# Patient Record
Sex: Female | Born: 1965 | Race: White | Hispanic: No | Marital: Married | Smoking: Current every day smoker
Health system: Southern US, Community
[De-identification: ages and names within clinical notes are randomized; demographics above are authoritative.]

## PROBLEM LIST (undated history)

## (undated) DIAGNOSIS — M51369 Other intervertebral disc degeneration, lumbar region without mention of lumbar back pain or lower extremity pain: Secondary | ICD-10-CM

## (undated) DIAGNOSIS — E559 Vitamin D deficiency, unspecified: Secondary | ICD-10-CM

## (undated) DIAGNOSIS — F329 Major depressive disorder, single episode, unspecified: Secondary | ICD-10-CM

## (undated) DIAGNOSIS — M1990 Unspecified osteoarthritis, unspecified site: Secondary | ICD-10-CM

## (undated) DIAGNOSIS — G5600 Carpal tunnel syndrome, unspecified upper limb: Secondary | ICD-10-CM

## (undated) DIAGNOSIS — M5136 Other intervertebral disc degeneration, lumbar region: Secondary | ICD-10-CM

## (undated) DIAGNOSIS — E785 Hyperlipidemia, unspecified: Secondary | ICD-10-CM

## (undated) DIAGNOSIS — R232 Flushing: Secondary | ICD-10-CM

## (undated) DIAGNOSIS — M5126 Other intervertebral disc displacement, lumbar region: Secondary | ICD-10-CM

## (undated) HISTORY — PX: ABDOMINAL HYSTERECTOMY: SHX81

## (undated) HISTORY — DX: Unspecified osteoarthritis, unspecified site: M19.90

## (undated) HISTORY — DX: Other intervertebral disc displacement, lumbar region: M51.26

## (undated) HISTORY — PX: SALPINGOOPHORECTOMY: SHX82

## (undated) HISTORY — DX: Major depressive disorder, single episode, unspecified: F32.9

## (undated) HISTORY — DX: Flushing: R23.2

## (undated) HISTORY — DX: Other intervertebral disc degeneration, lumbar region without mention of lumbar back pain or lower extremity pain: M51.369

## (undated) HISTORY — DX: Hyperlipidemia, unspecified: E78.5

## (undated) HISTORY — DX: Other intervertebral disc degeneration, lumbar region: M51.36

## (undated) HISTORY — DX: Vitamin D deficiency, unspecified: E55.9

---

## 2001-05-26 ENCOUNTER — Encounter: Payer: Self-pay | Admitting: Obstetrics and Gynecology

## 2001-05-26 ENCOUNTER — Ambulatory Visit (HOSPITAL_COMMUNITY): Admission: RE | Admit: 2001-05-26 | Discharge: 2001-05-26 | Payer: Self-pay | Admitting: Obstetrics and Gynecology

## 2001-06-06 ENCOUNTER — Ambulatory Visit (HOSPITAL_COMMUNITY): Admission: RE | Admit: 2001-06-06 | Discharge: 2001-06-06 | Payer: Self-pay | Admitting: General Surgery

## 2002-01-02 ENCOUNTER — Other Ambulatory Visit: Admission: RE | Admit: 2002-01-02 | Discharge: 2002-01-02 | Payer: Self-pay | Admitting: Obstetrics and Gynecology

## 2007-02-25 ENCOUNTER — Ambulatory Visit (HOSPITAL_COMMUNITY): Admission: RE | Admit: 2007-02-25 | Discharge: 2007-02-25 | Payer: Self-pay | Admitting: Obstetrics and Gynecology

## 2007-05-15 ENCOUNTER — Inpatient Hospital Stay (HOSPITAL_COMMUNITY): Admission: RE | Admit: 2007-05-15 | Discharge: 2007-05-17 | Payer: Self-pay | Admitting: Obstetrics and Gynecology

## 2007-05-15 ENCOUNTER — Encounter: Payer: Self-pay | Admitting: Obstetrics and Gynecology

## 2011-03-13 NOTE — Op Note (Signed)
NAME:  Smyre, Fani                  ACCOUNT NO.:  [id number]   MEDICAL RECORD NO.:  [id number]          PATIENT TYPE:  INP   LOCATION:  A308                          FACILITY:  [facility]   PHYSICIAN:  Tilda Burrow, M.D. DATE OF BIRTH:  03/27/1966   DATE OF PROCEDURE:  DATE OF DISCHARGE:                               OPERATIVE REPORT   PREOPERATIVE DIAGNOSIS:  Pelvic pain, menorrhagia, stress urinary  incontinence.   POSTOPERATIVE DIAGNOSES:  1. Pelvic pain, menorrhagia, stress urinary incontinence.  2. Minimal endometriosis, bladder flap, and left ovary.   PROCEDURE:  Total abdominal hysterectomy with left salpingo-  oophorectomy.   SURGEON:  Tilda Burrow, M.D.   Burch urethropexy, Dr. Dennie Maizes, dictated elsewhere.   ANESTHESIA:  General.   COMPLICATIONS:  None.   FINDINGS:  Mobile uterus with adhesions from enlarged left ovary to the  back of the uterus on the left, visibly normal right tube and ovary with  good mobility.  Cul de sac normal without endometriosis implants.  Bladder flap showing endometriosis, removed with specific.   DESCRIPTION OF PROCEDURE:  Patient was taken to the operating room,  prepped and draped for the combined abdominal and vaginal procedure with  yellow fin low lithotomy leg support.  A Pfannenstiel-type incision was  performed with wide excision of the old cicatrix, removing a 4-5 cm wide  ellipse of skin x30 cm in length.  This allowed improved visibility and  access for the Burch urethropexy.  The Pfannenstiel incision was opened  transversely in a standard fashion, the rectus muscles split in the  midline, the peritoneal cavity entered carefully while elevating the  abdominal wall.  Bowel packed away with three laparotomy drapes, and a  Balfour retractor placed in an inverted position.  The uterus could be  grasped with Lahey thyroid tenaculum, elevated round ligaments taken  down individually, and bladder flap developed, being  careful to leave  the endometriosis implants that had likely occurred at the time of the  cesarean sections, leaving that as a part of the surgical specimen.  The  bladder was dissected down.  There was a line of dense adhesions to the  back of the bladder that required careful dissection, peeling the  bladder off of the anterior uterus.  The left infundibulopelvic ligament  was isolated and cross-clamped well away from the pelvic side wall, then  suture-ligated.  The right utero-ovarian ligament was similarly  isolated, clamped, cut, and suture-ligated with 0 chromic.  The uterine  vessels were then skeletonized, cross-clamped with a curved Heaney  clamp, a Kelly clamp was used for back-bleeding, transected, and 0  chromic suture ligature used for the suturing.  The upper and lower  cardinal ligaments were then serially clamped, cut, and suture-ligated  using a straight Heaney clamp, knife for transection, and 0 chromic  suture ligature.  There were generous varicosities in the bladder flap  area, some of which required point cautery, being careful to stay away  from the bladder and stay on the surface of the lower uterine segment.  We marched down to the  level of the cervix.  A stab incision could be  made in the anterior cervical vaginal fornix, and the cervix amputated  off the vaginal cuff.  The Aldridge stitch on the left side was used to  reattach the lower cardinal ligaments to the vaginal cuff and on the  right side, attachments were more naturally dense.  The vaginal cuff  itself was thick in the posterior area.  A figure-of-eight suture was  placed at 6 o'clock on the cuff to reduce the posterior circumference  and to develop a vaginal pouch posterior to the incision scar that is to  develop.  The remainder of the cuff was closed in a running, locking  fashion using 0 chromic with good tissue approximation.  Two figure-of-  eight sutures overlying the running, locking suture  were necessary for  hemostasis.  Careful attention was made for hemostasis.  There were  several varices that required individual attention with point cautery,  being careful to stay away from the bladder.  Patient then had  inspection of the pelvis.  The right utero-ovarian ligature was re-sewn  due to some oozing and some Bovie cautery used on the mesosalpinx on  this side to achieve adequate hemostasis.  The pelvis was irrigated, and  hemostasis confirmed.  The bladder flap was tacked down with two  interrupted 2-0 chromic sutures overlying the vaginal cuff.  The  laparotomy equipment was removed.  Sponge and needle counts were  correct.  The anterior peritoneum was closed with 2-0 chromic and rectus  muscles left separated so that Burch urethropexy could be developed.  Dr. Rito Ehrlich then took over the surgery, and the Burch urethropexy  procedure was performed, as described in his notes.   Upon completion of Dr. Chancy Milroy portion of the case, I resumed  management by pulling the rectus muscles together, and the fibrous  tissue between the pyramidalis muscles was pulled together with  interrupted 2-0 chromic, and the rectus muscles pulled together  similarly.  The fascia was pulled down, trimmed of some irregular edges,  and then fascia reapproximated with 0 Vicryl in a continuous running  fashion with good tissue edge approximation.  Subcu fatty tissues were  irrigated, confirmed as hemostatic, reapproximated using interrupted 2-0  chromic in two layers in the subcu fatty space with J-P drain placed  just above the fascia and allowed to exit through the incision on the  left side.  Once the 2-0 plain had pulled the fatty tissues into a good  approximation, staple closure of the skin completed the procedure.  Suturing of the J-P drain was done on the left lower quadrant and placed  to bulb suction.  Estimated blood loss 300 cc.      Tilda Burrow, M.D.  Electronically  Signed     JVF/MEDQ  D:  05/15/2007  T:  05/15/2007  Job:  [id number]   cc:   Dennie Maizes, M.D.  Fax: [phone]

## 2011-03-13 NOTE — Discharge Summary (Signed)
NAME:  Judith Rodriguez, Judith Rodriguez                  ACCOUNT NO.:  [id number]   MEDICAL RECORD NO.:  [id number]          PATIENT TYPE:  INP   LOCATION:  A308                          FACILITY:  [facility]   PHYSICIAN:  Lazaro Arms, M.D.   DATE OF BIRTH:  [birth year]   DATE OF ADMISSION:  05/15/2007  DATE OF DISCHARGE:  07/19/2008LH                               DISCHARGE SUMMARY   DISCHARGE DIAGNOSIS:  1. Status post abdominal hysterectomy and Burch urethropexy.  2. Unremarkable postoperative course.   PROCEDURE:  1. Abdominal hysterectomy by Dr. Emelda Fear.  2. Burch urethropexy by Dr. Rito Ehrlich.   Please refer to the history and physical and the surgical note for  details of admission to the hospital.   HOSPITAL COURSE:  The patient was admitted postoperatively.  She had no  complications intraoperatively.  Postop, she has done well, tolerating  clear liquids and a regular diet.  She has voided without symptoms and  is extensively ambulatory.  Her incision is clean, dry, and intact.  Her  abdominal exam is benign.  She has remained afebrile.  Her JP drain is  in place and putting out about 30 mL a day of blood tinged  serosanguineous fluid.  It will be left in place when she is discharged.  The patient is discharged to home on Percocet 5/325, #20, Cipro 500 mg  one a day, a catheter in place, JP in place, and she has requested  Ambien 10 mg for sleep.  The patient is discharged to home to follow up  in the office next Thursday, she is given aftercare instructions and  instructions for contact in the office.      Lazaro Arms, M.D.  Electronically Signed     LHE/MEDQ  D:  05/17/2007  T:  05/17/2007  Job:  [id number]

## 2011-03-13 NOTE — Consult Note (Signed)
NAME:  Judith Rodriguez, Judith Rodriguez                  ACCOUNT NO.:  [id number]   MEDICAL RECORD NO.:  [id number]          PATIENT TYPE:  INP   LOCATION:  A308                          FACILITY:  [facility]   PHYSICIAN:  Dennie Maizes, M.D.   DATE OF BIRTH:  [birth year]   DATE OF CONSULTATION:  05/14/2007  DATE OF DISCHARGE:                                 CONSULTATION   CHIEF COMPLAINT:  Urinary leakage during coughing and sneezing,  menorrhagia, pelvic pain.   HISTORY OF PRESENT ILLNESS:  This 45 year old female was referred to me  by Dr. Emelda Fear.  She had irregular periods as well as left lower  quadrant abdominal pain and pelvic pain.  She had been evaluated by Dr.  Emelda Fear and scheduled to undergo an abdominal hysterectomy.   The patient also has significant mixed type of urinary incontinence.  She has had urinary leakage for the past two years which is getting  worse at present.  She has urinary leakage during coughing, sneezing,  exercise, and sex.  She has urinary frequency times 6-8 and nocturia  times zero.  She has urinary urgency and occasional urge urinary  incontinence.  She feels the stress urinary incontinence is more  troublesome than the urge urinary incontinence.  There is no history of  hematuria, urolithiasis, or urinary tract infections.   PAST MEDICAL HISTORY:  No medical illnesses, status post cesarean  section ten years ago, status post tubal ligation.   SOCIAL HISTORY:  The patient is married.  She has two children, 95 and  36 years old.   MEDICATIONS:  Darvocet and Benadryl.   ALLERGIES:  Aspirin.   FAMILY HISTORY:  The family history is positive for carcinoma of the  lung, other cancer, coronary artery disease, and myocardial infarction.   PHYSICAL EXAMINATION:  VITAL SIGNS:  Height is 5 feet 4 inches, weight  of 155 pounds.  HEENT:  Normal.  NECK:  No masses.  LUNGS:  The lungs are clear to auscultation.  HEART:  Regular rate and rhythm, no murmurs.  ABDOMEN:  The  abdomen is soft.  There is no palpable flank muscle CVA  tenderness.  Mild left lower quadrant abdominal tenderness is noted.  PELVIC:  A small rectocele is noted.  There is no pelvic mass.   INVESTIGATIONS:  The patient has undergone further evaluation in the  office with CMG and cystoscopy.  The first urge of urine is 50 cc.  The  CMG revealed normal bladder sensations and the patient could feel the  filling of the bladder, 145 cc.  She developed a desire to urge at a  volume of 247 cc and the bladder capacity was 305 cc.  There was no  bladder contractions.  The leak point pressure was more than 170 meters  of water and she had urinary leakage at supine as well as upright  positions.  Cystoscopy was essentially normal.  The trigone, ureteral  orifices, and bladder mucosa were unremarkable.   IMPRESSION:  Stress urinary incontinence, urinary urgency, urge  incontinence.   PLAN:  I have discussed with the patient  regarding the treatment options  of mixed type of urinary incontinence.  As she is undergoing an  abdominal hysterectomy, I plan to do a Burch vesico urethropexy at the  same time.  I have informed the patient regarding the diagnosis,  operative details, alternative treatments, outcomes, possible risks and  complications, and she has agreed for the procedure to be done.  Infection, bleeding, postoperative urinary retention, persistent urinary  incontinence were discussed with the patient.  She may need medical  treatment for persistent urinary urgency and urge urinary incontinence  even after the surgery.  She has been informed about this possibility.  Her questions have been answered and she has agreed for the procedure to  be done.      Dennie Maizes, M.D.  Electronically Signed     SK/MEDQ  D:  05/14/2007  T:  05/14/2007  Job:  [id number]   cc:   Dennie Maizes, M.D.  Fax: [phone]   Tilda Burrow, M.D.  Fax: [phone]

## 2011-03-13 NOTE — Op Note (Signed)
NAME:  Judith Rodriguez, Judith Rodriguez                  ACCOUNT NO.:  [id number]   MEDICAL RECORD NO.:  [id number]          PATIENT TYPE:  INP   LOCATION:  A308                          FACILITY:  [facility]   PHYSICIAN:  Dennie Maizes, M.D.   DATE OF BIRTH:  Mar 14, 1966   DATE OF PROCEDURE:  05/15/2007  DATE OF DISCHARGE:                               OPERATIVE REPORT   PREOPERATIVE DIAGNOSES:  Stress urinary incontinence.   POSTOPERATIVE DIAGNOSES:  Stress urinary incontinence.   OPERATIVE PROCEDURE:  Burch vesico urethropexy.   ANESTHESIA:  General.   SURGEON:  Dennie Maizes, M.D.   Threasa HeadsEmelda Fear.   ESTIMATED BLOOD LOSS:  Minimal.   DRAINS:  #16 French Foley catheter in the bladder.   INDICATIONS FOR PROCEDURE:  This 46 year old female had  menorrhagia and  pelvic pain.  She was scheduled to undergo abdominal hysterectomy with  left salpingo-oophorectomy by Dr. Emelda Fear.  She also had troublesome  stress urinary incontinence.  I planned to do a Burch vesicol  urethropexy under the same anesthesia.   DESCRIPTION OF PROCEDURE:  General anesthesia was induced and the  patient was placed on the OR table in the low lithotomy position.  The  abdomen and genitalia were prepped and draped in a sterile fashion.  A  #16 French Foley catheter was inserted into the bladder.  Through a  Pfannenstiel incision, Dr. Emelda Fear proceeded with the abdominal  hysterectomy and the salpingo-oophorectomy.  I assisted Dr. Emelda Fear for  this procedure.  After this, the peritoneum was closed.   The presacral space was entered and anterior surface of the bladder  separated from the pubic symphysis and pubic rami with sharp and blunt  dissection.  The bladder was identified by palpating the Foley balloon.  The left hand was then placed in the vagina.  The right vaginal fornix  was then elevated.  The lateral border of the bladder was identified and  rolled medially.  The paravaginal fascia was then identified and  the  bladder separated from the fascia.  A 0 Prolene suture was then inserted  through the paravaginal fascia, also the lateral iliopectineal ligament.  A second  suture was placed in a similar fashion on the left side.  The sutures  were then tied snugly, resulting in elevation of the bladder base.   There was no active bleeding at this time.  Dr. Emelda Fear then proceeded  with closure of the abdominal incision.      Dennie Maizes, M.D.  Electronically Signed     SK/MEDQ  D:  05/15/2007  T:  05/16/2007  Job:  [id number]   cc:   Tilda Burrow, M.D.  Fax: [phone]

## 2011-03-13 NOTE — H&P (Signed)
NAME:  Judith Rodriguez, Judith Rodriguez                  ACCOUNT NO.:  [id number]   MEDICAL RECORD NO.:  [id number]          PATIENT TYPE:  AMB   LOCATION:  DAY                           FACILITY:  [facility]   PHYSICIAN:  Tilda Burrow, M.D. DATE OF BIRTH:  1966/10/17   DATE OF ADMISSION:  05/07/2007  DATE OF DISCHARGE:  LH                              HISTORY & PHYSICAL   ADMISSION DIAGNOSES:  1. Pelvic pain.  2. Menorrhagia.  3. Urinary leakage.   HISTORY OF PRESENT ILLNESS:  This 45 year old female is admitted for  abdominal hysterectomy and accompanying Burch urethropexy.  [place] has  been seen in our office for a left lower quadrant discomfort as well as  heavy menses interfering with normal activities.  She has been able to  maintain adequate replacement of her hemoglobin by iron therapy.  She  has intermittent bursts of bleeding during the cycle with pads soaked as  low as 15 minutes using 7 pads per day during the cycle.  She had an  ultrasound which measured slightly large uterus with an estimated weight  of 130 g.  Ovaries are normal in size.  There is a left ovarian  tenderness that has been a necrotic problem with the ovaries, normal in  size on the ultrasound.  She additionally has a problem with stress  incontinence, being unable to sneeze or perform physical activity  without urine loss.  She has been evaluated by Dr. Rito Ehrlich who has seen  her and evaluated her and considers her a candidate for a suburethral  sling procedure.  Since the case is being done as an abdominal case, it  is likely to be a Burch urethropexy that is performed.  See Dr.  Chancy Milroy notes regarding this.   PAST MEDICAL HISTORY:  Benign.   PAST SURGICAL HISTORY:  1. Tubal ligation.  2. Cesarean sections x1.   GYNECOLOGY HISTORY:  Normal Pap smears annually including 2008.  GC/Chlamydia cultures negative.  Review of her bowel complaints are that  she has been diagnosed as having a small rectocele on clinical exam  but  is asymptomatic at present.  After discussion of options, we are going  to leave that alone until symptoms develop and address that at a  hopefully much later date.   FAMILY HISTORY:  Positive for hypertension, heart disease, lung cancer  and COPD.   PHYSICAL EXAMINATION:  GENERAL:  Height 5 feet, 4.5 inches, BMI 27,  weight 157, blood pressure 122/78, LMP was June 1-8, 2008 with recurrent  bleeding beginning July 28.  HEENT:  Pupils equal, round and reactive to light.  Extraocular  movements intact.  NECK:  Supple.  CHEST:  Clear to auscultation.  BREASTS:  Deferred.  HEART/LUNG:  Within normal limits.  ABDOMEN:  Well-healed, lower abdominal Pfannenstiel incision without  masses or hernias.  External genitalia multiparous.  Vaginal exam normal  secretions.  Cervix multiparous.  Small rectocele with no splinting or  incomplete defecation at present.  Will defer treatment for now.   PLAN:  Abdominal hysterectomy without salpingo-oophorectomy, probably  wide excision of old  cicatrix ([facility]).  Burch urethropexy by Dr. Rito Ehrlich.  Surgery to be on July 17 at 7:30.      Tilda Burrow, M.D.  Electronically Signed     JVF/MEDQ  D:  05/07/2007  T:  05/08/2007  Job:  [id number]   cc:   [facility]   Dennie Maizes, M.D.  Fax: [phone]

## 2011-08-13 LAB — CBC
HCT: 40.9
Hemoglobin: 11.5 — ABNORMAL LOW
Hemoglobin: 13.9
MCHC: 34.1
MCHC: 34.2
MCV: 95
MCV: 96.1
Platelets: 308
RBC: 3.5 — ABNORMAL LOW
RBC: 4.31
RDW: 13.2
WBC: 12.5 — ABNORMAL HIGH

## 2011-08-13 LAB — COMPREHENSIVE METABOLIC PANEL
ALT: 12
AST: 18
Albumin: 3.7
Alkaline Phosphatase: 72
BUN: 7
CO2: 21
Calcium: 9.1
Chloride: 109
Creatinine, Ser: 0.73
GFR calc Af Amer: >60
GFR calc non Af Amer: >60
Glucose, Bld: 133 — ABNORMAL HIGH
Potassium: 3.7
Sodium: 137
Total Bilirubin: 0.5
Total Protein: 6.6

## 2011-08-13 LAB — HCG, QUANTITATIVE, PREGNANCY: hCG, Beta Chain, Quant, S: <2

## 2011-08-13 LAB — DIFFERENTIAL
Basophils Relative: 0
Eosinophils Absolute: 0
Lymphs Abs: 3.1
Monocytes Absolute: 1.7 — ABNORMAL HIGH
Monocytes Relative: 10
Neutro Abs: 13.3 — ABNORMAL HIGH

## 2011-10-20 ENCOUNTER — Emergency Department (HOSPITAL_COMMUNITY)
Admission: EM | Admit: 2011-10-20 | Discharge: 2011-10-20 | Disposition: A | Discharge status: Home / Self Care | Payer: Self-pay | Attending: Emergency Medicine | Admitting: Emergency Medicine

## 2011-10-20 ENCOUNTER — Encounter: Payer: Self-pay | Admitting: *Deleted

## 2011-10-20 ENCOUNTER — Emergency Department (HOSPITAL_COMMUNITY): Payer: Self-pay

## 2011-10-20 DIAGNOSIS — R220 Localized swelling, mass and lump, head: Secondary | ICD-10-CM | POA: Insufficient documentation

## 2011-10-20 DIAGNOSIS — R059 Cough, unspecified: Secondary | ICD-10-CM | POA: Insufficient documentation

## 2011-10-20 DIAGNOSIS — R6883 Chills (without fever): Secondary | ICD-10-CM | POA: Insufficient documentation

## 2011-10-20 DIAGNOSIS — B349 Viral infection, unspecified: Secondary | ICD-10-CM

## 2011-10-20 DIAGNOSIS — R05 Cough: Secondary | ICD-10-CM | POA: Insufficient documentation

## 2011-10-20 DIAGNOSIS — IMO0001 Reserved for inherently not codable concepts without codable children: Secondary | ICD-10-CM | POA: Insufficient documentation

## 2011-10-20 DIAGNOSIS — J3489 Other specified disorders of nose and nasal sinuses: Secondary | ICD-10-CM | POA: Insufficient documentation

## 2011-10-20 DIAGNOSIS — B9789 Other viral agents as the cause of diseases classified elsewhere: Secondary | ICD-10-CM | POA: Insufficient documentation

## 2011-10-20 DIAGNOSIS — R221 Localized swelling, mass and lump, neck: Secondary | ICD-10-CM | POA: Insufficient documentation

## 2011-10-20 DIAGNOSIS — R070 Pain in throat: Secondary | ICD-10-CM | POA: Insufficient documentation

## 2011-10-20 MED ORDER — HYDROCOD POLST-CHLORPHEN POLST 10-8 MG/5ML PO LQCR
5.0000 mL | Freq: Once | ORAL | Status: AC
  Administered 2011-10-20: 5 mL via ORAL
  Filled 2011-10-20: qty 5

## 2011-10-20 MED ORDER — HYDROCOD POLST-CHLORPHEN POLST 10-8 MG/5ML PO LQCR: 5.0000 mL | Freq: Two times a day (BID) | ORAL | Status: DC | PRN

## 2011-10-20 MED ORDER — ACETAMINOPHEN 325 MG PO TABS
650.0000 mg | ORAL_TABLET | Freq: Once | ORAL | Status: AC
  Administered 2011-10-20: 650 mg via ORAL
  Filled 2011-10-20: qty 2

## 2011-10-20 NOTE — ED Notes (Signed)
Pt a/ox4. Resp even and unlabored. NAD at this time. D/C instructions reviewed with pt. Pt verbalized understanding. Pt ambulated to lobby with steady gate.  

## 2011-10-20 NOTE — ED Notes (Signed)
Pt c/o cough, congestion, aching all over and burning pain in her right back that is worse with cough.

## 2011-10-23 NOTE — ED Provider Notes (Signed)
History     CSN: [id number]  Arrival date & time 10/20/11  1735   First MD Initiated Contact with Patient 10/20/11 1823      Chief Complaint  Patient presents with  . Cough    (Consider location/radiation/quality/duration/timing/severity/associated sxs/prior treatment) Patient is a 45 y.o. female presenting with cough. The history is provided by the patient.  Cough This is a new problem. The current episode started more than 2 days ago. The problem occurs every few minutes. The problem has not changed since onset.There has been no fever. Associated symptoms include chills, rhinorrhea, sore throat and myalgias. Pertinent negatives include no chest pain, no ear pain, no headaches, no shortness of breath and no wheezing. She has tried nothing for the symptoms. The treatment provided no relief. She is a smoker. Her past medical history does not include pneumonia or asthma.    History reviewed. No pertinent past medical history.  Past Surgical History  Procedure Date  . Abdominal hysterectomy   . Cesarean section     History reviewed. No pertinent family history.  History  Substance Use Topics  . Smoking status: Current Everyday Smoker    Types: Cigarettes  . Smokeless tobacco: Not on file  . Alcohol Use: No    OB History    Grav Para Term Preterm Abortions TAB SAB Ect Mult Living                  Review of Systems  Constitutional: Positive for chills.  HENT: Positive for sore throat and rhinorrhea. Negative for ear pain, trouble swallowing, neck pain and neck stiffness.   Respiratory: Positive for cough. Negative for shortness of breath and wheezing.   Cardiovascular: Negative for chest pain.  Gastrointestinal: Negative for abdominal pain.  Musculoskeletal: Positive for myalgias. Negative for joint swelling, arthralgias and gait problem.  Neurological: Negative for dizziness, weakness and headaches.  Hematological: Does not bruise/bleed easily.  All other systems  reviewed and are negative.    Allergies  Aspirin  Home Medications   Current Outpatient Rx  Name Route Sig Dispense Refill  . HYDROCOD POLST-CHLORPHEN POLST 10-8 MG/5ML PO LQCR Oral Take 5 mLs by mouth every 12 (twelve) hours as needed. 120 mL 0    BP 150/91  Pulse 90  Temp(Src) 98 F (36.7 C) (Oral)  Resp 20  Ht 5' 4.5" (1.638 m)  Wt 165 lb (74.844 kg)  BMI 27.88 kg/m2  SpO2 100%  Physical Exam  Nursing note and vitals reviewed. Constitutional: She is oriented to person, place, and time. She appears well-developed and well-nourished. No distress.  HENT:  Head: Normocephalic and atraumatic.  Right Ear: Tympanic membrane and ear canal normal. No mastoid tenderness.  Left Ear: Tympanic membrane and ear canal normal. No mastoid tenderness.  Nose: Mucosal edema present.  Mouth/Throat: Uvula is midline, oropharynx is clear and moist and mucous membranes are normal.  Neck: Normal range of motion. Neck supple.  Cardiovascular: Normal rate, regular rhythm and normal heart sounds.   Pulmonary/Chest: Effort normal and breath sounds normal. No respiratory distress. She exhibits no tenderness.  Abdominal: Soft. She exhibits no distension. There is no tenderness.  Musculoskeletal: Normal range of motion. She exhibits no tenderness.  Lymphadenopathy:    She has no cervical adenopathy.  Neurological: She is alert and oriented to person, place, and time. No cranial nerve deficit. She exhibits normal muscle tone. Coordination normal.  Skin: Skin is warm and dry.    ED Course  Procedures (including critical care  time)   Dg Chest 2 View  10/20/2011  *RADIOLOGY REPORT*  Clinical Data: Fever, cough.  CHEST - 2 VIEW  Comparison: None  Findings: Heart and mediastinal contours are within normal limits. No focal opacities or effusions.  No acute bony abnormality.  IMPRESSION: No active cardiopulmonary disease.  Original Report Authenticated By: Cyndie Chime, M.D.     1. Viral illness         MDM     Patient is alert, vitals stable.  Non-toxic appearing.  Likely viral illness,  Pt agrees to close f/u with her PMD       Duffy Dantonio L. [place], [place] 10/23/11 [phone]

## 2011-10-24 NOTE — ED Provider Notes (Signed)
Evaluation and management procedures were performed by the PA/NP under my supervision/collaboration.   Felisa Bonier, MD 10/24/11 [phone]

## 2012-08-04 ENCOUNTER — Emergency Department (HOSPITAL_COMMUNITY)
Admission: EM | Admit: 2012-08-04 | Discharge: 2012-08-04 | Disposition: A | Discharge status: Home / Self Care | Payer: Self-pay | Attending: Emergency Medicine | Admitting: Emergency Medicine

## 2012-08-04 ENCOUNTER — Emergency Department (HOSPITAL_COMMUNITY): Payer: Self-pay

## 2012-08-04 ENCOUNTER — Encounter (HOSPITAL_COMMUNITY): Payer: Self-pay | Admitting: *Deleted

## 2012-08-04 DIAGNOSIS — D72829 Elevated white blood cell count, unspecified: Secondary | ICD-10-CM | POA: Insufficient documentation

## 2012-08-04 DIAGNOSIS — R109 Unspecified abdominal pain: Secondary | ICD-10-CM

## 2012-08-04 DIAGNOSIS — F17200 Nicotine dependence, unspecified, uncomplicated: Secondary | ICD-10-CM | POA: Insufficient documentation

## 2012-08-04 DIAGNOSIS — R1011 Right upper quadrant pain: Secondary | ICD-10-CM | POA: Insufficient documentation

## 2012-08-04 DIAGNOSIS — Z884 Allergy status to anesthetic agent status: Secondary | ICD-10-CM | POA: Insufficient documentation

## 2012-08-04 LAB — CBC WITH DIFFERENTIAL/PLATELET
Basophils Relative: 0 % (ref 0–1)
Eosinophils Absolute: 0.2 10*3/uL (ref 0.0–0.7)
Eosinophils Relative: 1 % (ref 0–5)
Hemoglobin: 14 g/dL (ref 12.0–15.0)
MCH: 32.9 pg (ref 26.0–34.0)
MCHC: 33.5 g/dL (ref 30.0–36.0)
MCV: 98.4 fL (ref 78.0–100.0)
Monocytes Relative: 5 % (ref 3–12)
Neutrophils Relative %: 53 % (ref 43–77)

## 2012-08-04 LAB — COMPREHENSIVE METABOLIC PANEL
Albumin: 3.8 g/dL (ref 3.5–5.2)
Alkaline Phosphatase: 90 U/L (ref 39–117)
BUN: 13 mg/dL (ref 6–23)
Calcium: 9.4 mg/dL (ref 8.4–10.5)
Creatinine, Ser: 0.79 mg/dL (ref 0.50–1.10)
GFR calc Af Amer: >90 mL/min (ref >90)
Glucose, Bld: 91 mg/dL (ref 70–99)
Potassium: 3.7 mEq/L (ref 3.5–5.1)
Total Protein: 7.2 g/dL (ref 6.0–8.3)

## 2012-08-04 LAB — LIPASE, BLOOD: Lipase: 34 U/L (ref 11–59)

## 2012-08-04 NOTE — ED Notes (Addendum)
Pain rt ant lower ribs "burning Pain, " seen by PA, and dx with bronchitis, and sinus infection,  Taking amoxicillin, but does not feel any better, No N/V,  Alert.

## 2012-08-04 NOTE — ED Provider Notes (Signed)
History   Scribed for [employer]. Bernette Mayers, MD, the patient was seen in room APA19/APA19 . This chart was scribed by Lewanda Rife.    CSN: [id number]  Arrival date & time 08/04/12  1748   First MD Initiated Contact with Patient 08/04/12 1835      Chief Complaint  Patient presents with  . Chest Pain    (Consider location/radiation/quality/duration/timing/severity/associated sxs/prior treatment) HPI Judith Rodriguez is a 46 y.o. female who presents to the Emergency Department complaining of moderate waxing and waning chest pain for the past 7 days. History was provided by the patient. Pt reports burning sensation under center of her ribs with radiation to her back. Pt reports pain is worse when bending over and resolved at rest. Pt states there is no change of symptoms with eating. Pt denies fever, emesis, nausea, and diarrhea. Pt was seen by PCP a few days ago for the same and prescribed Amoxacillin for sinusitis. Pt reports she smokes cigarretes. Pt reports history of hysterectomy, but denies any other significant past medical history.Pt denies taking any pain medication to treat symptoms.  History reviewed. No pertinent past medical history.  Past Surgical History  Procedure Date  . Abdominal hysterectomy   . Cesarean section     History reviewed. No pertinent family history.  History  Substance Use Topics  . Smoking status: Current Every Day Smoker    Types: Cigarettes  . Smokeless tobacco: Not on file  . Alcohol Use: No    OB History    Grav Para Term Preterm Abortions TAB SAB Ect Mult Living                  Review of Systems A complete 10 system review of systems was obtained and all systems are negative except as noted in the HPI and PMH.   Allergies  Aspirin  Home Medications   Current Outpatient Rx  Name Route Sig Dispense Refill  . HYDROCOD POLST-CPM POLST ER 10-8 MG/5ML PO LQCR Oral Take 5 mLs by mouth every 12 (twelve) hours as needed. 120 mL 0    BP  131/83  Pulse 85  Temp 98.2 F (36.8 C) (Oral)  Resp 18  Ht 5\' 4"  (1.626 m)  Wt 179 lb (81.194 kg)  BMI 30.73 kg/m2  SpO2 99%  Physical Exam  Nursing note and vitals reviewed. Constitutional: She is oriented to person, place, and time. She appears well-developed and well-nourished.  HENT:  Head: Normocephalic and atraumatic.  Eyes: EOM are normal. Pupils are equal, round, and reactive to light.  Neck: Normal range of motion. Neck supple.  Cardiovascular: Normal rate, normal heart sounds and intact distal pulses.   Pulmonary/Chest: Effort normal and breath sounds normal.  Abdominal: Soft. Bowel sounds are normal. She exhibits no distension and no mass. There is no tenderness. There is no rebound and no guarding.       Negative murphy's sign   Musculoskeletal: Normal range of motion. She exhibits no edema and no tenderness.  Neurological: She is alert and oriented to person, place, and time. She has normal strength. No cranial nerve deficit or sensory deficit.  Skin: Skin is warm and dry. No rash noted.  Psychiatric: She has a normal mood and affect.    ED Course  Procedures (including critical care time)  Labs Reviewed  CBC WITH DIFFERENTIAL - Abnormal; Notable for the following:    WBC 11.9 (*)     Lymphs Abs 4.9 (*)     All  other components within normal limits  COMPREHENSIVE METABOLIC PANEL - Abnormal; Notable for the following:    Total Bilirubin 0.2 (*)     All other components within normal limits  LIPASE, BLOOD   Dg Chest 2 View  08/04/2012  *RADIOLOGY REPORT*  Clinical Data: Cough  CHEST - 2 VIEW  Comparison: 10/20/2011  Findings: Normal heart size.  Lungs are hyperaerated.  Calcified granulomata are noted.  No new consolidation.  No pneumothorax.  No pleural effusion.  Osteopenia without compression deformity in the visualized spine.  IMPRESSION: No active cardiopulmonary disease.   Original Report Authenticated By: Donavan Burnet, M.D.      No diagnosis  found.    MDM  Labs unremarkable aside from mild leukocytosis. Pt's pain is RUQ and not chest pain. Will send home with outpatient [place].       I personally performed the services described in the documentation, which were scribed in my presence. The recorded information has been reviewed and considered.      Vanya Carberry B. Bernette Mayers, MD 08/04/12 2004

## 2012-08-05 ENCOUNTER — Ambulatory Visit (HOSPITAL_COMMUNITY)
Admit: 2012-08-05 | Discharge: 2012-08-05 | Disposition: A | Discharge status: Home / Self Care | Payer: Self-pay | Attending: Emergency Medicine | Admitting: Emergency Medicine

## 2012-08-05 DIAGNOSIS — R1011 Right upper quadrant pain: Secondary | ICD-10-CM | POA: Insufficient documentation

## 2012-08-05 NOTE — ED Provider Notes (Signed)
Pt returns for outpatient [place] of RUQ. States when she squats she gets RUQ pain.  LIMITED ABDOMINAL ULTRASOUND - RIGHT UPPER QUADRANT  Comparison: None  Findings:  Gallbladder: Normally distended without stones or wall thickening.  No pericholecystic fluid or sonographic Murphy sign.  Common bile duct: Upper normal caliber 6 mm diameter.  Liver: Normal appearance. Hepatopetal portal venous flow.  No right upper quadrant ascites identified.  Incidentally noted cortical thinning right kidney.  IMPRESSION:  No acute right upper quadrant sonographic abnormalities identified.   Discussed trying prilosec OTC and f/u with Dr Dimas Aguas.   Devoria Albe, MD, Armando Gang   Ward Givens, MD 08/05/12 [phone]

## 2013-12-23 ENCOUNTER — Encounter (INDEPENDENT_AMBULATORY_CARE_PROVIDER_SITE_OTHER): Payer: Self-pay

## 2013-12-23 ENCOUNTER — Ambulatory Visit (INDEPENDENT_AMBULATORY_CARE_PROVIDER_SITE_OTHER): Payer: BC Managed Care – PPO | Admitting: Obstetrics and Gynecology

## 2013-12-23 ENCOUNTER — Encounter: Payer: Self-pay | Admitting: Obstetrics and Gynecology

## 2013-12-23 VITALS — BP 134/90 | Ht 65.0 in | Wt 175.4 lb

## 2013-12-23 DIAGNOSIS — Z0000 Encounter for general adult medical examination without abnormal findings: Secondary | ICD-10-CM

## 2013-12-23 DIAGNOSIS — Z124 Encounter for screening for malignant neoplasm of cervix: Secondary | ICD-10-CM

## 2013-12-23 DIAGNOSIS — Z01419 Encounter for gynecological examination (general) (routine) without abnormal findings: Secondary | ICD-10-CM

## 2013-12-23 MED ORDER — ESTRADIOL 1 MG PO TABS: 1.0000 mg | ORAL_TABLET | Freq: Every day | ORAL | Status: DC

## 2013-12-23 NOTE — Progress Notes (Signed)
This chart was scribed by Jenne Campus, Medical Scribe, for Dr. Mallory Shirk on 12/23/13 at 2:44 PM. This chart was reviewed by Dr. Mallory Shirk and is accurate.  Assessment:  Annual Gyn Exam   Plan:  1. pap smear not done due s/p hysterectomy  2. return annually or prn 3    Annual mammogram advised, none since hysterectomy >8 years 4    Pelvic U/S in 2 weeks for LLQ fullness and tenderness 5    Estradiol 1mg  QD Subjective:  Judith Rodriguez is a 48 y.o. female s/p hysterectomy with left oophorectomy on file. who presents for annual exam. No LMP recorded. Patient has had a hysterectomy. The patient has complaints today of mood swings, decreased sexual desire and hot flashes. Also having LLQ pain "period-like" 2 weeks.  The following portions of the patient's history were reviewed and updated as appropriate: allergies, current medications, past family history, past medical history, past social history, past surgical history and problem list.  Review of Systems Constitutional: positive for hot flashes Gastrointestinal: negative Genitourinary: negative +mood swings, decreased libido  Objective:  There were no vitals taken for this visit.   BMI: There is no weight on file to calculate BMI.  Chaperone present for exam. Exam was performed with pt's permission w/o complications or severe discomfort.  General Appearance: Alert, appropriate appearance for age. No acute distress HEENT: Grossly normal Neck / Thyroid:  Cardiovascular: RRR; normal S1, S2, no murmur Lungs: CTA bilaterally Back: No CVAT Breast Exam: No dimpling, nipple retraction or discharge. No masses or nodes., Normal to inspection and Normal breast tissue bilaterally Gastrointestinal: Soft, non-tender, no masses or organomegaly Pelvic Exam: External genitalia: normal general appearance Urinary system: urethral meatus normal Vaginal: normal without tenderness, induration or masses Cervix: absent, well healed cuff, smooth,  mobile support adequate Adnexa: absent, left, fullness on left side noted with tenderness, ?mass, right is present and non-tender Uterus: absent Good anterior support noted  Lymphatic Exam: Non-palpable nodes in neck, clavicular, axillary, or inguinal regions  Skin: no rash or abnormalities Neurologic: Normal gait and speech, no tremor  Psychiatric: Alert and oriented, appropriate affect. [place] value 26, post menopausal   Urinalysis:Not done  Mallory Shirk. MD Pgr [phone] 2:50 PM

## 2013-12-23 NOTE — Patient Instructions (Addendum)

## 2014-01-05 ENCOUNTER — Other Ambulatory Visit: Payer: Self-pay | Admitting: Obstetrics and Gynecology

## 2014-01-05 DIAGNOSIS — R198 Other specified symptoms and signs involving the digestive system and abdomen: Secondary | ICD-10-CM

## 2014-01-06 ENCOUNTER — Encounter: Payer: Self-pay | Admitting: Obstetrics and Gynecology

## 2014-01-06 ENCOUNTER — Ambulatory Visit (INDEPENDENT_AMBULATORY_CARE_PROVIDER_SITE_OTHER): Payer: BC Managed Care – PPO | Admitting: Obstetrics and Gynecology

## 2014-01-06 ENCOUNTER — Ambulatory Visit (INDEPENDENT_AMBULATORY_CARE_PROVIDER_SITE_OTHER): Payer: BC Managed Care – PPO

## 2014-01-06 VITALS — BP 120/76 | Ht 64.5 in | Wt 172.0 lb

## 2014-01-06 DIAGNOSIS — R1031 Right lower quadrant pain: Secondary | ICD-10-CM

## 2014-01-06 DIAGNOSIS — R198 Other specified symptoms and signs involving the digestive system and abdomen: Secondary | ICD-10-CM

## 2014-01-06 DIAGNOSIS — G8929 Other chronic pain: Secondary | ICD-10-CM | POA: Insufficient documentation

## 2014-01-06 NOTE — Patient Instructions (Signed)
Make a pain calender over the next month. Mark down what worsens or improves your pain and how often you have pain.

## 2014-01-06 NOTE — Progress Notes (Signed)
This chart was scribed by Jenne Campus, Medical Scribe, for Dr. Mallory Shirk on 01/06/14 at 3:00 PM. This chart was reviewed by Dr. Mallory Shirk and is accurate.    [facility] Visit  Patient name: Judith Rodriguez MRN [id number]  Date of birth: 24-May-1966  CC & HPI:  Judith Rodriguez is a 48 y.o. female presenting today for transvaginal U/S done on 01/05/14.   Vaginal cuff appears WNL, Rt ovary appears WNL with 2.2 x 6.3KZ simple follicular cyst noted, Lt adnexa with loop of Prominent bowel noted (tender to palp with vaginal probe)  Pt reports ongoing RLQ pain. + incomplete emptying of bowels. Pain increased with straining. G2P2.   ROS:  + RLQ pain, incomplete emptying of bowels Denies anal incontinence   Pertinent History Reviewed:  Medical & Surgical Hx:  Reviewed: Significant for abdominal hysterectomy, left salping oophorectomy and c-section Medications: Reviewed & Updated - see associated section Social History: Reviewed -  reports that she has been smoking Cigarettes.  She has been smoking about 1.00 pack per day. She has never used smokeless tobacco.  Objective Findings:  Vitals: BP 120/76  Ht 5' 4.5" (1.638 m)  Wt 172 lb (78.019 kg)  BMI 29.08 kg/m2  Physical Examination: Not indicated    Assessment & Plan:  A: Ongoing RLQ pain with unknown ideology. Reviewed U/s with pt.  U/S shows a simple cyst on right side, Normal sigmoid colon on the left side  P: F/U PRN. Pain calender x1 month

## 2014-06-28 DIAGNOSIS — Z720 Tobacco use: Secondary | ICD-10-CM | POA: Insufficient documentation

## 2015-02-24 ENCOUNTER — Emergency Department (HOSPITAL_COMMUNITY)
Admission: EM | Admit: 2015-02-24 | Discharge: 2015-02-24 | Disposition: A | Discharge status: Home / Self Care | Payer: 59 | Attending: Emergency Medicine | Admitting: Emergency Medicine

## 2015-02-24 ENCOUNTER — Emergency Department (HOSPITAL_COMMUNITY): Payer: 59

## 2015-02-24 ENCOUNTER — Encounter (HOSPITAL_COMMUNITY): Payer: Self-pay

## 2015-02-24 DIAGNOSIS — Y998 Other external cause status: Secondary | ICD-10-CM | POA: Diagnosis not present

## 2015-02-24 DIAGNOSIS — Z8669 Personal history of other diseases of the nervous system and sense organs: Secondary | ICD-10-CM | POA: Diagnosis not present

## 2015-02-24 DIAGNOSIS — Y9389 Activity, other specified: Secondary | ICD-10-CM | POA: Insufficient documentation

## 2015-02-24 DIAGNOSIS — S161XXA Strain of muscle, fascia and tendon at neck level, initial encounter: Secondary | ICD-10-CM | POA: Diagnosis not present

## 2015-02-24 DIAGNOSIS — Z720 Tobacco use: Secondary | ICD-10-CM | POA: Insufficient documentation

## 2015-02-24 DIAGNOSIS — S39012A Strain of muscle, fascia and tendon of lower back, initial encounter: Secondary | ICD-10-CM | POA: Diagnosis not present

## 2015-02-24 DIAGNOSIS — Z79899 Other long term (current) drug therapy: Secondary | ICD-10-CM | POA: Diagnosis not present

## 2015-02-24 DIAGNOSIS — S3992XA Unspecified injury of lower back, initial encounter: Secondary | ICD-10-CM | POA: Diagnosis present

## 2015-02-24 DIAGNOSIS — Y92410 Unspecified street and highway as the place of occurrence of the external cause: Secondary | ICD-10-CM | POA: Diagnosis not present

## 2015-02-24 HISTORY — DX: Carpal tunnel syndrome, unspecified upper limb: G56.00

## 2015-02-24 MED ORDER — IBUPROFEN 400 MG PO TABS
600.0000 mg | ORAL_TABLET | Freq: Once | ORAL | Status: AC
  Administered 2015-02-24: 600 mg via ORAL
  Filled 2015-02-24: qty 2

## 2015-02-24 MED ORDER — DICLOFENAC SODIUM 50 MG PO TBEC
50.0000 mg | DELAYED_RELEASE_TABLET | Freq: Two times a day (BID) | ORAL | Status: DC
Start: 2015-02-24 — End: 2015-12-01

## 2015-02-24 MED ORDER — CYCLOBENZAPRINE HCL 10 MG PO TABS: 10.0000 mg | ORAL_TABLET | Freq: Two times a day (BID) | ORAL | Status: DC | PRN
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
## 2015-02-24 NOTE — Discharge Instructions (Signed)
Take the medication as directed. Do not take the muscle relaxant if driving as it will make you sleepy. Return as needed for problems

## 2015-10-14 ENCOUNTER — Other Ambulatory Visit: Payer: Self-pay | Admitting: Orthopaedic Surgery

## 2015-10-14 DIAGNOSIS — M5442 Lumbago with sciatica, left side: Secondary | ICD-10-CM

## 2015-10-17 ENCOUNTER — Ambulatory Visit (HOSPITAL_COMMUNITY)
Admission: RE | Admit: 2015-10-17 | Discharge: 2015-10-17 | Disposition: A | Discharge status: Home / Self Care | Payer: 59 | Source: Ambulatory Visit | Attending: Orthopaedic Surgery | Admitting: Orthopaedic Surgery

## 2015-10-17 DIAGNOSIS — M5442 Lumbago with sciatica, left side: Secondary | ICD-10-CM

## 2015-11-02 ENCOUNTER — Ambulatory Visit (HOSPITAL_COMMUNITY): Payer: 59

## 2015-11-03 ENCOUNTER — Ambulatory Visit (HOSPITAL_COMMUNITY): Payer: 59

## 2015-12-01 ENCOUNTER — Ambulatory Visit (INDEPENDENT_AMBULATORY_CARE_PROVIDER_SITE_OTHER): Payer: BLUE CROSS/BLUE SHIELD | Admitting: Adult Health

## 2015-12-01 ENCOUNTER — Encounter: Payer: Self-pay | Admitting: Adult Health

## 2015-12-01 VITALS — BP 130/82 | HR 88 | Ht 63.75 in | Wt 166.5 lb

## 2015-12-01 DIAGNOSIS — R232 Flushing: Secondary | ICD-10-CM

## 2015-12-01 DIAGNOSIS — Z90710 Acquired absence of both cervix and uterus: Secondary | ICD-10-CM

## 2015-12-01 DIAGNOSIS — F329 Major depressive disorder, single episode, unspecified: Secondary | ICD-10-CM

## 2015-12-01 DIAGNOSIS — F32A Depression, unspecified: Secondary | ICD-10-CM

## 2015-12-01 DIAGNOSIS — N951 Menopausal and female climacteric states: Secondary | ICD-10-CM

## 2015-12-01 DIAGNOSIS — Z139 Encounter for screening, unspecified: Secondary | ICD-10-CM

## 2015-12-01 HISTORY — DX: Depression, unspecified: F32.A

## 2015-12-01 HISTORY — DX: Flushing: R23.2

## 2015-12-01 MED ORDER — CITALOPRAM HYDROBROMIDE 20 MG PO TABS: 20.0000 mg | ORAL_TABLET | Freq: Every day | ORAL | Status: DC

## 2015-12-01 NOTE — Patient Instructions (Addendum)
Major Depressive Disorder Major depressive disorder is a mental illness. It also may be called clinical depression or unipolar depression. Major depressive disorder usually causes feelings of sadness, hopelessness, or helplessness. Some people with this disorder do not feel particularly sad but lose interest in doing things they used to enjoy (anhedonia). Major depressive disorder also can cause physical symptoms. It can interfere with work, school, relationships, and other normal everyday activities. The disorder varies in severity but is longer lasting and more serious than the sadness we all feel from time to time in our lives. Major depressive disorder often is triggered by stressful life events or major life changes. Examples of these triggers include divorce, loss of your job or home, a move, and the death of a family member or close friend. Sometimes this disorder occurs for no obvious reason at all. People who have family members with major depressive disorder or bipolar disorder are at higher risk for developing this disorder, with or without life stressors. Major depressive disorder can occur at any age. It may occur just once in your life (single episode major depressive disorder). It may occur multiple times (recurrent major depressive disorder). SYMPTOMS People with major depressive disorder have either anhedonia or depressed mood on nearly a daily basis for at least 2 weeks or longer. Symptoms of depressed mood include:  Feelings of sadness (blue or down in the dumps) or emptiness.  Feelings of hopelessness or helplessness.  Tearfulness or episodes of crying (may be observed by others).  Irritability (children and adolescents). In addition to depressed mood or anhedonia or both, people with this disorder have at least four of the following symptoms:  Difficulty sleeping or sleeping too much.   Significant change (increase or decrease) in appetite or weight.   Lack of energy or  motivation.  Feelings of guilt and worthlessness.   Difficulty concentrating, remembering, or making decisions.  Unusually slow movement (psychomotor retardation) or restlessness (as observed by others).   Recurrent wishes for death, recurrent thoughts of self-harm (suicide), or a suicide attempt. People with major depressive disorder commonly have persistent negative thoughts about themselves, other people, and the world. People with severe major depressive disorder may experiencedistorted beliefs or perceptions about the world (psychotic delusions). They also may see or hear things that are not real (psychotic hallucinations). DIAGNOSIS Major depressive disorder is diagnosed through an assessment by your health care provider. Your health care provider will ask aboutaspects of your daily life, such as mood,sleep, and appetite, to see if you have the diagnostic symptoms of major depressive disorder. Your health care provider may ask about your medical history and use of alcohol or drugs, including prescription medicines. Your health care provider also may do a physical exam and blood work. This is because certain medical conditions and the use of certain substances can cause major depressive disorder-like symptoms (secondary depression). Your health care provider also may refer you to a mental health specialist for further evaluation and treatment. TREATMENT It is important to recognize the symptoms of major depressive disorder and seek treatment. The following treatments can be prescribed for this disorder:   Medicine. Antidepressant medicines usually are prescribed. Antidepressant medicines are thought to correct chemical imbalances in the brain that are commonly associated with major depressive disorder. Other types of medicine may be added if the symptoms do not respond to antidepressant medicines alone or if psychotic delusions or hallucinations occur.  Talk therapy. Talk therapy can be  helpful in treating major depressive disorder by providing   support, education, and guidance. Certain types of talk therapy also can help with negative thinking (cognitive behavioral therapy) and with relationship issues that trigger this disorder (interpersonal therapy). A mental health specialist can help determine which treatment is best for you. Most people with major depressive disorder do well with a combination of medicine and talk therapy. Treatments involving electrical stimulation of the brain can be used in situations with extremely severe symptoms or when medicine and talk therapy do not work over time. These treatments include electroconvulsive therapy, transcranial magnetic stimulation, and vagal nerve stimulation.   This information is not intended to replace advice given to you by your health care provider. Make sure you discuss any questions you have with your health care provider.   Document Released: 02/09/2013 Document Revised: 11/05/2014 Document Reviewed: 02/09/2013 Elsevier Interactive Patient Education 2016 [place] 1 daily Follow up in 4 weeks  Menopause Menopause is the normal time of life when menstrual periods stop completely. Menopause is complete when you have missed 12 consecutive menstrual periods. It usually occurs between the ages of 53 years and 44 years. Very rarely does a woman develop menopause before the age of 51 years. At menopause, your ovaries stop producing the female hormones estrogen and progesterone. This can cause undesirable symptoms and also affect your health. Sometimes the symptoms may occur 4-5 years before the menopause begins. There is no relationship between menopause and:  Oral contraceptives.  Number of children you had.  Race.  The age your menstrual periods started (menarche). Heavy smokers and very thin women may develop menopause earlier in life. CAUSES  The ovaries stop producing the female hormones estrogen and  progesterone.  Other causes include:  Surgery to remove both ovaries.  The ovaries stop functioning for no known reason.  Tumors of the pituitary gland in the brain.  Medical disease that affects the ovaries and hormone production.  Radiation treatment to the abdomen or pelvis.  Chemotherapy that affects the ovaries. SYMPTOMS   Hot flashes.  Night sweats.  Decrease in sex drive.  Vaginal dryness and thinning of the vagina causing painful intercourse.  Dryness of the skin and developing wrinkles.  Headaches.  Tiredness.  Irritability.  Memory problems.  Weight gain.  Bladder infections.  Hair growth of the face and chest.  Infertility. More serious symptoms include:  Loss of bone (osteoporosis) causing breaks (fractures).  Depression.  Hardening and narrowing of the arteries (atherosclerosis) causing heart attacks and strokes. DIAGNOSIS   When the menstrual periods have stopped for 12 straight months.  Physical exam.  Hormone studies of the blood. TREATMENT  There are many treatment choices and nearly as many questions about them. The decisions to treat or not to treat menopausal changes is an individual choice made with your health care provider. Your health care provider can discuss the treatments with you. Together, you can decide which treatment will work best for you. Your treatment choices may include:   Hormone therapy (estrogen and progesterone).  Non-hormonal medicines.  Treating the individual symptoms with medicine (for example antidepressants for depression).  Herbal medicines that may help specific symptoms.  Counseling by a psychiatrist or psychologist.  Group therapy.  Lifestyle changes including:  Eating healthy.  Regular exercise.  Limiting caffeine and alcohol.  Stress management and meditation.  No treatment. HOME CARE INSTRUCTIONS   Take the medicine your health care provider gives you as directed.  Get plenty of  sleep and rest.  Exercise regularly.  Eat a diet  that contains calcium (good for the bones) and soy products (acts like estrogen hormone).  Avoid alcoholic beverages.  Do not smoke.  If you have hot flashes, dress in layers.  Take supplements, calcium, and vitamin D to strengthen bones.  You can use over-the-counter lubricants or moisturizers for vaginal dryness.  Group therapy is sometimes very helpful.  Acupuncture may be helpful in some cases. SEEK MEDICAL CARE IF:   You are not sure you are in menopause.  You are having menopausal symptoms and need advice and treatment.  You are still having menstrual periods after age 77 years.  You have pain with intercourse.  Menopause is complete (no menstrual period for 12 months) and you develop vaginal bleeding.  You need a referral to a specialist (gynecologist, psychiatrist, or psychologist) for treatment. SEEK IMMEDIATE MEDICAL CARE IF:   You have severe depression.  You have excessive vaginal bleeding.  You fell and think you have a broken bone.  You have pain when you urinate.  You develop leg or chest pain.  You have a fast pounding heart beat (palpitations).  You have severe headaches.  You develop vision problems.  You feel a lump in your breast.  You have abdominal pain or severe indigestion.   This information is not intended to replace advice given to you by your health care provider. Make sure you discuss any questions you have with your health care provider.   Document Released: 01/05/2004 Document Revised: 06/17/2013 Document Reviewed: 05/14/2013 Elsevier Interactive Patient Education [employer].

## 2015-12-01 NOTE — Progress Notes (Signed)
Subjective:     Patient ID: Judith Rodriguez, female   DOB: [birth year], 50 y.o.   MRN: [id number]  HPI Judith Rodriguez is a 50 year old white female, sp hysterectomy in complaining of being depressed, teary, having hot flashes and anxious at night, and heart feels like it is pounding for a few minutes.She has bad back, has bulging disc, and is in pain a lot.Her son and his 2 kids and girlfriend just moved in with her, and she is trying to work.She was on estrace 1 mg and stopped due to rash on chest and neck. She declines any suicidal or homicidal ideations.  Review of Systems Patient denies any headaches, hearing loss, fatigue, blurred vision, shortness of breath, chest pain, abdominal pain, problems with bowel movements, urination, or intercourse. No joint pain, see HPI for positives. Reviewed past medical,surgical, social and family history. Reviewed medications and allergies.     Objective:   Physical Exam BP 130/82 mmHg  Pulse 88  Ht 5' 3.75" (1.619 m)  Wt 166 lb 8 oz (75.524 kg)  BMI 28.81 kg/m2   Skin warm and dry. Neck: mid line trachea, normal thyroid, good ROM, no lymphadenopathy noted. Lungs: clear to ausculation bilaterally. Cardiovascular: regular rate and rhythm.   PHQ 9 score 15. Discussed trying meds for depression first and if not better with hot flashes can try estrogen gel.Take time for self and follow up on back with neurosurgeon. Face time 15 minutes with 50% counseling.  Assessment:     Depression Hot flashes     Plan:     Rx celexa 20 mg #30 take 1 daily with 2 refills Check CBC,CMP,TSH and lipids,A1c and vitamin D Follow up in 4 weeks Review handout on menopause and depression

## 2015-12-02 LAB — CBC
Hematocrit: 41.2 % (ref 34.0–46.6)
Hemoglobin: 13.6 g/dL (ref 11.1–15.9)
MCH: 32.4 pg (ref 26.6–33.0)
MCHC: 33 g/dL (ref 31.5–35.7)
MCV: 98 fL — ABNORMAL HIGH (ref 79–97)
PLATELETS: 305 10*3/uL (ref 150–379)
RBC: 4.2 x10E6/uL (ref 3.77–5.28)
RDW: 13.1 % (ref 12.3–15.4)
WBC: 9.2 10*3/uL (ref 3.4–10.8)

## 2015-12-02 LAB — COMPREHENSIVE METABOLIC PANEL
ALK PHOS: 102 IU/L (ref 39–117)
ALT: 14 IU/L (ref 0–32)
AST: 17 IU/L (ref 0–40)
Albumin/Globulin Ratio: 1.7 (ref 1.1–2.5)
Albumin: 4.3 g/dL (ref 3.5–5.5)
BUN/Creatinine Ratio: 13 (ref 9–23)
BUN: 12 mg/dL (ref 6–24)
CHLORIDE: 103 mmol/L (ref 96–106)
CO2: 23 mmol/L (ref 18–29)
Calcium: 9.5 mg/dL (ref 8.7–10.2)
Creatinine, Ser: 0.89 mg/dL (ref 0.57–1.00)
GFR calc Af Amer: 87 mL/min/{1.73_m2} (ref >59)
GFR calc non Af Amer: 76 mL/min/{1.73_m2} (ref >59)
GLUCOSE: 96 mg/dL (ref 65–99)
Globulin, Total: 2.5 g/dL (ref 1.5–4.5)
Potassium: 4.7 mmol/L (ref 3.5–5.2)
Sodium: 145 mmol/L — ABNORMAL HIGH (ref 134–144)
TOTAL PROTEIN: 6.8 g/dL (ref 6.0–8.5)

## 2015-12-02 LAB — HEMOGLOBIN A1C
Est. average glucose Bld gHb Est-mCnc: 114 mg/dL
Hgb A1c MFr Bld: 5.6 % (ref 4.8–5.6)

## 2015-12-02 LAB — LIPID PANEL
Chol/HDL Ratio: 5.8 ratio units — ABNORMAL HIGH (ref 0.0–4.4)
Cholesterol, Total: 221 mg/dL — ABNORMAL HIGH (ref 100–199)
HDL: 38 mg/dL — AB (ref >39)
LDL Calculated: 128 mg/dL — ABNORMAL HIGH (ref 0–99)
TRIGLYCERIDES: 273 mg/dL — AB (ref 0–149)
VLDL Cholesterol Cal: 55 mg/dL — ABNORMAL HIGH (ref 5–40)

## 2015-12-02 LAB — TSH: TSH: 2.63 u[IU]/mL (ref 0.450–4.500)

## 2015-12-02 LAB — VITAMIN D 25 HYDROXY (VIT D DEFICIENCY, FRACTURES): Vit D, 25-Hydroxy: 15 ng/mL — ABNORMAL LOW (ref 30.0–100.0)

## 2015-12-05 ENCOUNTER — Encounter: Payer: Self-pay | Admitting: Adult Health

## 2015-12-05 ENCOUNTER — Telehealth: Payer: Self-pay | Admitting: Adult Health

## 2015-12-05 DIAGNOSIS — E559 Vitamin D deficiency, unspecified: Secondary | ICD-10-CM

## 2015-12-05 DIAGNOSIS — E785 Hyperlipidemia, unspecified: Secondary | ICD-10-CM | POA: Insufficient documentation

## 2015-12-05 HISTORY — DX: Vitamin D deficiency, unspecified: E55.9

## 2015-12-05 HISTORY — DX: Hyperlipidemia, unspecified: E78.5

## 2015-12-05 NOTE — Telephone Encounter (Signed)
Pt aware of labs, will modifications and exercise first and take vitamin D3 5000 IU daily

## 2015-12-22 ENCOUNTER — Ambulatory Visit (INDEPENDENT_AMBULATORY_CARE_PROVIDER_SITE_OTHER): Payer: BLUE CROSS/BLUE SHIELD | Admitting: Orthopaedic Surgery

## 2015-12-22 VITALS — BP 135/80 | HR 83 | Temp 97.7°F | Ht 64.5 in | Wt 163.4 lb

## 2015-12-22 DIAGNOSIS — M5442 Lumbago with sciatica, left side: Secondary | ICD-10-CM

## 2015-12-22 MED ORDER — HYDROCODONE-ACETAMINOPHEN 7.5-325 MG PO TABS: 1.0000 | ORAL_TABLET | ORAL | Status: DC | PRN

## 2015-12-22 NOTE — Progress Notes (Signed)
Patient [id number] Judith Rodriguez, female DOB:03/16/1966, 50 y.o. [id number]  Chief Complaint  Patient presents with  . Follow-up    Back pain    HPI  Judith Rodriguez is a 50 y.o. female who has chronic lower back pain.  She has localized pain of the lumbar spine.  She had MRI showing left L3-L4 foraminal disc protrusion.  She is scheduled to have an epidural on March 1st.  She has no bowel or bladder problems.  The pain medicine helps.  She has no new trauma.  HPI  Body mass index is 27.62 kg/(m^2).  Review of Systems  Constitutional:       Patient does not have Diabetes Mellitus. Patient does not have hypertension. Patient does not have COPD or shortness of breath. Patient does not have BMI > 35. Patient has current smoking history.    Past Medical History  Diagnosis Date  . Carpal tunnel syndrome   . Bulging lumbar disc     pushing on sciatic nerve L4  . Depression 12/01/2015  . Hot flashes 12/01/2015  . Vitamin D deficiency 12/05/2015    Take vitamin D3 5000 iu daily  . Dyslipidemia 12/05/2015    Will try diet and exercise first    Past Surgical History  Procedure Laterality Date  . Abdominal hysterectomy    . Cesarean section    . Salpingoophorectomy Left     Family History  Problem Relation Age of Onset  . Cancer Mother     lung  . Cancer Father     lung  . Hypertension Father   . Hypertension Brother   . Aneurysm Paternal Grandmother     brain  . Heart disease Paternal Grandfather     heart attack  . Hypertension Sister     Social History Social History  Substance Use Topics  . Smoking status: Current Every Day Smoker -- 1.00 packs/day for 31 years    Types: Cigarettes  . Smokeless tobacco: Never Used  . Alcohol Use: No    Allergies  Allergen Reactions  . Aspirin Hives    Current Outpatient Prescriptions  Medication Sig Dispense Refill  . Cholecalciferol (VITAMIN D-3 PO) Take by mouth.    . citalopram (CELEXA) 20 MG tablet Take 1 tablet (20 mg total) by  mouth daily. 30 tablet 2  . diphenhydrAMINE (BENADRYL) 25 mg capsule Take 25 mg by mouth daily.     . Menthol, Topical Analgesic, (BENGAY EX) Apply 1 application topically daily as needed (muscle pain).    Marland Kitchen HYDROcodone-acetaminophen (NORCO) 7.5-325 MG tablet Take 1 tablet by mouth every 4 (four) hours as needed for moderate pain (Must last 30 days.  Do not drive or operate machinery while taking this medicine.). 120 tablet 0   No current facility-administered medications for this visit.     Physical Exam  Blood pressure 135/80, pulse 83, temperature 97.7 F (36.5 C), height 5' 4.5" (1.638 m), weight 163 lb 6.4 oz (74.118 kg).  Constitutional: overall normal hygiene, normal nutrition, well developed, normal grooming, normal body habitus. Assistive device:none  Musculoskeletal: gait and station Limp none, muscle tone and strength are normal, no tremors or atrophy is present.  .  Neurological: coordination overall normal.  Deep tendon reflex/nerve stretch intact.  Sensation normal.  Cranial nerves II-XII intact.   Skin:   normal overall no scars, lesions, ulcers or rashes. No psoriasis.  Psychiatric: Alert and oriented x 3.  Recent memory intact, remote memory unclear.  Normal mood and affect. Well groomed.  Good eye contact.  Cardiovascular: overall no swelling, no varicosities, no edema bilaterally, normal temperatures of the legs and arms, no clubbing, cyanosis and good capillary refill.  Lymphatic: palpation is normal.  Spine/Pelvis examination:  Inspection:  Overall, sacoiliac joint benign and hips nontender; without crepitus or defects.   Thoracic spine inspection: Alignment normal without kyphosis present   Lumbar spine inspection:  Alignment  with normal lumbar lordosis, without scoliosis apparent.   Thoracic spine palpation:  without tenderness of spinal processes   Lumbar spine palpation: with tenderness of lumbar area; without tightness of lumbar muscles    Range of  Motion:   Lumbar flexion, forward flexion is 25  without pain or tenderness    Lumbar extension is 5  without pain or tenderness   Left lateral bend is Normal  without pain or tenderness   Right lateral bend is Normal without pain or tenderness   Straight leg raising is Normal   Strength & tone: Normal   Stability overall normal stability      The patient has been educated about the nature of the problem(s) and counseled on treatment options.  The patient appeared to understand what I have discussed and is in agreement with it.  PLAN Call if any problems.  Precautions discussed.  Continue current medications.   Return to clinic 5 weeks.

## 2015-12-22 NOTE — Patient Instructions (Signed)
Keep epidural appointment  Call if any problems.

## 2015-12-29 ENCOUNTER — Encounter: Payer: Self-pay | Admitting: Adult Health

## 2015-12-29 ENCOUNTER — Ambulatory Visit (INDEPENDENT_AMBULATORY_CARE_PROVIDER_SITE_OTHER): Payer: BLUE CROSS/BLUE SHIELD | Admitting: Adult Health

## 2015-12-29 VITALS — BP 120/78 | HR 86 | Ht 64.0 in | Wt 164.5 lb

## 2015-12-29 DIAGNOSIS — R232 Flushing: Secondary | ICD-10-CM

## 2015-12-29 DIAGNOSIS — E785 Hyperlipidemia, unspecified: Secondary | ICD-10-CM | POA: Diagnosis not present

## 2015-12-29 DIAGNOSIS — F329 Major depressive disorder, single episode, unspecified: Secondary | ICD-10-CM

## 2015-12-29 DIAGNOSIS — N951 Menopausal and female climacteric states: Secondary | ICD-10-CM | POA: Diagnosis not present

## 2015-12-29 DIAGNOSIS — E559 Vitamin D deficiency, unspecified: Secondary | ICD-10-CM

## 2015-12-29 DIAGNOSIS — F32A Depression, unspecified: Secondary | ICD-10-CM

## 2015-12-29 MED ORDER — CITALOPRAM HYDROBROMIDE 20 MG PO TABS: 20.0000 mg | ORAL_TABLET | Freq: Every day | ORAL | Status: DC

## 2015-12-29 NOTE — Progress Notes (Signed)
Subjective:     Patient ID: Judith Rodriguez, female   DOB: 12-20-1965, 50 y.o.   MRN: [id number]  HPI Judith Rodriguez is a 50 year old white female, back in follow up of starting Celexa for depression and hot flashes, 12/01/15.She is feeling so much better, says its a miracle.Has only 2 hot flashes at night.Had labs in February and has elevated cholesterol and trig. And vitamin D def.  Review of Systems Patient denies any headaches, hearing loss, fatigue, blurred vision, shortness of breath, chest pain, abdominal pain, problems with bowel movements, urination, or intercourse. No joint pain or mood swings.See HPI for positives.  Reviewed past medical,surgical, social and family history. Reviewed medications and allergies.     Objective:   Physical Exam BP 120/78 mmHg  Pulse 86  Ht 5\' 4"  (1.626 m)  Wt 164 lb 8 oz (74.617 kg)  BMI 28.22 kg/m2 Skin warm and dry. Neck: mid line trachea, normal thyroid, good ROM, no lymphadenopathy noted. Lungs: clear to ausculation bilaterally. Cardiovascular: regular rate and rhythm. PHQ 9 score is 1, was 15 12/01/15.She says she feels much better.She working on her diet and also taking vitamin D 3.Will Celexa at current dose. Will check labs in 3 months. Face time 15 minutes with 50% counseling and talking.    Assessment:     Depression Hot flashes Dyslipidemia  Vitamin D deficiency     Plan:     Refilled Celexa 20 mg #30 take 1 daily with 6 refills Get fasting labs in June, CMP,Vitamin D and Lipids Follow up in 6 months for ROS or before if needed.

## 2016-01-18 ENCOUNTER — Telehealth: Payer: Self-pay | Admitting: Orthopaedic Surgery

## 2016-01-18 NOTE — Telephone Encounter (Signed)
Patient requesting refill of Hydrocodone 7.5/325mg   Qty 120 Tablets

## 2016-01-19 MED ORDER — HYDROCODONE-ACETAMINOPHEN 7.5-325 MG PO TABS: 1.0000 | ORAL_TABLET | ORAL | Status: DC | PRN

## 2016-01-19 NOTE — Telephone Encounter (Signed)
Rx done. 

## 2016-01-26 ENCOUNTER — Encounter: Payer: Self-pay | Admitting: Orthopaedic Surgery

## 2016-01-26 ENCOUNTER — Ambulatory Visit (INDEPENDENT_AMBULATORY_CARE_PROVIDER_SITE_OTHER): Payer: BLUE CROSS/BLUE SHIELD | Admitting: Orthopaedic Surgery

## 2016-01-26 VITALS — BP 135/79 | HR 87 | Temp 97.2°F | Ht 64.0 in | Wt 164.5 lb

## 2016-01-26 DIAGNOSIS — M5442 Lumbago with sciatica, left side: Secondary | ICD-10-CM

## 2016-01-26 NOTE — Progress Notes (Signed)
Patient [id number] Judith Rodriguez, female DOB:1965/12/16, 50 y.o. [id number]  Chief Complaint  Patient presents with  . Follow-up    HPI  Judith Rodriguez is a 50 y.o. female who is seen for follow-up of lower back pain.  She has no paresthesias, no trauma, no bowel or bladder problem.  She is better.  She is doing her exercises.   Back Pain This is a chronic problem. The current episode started more than 1 year ago. The problem occurs daily. The problem has been gradually improving since onset. The pain is present in the lumbar spine. The pain does not radiate. The pain is at a severity of 2/10. The pain is mild. The symptoms are aggravated by bending and twisting. Pertinent negatives include no chest pain. She has tried analgesics, home exercises and NSAIDs for the symptoms. The treatment provided moderate relief.    Body mass index is 28.22 kg/(m^2).   Review of Systems  Constitutional:       Patient does not have Diabetes Mellitus. Patient does not have hypertension. Patient does not have COPD or shortness of breath. Patient does not have BMI > 35. Patient has current smoking history.  HENT: Negative for congestion.   Respiratory: Negative for cough and shortness of breath.   Cardiovascular: Negative for chest pain and leg swelling.  Endocrine: Negative for cold intolerance.  Musculoskeletal: Positive for back pain and arthralgias.  Allergic/Immunologic: Positive for environmental allergies.    Past Medical History  Diagnosis Date  . Carpal tunnel syndrome   . Bulging lumbar disc     pushing on sciatic nerve L4  . Depression 12/01/2015  . Hot flashes 12/01/2015  . Vitamin D deficiency 12/05/2015    Take vitamin D3 5000 iu daily  . Dyslipidemia 12/05/2015    Will try diet and exercise first    Past Surgical History  Procedure Laterality Date  . Abdominal hysterectomy    . Cesarean section    . Salpingoophorectomy Left     Family History  Problem Relation Age of Onset  . Cancer Mother      lung  . Cancer Father     lung  . Hypertension Father   . Hypertension Brother   . Aneurysm Paternal Grandmother     brain  . Heart disease Paternal Grandfather     heart attack  . Hypertension Sister     Social History Social History  Substance Use Topics  . Smoking status: Current Every Day Smoker -- 1.00 packs/day for 31 years    Types: Cigarettes  . Smokeless tobacco: Never Used  . Alcohol Use: No    Allergies  Allergen Reactions  . Aspirin Hives    Current Outpatient Prescriptions  Medication Sig Dispense Refill  . Cholecalciferol (VITAMIN D-3 PO) Take by mouth daily.     . citalopram (CELEXA) 20 MG tablet Take 1 tablet (20 mg total) by mouth daily. 30 tablet 6  . diphenhydrAMINE (BENADRYL) 25 mg capsule Take 25 mg by mouth daily.     Marland Kitchen HYDROcodone-acetaminophen (NORCO) 7.5-325 MG tablet Take 1 tablet by mouth every 4 (four) hours as needed for moderate pain (Must last 30 days.  Do not drive or operate machinery while taking this medicine.). 120 tablet 0  . Menthol, Topical Analgesic, (BENGAY EX) Apply 1 application topically daily as needed (muscle pain).     No current facility-administered medications for this visit.     Physical Exam  Blood pressure 135/79, pulse 87, temperature 97.2 F (36.2 C), height  5\' 4"  (1.626 m), weight 164 lb 8 oz (74.617 kg).  Constitutional: overall normal hygiene, normal nutrition, well developed, normal grooming, normal body habitus. Assistive device:none  Musculoskeletal: gait and station Limp none, muscle tone and strength are normal, no tremors or atrophy is present.  .  Neurological: coordination overall normal.  Deep tendon reflex/nerve stretch intact.  Sensation normal.  Cranial nerves II-XII intact.   Skin:   normal overall no scars, lesions, ulcers or rashes. No psoriasis.  Psychiatric: Alert and oriented x 3.  Recent memory intact, remote memory unclear.  Normal mood and affect. Well groomed.  Good eye  contact.  Cardiovascular: overall no swelling, no varicosities, no edema bilaterally, normal temperatures of the legs and arms, no clubbing, cyanosis and good capillary refill.  Lymphatic: palpation is normal.  Spine/Pelvis examination:  Inspection:  Overall, sacoiliac joint benign and hips nontender; without crepitus or defects.   Thoracic spine inspection: Alignment normal without kyphosis present   Lumbar spine inspection:  Alignment  with normal lumbar lordosis, without scoliosis apparent.   Thoracic spine palpation:  without tenderness of spinal processes   Lumbar spine palpation: with tenderness of lumbar area; without tightness of lumbar muscles    Range of Motion:   Lumbar flexion, forward flexion is 45  without pain or tenderness    Lumbar extension is full  without pain or tenderness   Left lateral bend is Normal  without pain or tenderness   Right lateral bend is Normal without pain or tenderness   Straight leg raising is Normal   Strength & tone: Normal   Stability overall normal stability  The patient has been educated about the nature of the problem(s) and counseled on treatment options.  The patient appeared to understand what I have discussed and is in agreement with it.  Encounter Diagnosis  Name Primary?  . Left-sided low back pain with left-sided sciatica Yes    PLAN Call if any problems.  Precautions discussed.  Continue current medications.   Return to clinic 2 months

## 2016-02-17 ENCOUNTER — Telehealth: Payer: Self-pay | Admitting: Orthopaedic Surgery

## 2016-02-17 NOTE — Telephone Encounter (Signed)
Hydrocodone-Acetaminophen 7.5/325mg Qty 120 Tablets °

## 2016-02-20 MED ORDER — HYDROCODONE-ACETAMINOPHEN 7.5-325 MG PO TABS: 1.0000 | ORAL_TABLET | ORAL | Status: DC | PRN

## 2016-02-20 NOTE — Telephone Encounter (Signed)
Rx Done . 

## 2016-03-21 ENCOUNTER — Telehealth: Payer: Self-pay | Admitting: Orthopaedic Surgery

## 2016-03-21 MED ORDER — HYDROCODONE-ACETAMINOPHEN 7.5-325 MG PO TABS: 1.0000 | ORAL_TABLET | ORAL | Status: DC | PRN

## 2016-03-21 NOTE — Telephone Encounter (Signed)
Rx Done . 

## 2016-03-21 NOTE — Telephone Encounter (Signed)
Hydrocodone-Acetaminophen 7.5/325mg Qty 120 Tablets °

## 2016-03-27 ENCOUNTER — Encounter: Payer: Self-pay | Admitting: Orthopaedic Surgery

## 2016-03-27 ENCOUNTER — Ambulatory Visit (INDEPENDENT_AMBULATORY_CARE_PROVIDER_SITE_OTHER): Payer: BLUE CROSS/BLUE SHIELD | Admitting: Orthopaedic Surgery

## 2016-03-27 VITALS — BP 118/73 | HR 86 | Temp 97.7°F | Ht 64.0 in | Wt 168.0 lb

## 2016-03-27 DIAGNOSIS — M5442 Lumbago with sciatica, left side: Secondary | ICD-10-CM | POA: Diagnosis not present

## 2016-03-27 NOTE — Progress Notes (Signed)
Patient [id number] [name], female DOB:03-27-1966, 50 y.o. [id number]  Chief Complaint  Patient presents with  . Follow-up    back pain    HPI  Judith Rodriguez is a 50 y.o. female who has chronic lower back pain.  She has been stable with her pain.  She has left sided sciatica that is acting up some.  She is going to call for new epidurals in [place] this week.  She has no trauma.  She has no weakness.  She has no bowel or bladder problem.  HPI  Body mass index is 28.82 kg/(m^2).  ROS  Review of Systems  Constitutional:       Patient does not have Diabetes Mellitus. Patient does not have hypertension. Patient does not have COPD or shortness of breath. Patient does not have BMI > 35. Patient has current smoking history.  HENT: Negative for congestion.   Respiratory: Negative for cough and shortness of breath.   Cardiovascular: Negative for chest pain and leg swelling.  Endocrine: Negative for cold intolerance.  Musculoskeletal: Positive for back pain and arthralgias.  Allergic/Immunologic: Positive for environmental allergies.    Past Medical History  Diagnosis Date  . Carpal tunnel syndrome   . Bulging lumbar disc     pushing on sciatic nerve L4  . Depression 12/01/2015  . Hot flashes 12/01/2015  . Vitamin D deficiency 12/05/2015    Take vitamin D3 5000 iu daily  . Dyslipidemia 12/05/2015    Will try diet and exercise first    Past Surgical History  Procedure Laterality Date  . Abdominal hysterectomy    . Cesarean section    . Salpingoophorectomy Left     Family History  Problem Relation Age of Onset  . Cancer Mother     lung  . Cancer Father     lung  . Hypertension Father   . Hypertension Brother   . Aneurysm Paternal Grandmother     brain  . Heart disease Paternal Grandfather     heart attack  . Hypertension Sister     Social History Social History  Substance Use Topics  . Smoking status: Current Every Day Smoker -- 1.00 packs/day for 31 years    Types:  Cigarettes  . Smokeless tobacco: Never Used  . Alcohol Use: No    Allergies  Allergen Reactions  . Aspirin Hives    Current Outpatient Prescriptions  Medication Sig Dispense Refill  . Cholecalciferol (VITAMIN D-3 PO) Take by mouth daily.     . citalopram (CELEXA) 20 MG tablet Take 1 tablet (20 mg total) by mouth daily. 30 tablet 6  . diphenhydrAMINE (BENADRYL) 25 mg capsule Take 25 mg by mouth daily.     Marland Kitchen HYDROcodone-acetaminophen (NORCO) 7.5-325 MG tablet Take 1 tablet by mouth every 4 (four) hours as needed for moderate pain (Must last 30 days.  Do not drive or operate machinery while taking this medicine.). 120 tablet 0  . Menthol, Topical Analgesic, (BENGAY EX) Apply 1 application topically daily as needed (muscle pain).     No current facility-administered medications for this visit.     Physical Exam  Blood pressure 118/73, pulse 86, temperature 97.7 F (36.5 C), height 5\' 4"  (1.626 m), weight 168 lb (76.204 kg).  Constitutional: overall normal hygiene, normal nutrition, well developed, normal grooming, normal body habitus. Assistive device:none  Musculoskeletal: gait and station Limp none, muscle tone and strength are normal, no tremors or atrophy is present.  .  Neurological: coordination overall normal.  Deep tendon  reflex/nerve stretch intact.  Sensation normal.  Cranial nerves II-XII intact.   Skin:   normal overall no scars, lesions, ulcers or rashes. No psoriasis.  Psychiatric: Alert and oriented x 3.  Recent memory intact, remote memory unclear.  Normal mood and affect. Well groomed.  Good eye contact.  Cardiovascular: overall no swelling, no varicosities, no edema bilaterally, normal temperatures of the legs and arms, no clubbing, cyanosis and good capillary refill.  Lymphatic: palpation is normal. Spine/Pelvis examination:  Inspection:  Overall, sacoiliac joint benign and hips nontender; without crepitus or defects.   Thoracic spine inspection: Alignment  normal without kyphosis present   Lumbar spine inspection:  Alignment  with normal lumbar lordosis, without scoliosis apparent.   Thoracic spine palpation:  without tenderness of spinal processes   Lumbar spine palpation: with tenderness of lumbar area; without tightness of lumbar muscles    Range of Motion:   Lumbar flexion, forward flexion is full  without pain or tenderness    Lumbar extension is 10  without pain or tenderness   Left lateral bend is Normal  without pain or tenderness   Right lateral bend is Normal without pain or tenderness   Straight leg raising is Normal   Strength & tone: Normal   Stability overall normal stability      The patient has been educated about the nature of the problem(s) and counseled on treatment options.  The patient appeared to understand what I have discussed and is in agreement with it.  Encounter Diagnosis  Name Primary?  . Left-sided low back pain with left-sided sciatica Yes    PLAN Call if any problems.  Precautions discussed.  Continue current medications.   Return to clinic 2 months   Call to set up epidurals.

## 2016-04-18 ENCOUNTER — Telehealth: Payer: Self-pay | Admitting: Orthopaedic Surgery

## 2016-04-18 MED ORDER — HYDROCODONE-ACETAMINOPHEN 7.5-325 MG PO TABS: 1.0000 | ORAL_TABLET | ORAL | Status: DC | PRN

## 2016-04-18 NOTE — Telephone Encounter (Signed)
Rx done. 

## 2016-04-18 NOTE — Telephone Encounter (Signed)
Call received from patient for refill of medication: HYDROcodone-acetaminophen (NORCO) 7.5-325 MG tablet [id number] - quantity of 120.

## 2016-04-24 LAB — COMPREHENSIVE METABOLIC PANEL
A/G RATIO: 1.7 (ref 1.2–2.2)
ALK PHOS: 112 IU/L (ref 39–117)
ALT: 13 IU/L (ref 0–32)
AST: 18 IU/L (ref 0–40)
Albumin: 4.4 g/dL (ref 3.5–5.5)
BUN/Creatinine Ratio: 14 (ref 9–23)
BUN: 11 mg/dL (ref 6–24)
Bilirubin Total: 0.3 mg/dL (ref 0.0–1.2)
CHLORIDE: 102 mmol/L (ref 96–106)
CO2: 24 mmol/L (ref 18–29)
Calcium: 9.4 mg/dL (ref 8.7–10.2)
Creatinine, Ser: 0.81 mg/dL (ref 0.57–1.00)
GFR calc Af Amer: 98 mL/min/{1.73_m2} (ref >59)
GFR calc non Af Amer: 85 mL/min/{1.73_m2} (ref >59)
GLOBULIN, TOTAL: 2.6 g/dL (ref 1.5–4.5)
Glucose: 94 mg/dL (ref 65–99)
POTASSIUM: 4.8 mmol/L (ref 3.5–5.2)
SODIUM: 142 mmol/L (ref 134–144)
Total Protein: 7 g/dL (ref 6.0–8.5)

## 2016-04-24 LAB — VITAMIN D 25 HYDROXY (VIT D DEFICIENCY, FRACTURES): VIT D 25 HYDROXY: 60.4 ng/mL (ref 30.0–100.0)

## 2016-04-24 LAB — LIPID PANEL
CHOLESTEROL TOTAL: 221 mg/dL — AB (ref 100–199)
Chol/HDL Ratio: 5.8 ratio units — ABNORMAL HIGH (ref 0.0–4.4)
HDL: 38 mg/dL — AB (ref >39)
LDL CALC: 137 mg/dL — AB (ref 0–99)
Triglycerides: 232 mg/dL — ABNORMAL HIGH (ref 0–149)
VLDL CHOLESTEROL CAL: 46 mg/dL — AB (ref 5–40)

## 2016-04-26 ENCOUNTER — Telehealth: Payer: Self-pay | Admitting: Adult Health

## 2016-04-26 NOTE — Telephone Encounter (Signed)
Pt aware of labs and need to increase activity and decrease carbs and take fish oil will recheck labs in 3 months, put in recall

## 2016-05-16 ENCOUNTER — Telehealth: Payer: Self-pay | Admitting: *Deleted

## 2016-05-16 MED ORDER — HYDROCODONE-ACETAMINOPHEN 7.5-325 MG PO TABS: 1.0000 | ORAL_TABLET | ORAL | Status: DC | PRN

## 2016-05-16 NOTE — Telephone Encounter (Signed)
Rx Done . 

## 2016-05-16 NOTE — Telephone Encounter (Signed)
Call received from patient requesting refill on  HYDROcodone-acetaminophen (NORCO) 7.5-325 MG tablet [facility]  #120

## 2016-05-18 ENCOUNTER — Telehealth: Payer: Self-pay | Admitting: Obstetrics & Gynecology

## 2016-05-18 ENCOUNTER — Telehealth: Payer: Self-pay | Admitting: Adult Health

## 2016-05-18 MED ORDER — METRONIDAZOLE 500 MG PO TABS: 500.0000 mg | ORAL_TABLET | Freq: Two times a day (BID) | ORAL | Status: DC

## 2016-05-18 NOTE — Telephone Encounter (Signed)
Spoke with pt. Pt got in a pool recently and now has vaginal irritation and odor. No discharge. When this happened before, JAG ordered Flagyl. Can you order Flagyl? Thanks!! [place]

## 2016-05-18 NOTE — Telephone Encounter (Signed)
Left message x 1. JSY 

## 2016-06-13 ENCOUNTER — Telehealth: Payer: Self-pay | Admitting: Orthopaedic Surgery

## 2016-06-13 MED ORDER — HYDROCODONE-ACETAMINOPHEN 7.5-325 MG PO TABS: 1.0000 | ORAL_TABLET | ORAL | 0 refills | Status: DC | PRN

## 2016-06-13 NOTE — Telephone Encounter (Signed)
Patient called and requested a refill on Hydrocodone/Acetaminophen 7.5-325 mgs.  Qty  120  Sig: Take 1 tablet by mouth every 4 (four) hours as needed for moderate pain (Must last 30 days. Do not drive or operate machinery while taking this medicine.).

## 2016-06-28 ENCOUNTER — Encounter: Payer: Self-pay | Admitting: Orthopaedic Surgery

## 2016-06-28 ENCOUNTER — Ambulatory Visit (INDEPENDENT_AMBULATORY_CARE_PROVIDER_SITE_OTHER): Payer: BLUE CROSS/BLUE SHIELD | Admitting: Orthopaedic Surgery

## 2016-06-28 VITALS — BP 130/85 | HR 93 | Temp 97.3°F | Ht 65.0 in | Wt 167.0 lb

## 2016-06-28 DIAGNOSIS — M5442 Lumbago with sciatica, left side: Secondary | ICD-10-CM | POA: Diagnosis not present

## 2016-06-28 DIAGNOSIS — F17200 Nicotine dependence, unspecified, uncomplicated: Secondary | ICD-10-CM

## 2016-06-28 DIAGNOSIS — Z720 Tobacco use: Secondary | ICD-10-CM

## 2016-06-28 NOTE — Patient Instructions (Signed)
Smoking Cessation, Tips for Success If you are ready to quit smoking, congratulations! You have chosen to help yourself be healthier. Cigarettes bring nicotine, tar, carbon monoxide, and other irritants into your body. Your lungs, heart, and blood vessels will be able to work better without these poisons. There are many different ways to quit smoking. Nicotine gum, nicotine patches, a nicotine inhaler, or nicotine nasal spray can help with physical craving. Hypnosis, support groups, and medicines help break the habit of smoking. WHAT THINGS CAN I DO TO MAKE QUITTING EASIER?  Here are some tips to help you quit for good:  Pick a date when you will quit smoking completely. Tell all of your friends and family about your plan to quit on that date.  Do not try to slowly cut down on the number of cigarettes you are smoking. Pick a quit date and quit smoking completely starting on that day.  Throw away all cigarettes.   Clean and remove all ashtrays from your home, work, and car.  On a card, write down your reasons for quitting. Carry the card with you and read it when you get the urge to smoke.  Cleanse your body of nicotine. Drink enough water and fluids to keep your urine clear or pale yellow. Do this after quitting to flush the nicotine from your body.  Learn to predict your moods. Do not let a bad situation be your excuse to have a cigarette. Some situations in your life might tempt you into wanting a cigarette.  Never have "just one" cigarette. It leads to wanting another and another. Remind yourself of your decision to quit.  Change habits associated with smoking. If you smoked while driving or when feeling stressed, try other activities to replace smoking. Stand up when drinking your coffee. Brush your teeth after eating. Sit in a different chair when you read the paper. Avoid alcohol while trying to quit, and try to drink fewer caffeinated beverages. Alcohol and caffeine may urge you to  smoke.  Avoid foods and drinks that can trigger a desire to smoke, such as sugary or spicy foods and alcohol.  Ask people who smoke not to smoke around you.  Have something planned to do right after eating or having a cup of coffee. For example, plan to take a walk or exercise.  Try a relaxation exercise to calm you down and decrease your stress. Remember, you may be tense and nervous for the first 2 weeks after you quit, but this will pass.  Find new activities to keep your hands busy. Play with a pen, coin, or rubber band. Doodle or draw things on paper.  Brush your teeth right after eating. This will help cut down on the craving for the taste of tobacco after meals. You can also try mouthwash.   Use oral substitutes in place of cigarettes. Try using lemon drops, carrots, cinnamon sticks, or chewing gum. Keep them handy so they are available when you have the urge to smoke.  When you have the urge to smoke, try deep breathing.  Designate your home as a nonsmoking area.  If you are a heavy smoker, ask your health care provider about a prescription for nicotine chewing gum. It can ease your withdrawal from nicotine.  Reward yourself. Set aside the cigarette money you save and buy yourself something nice.  Look for support from others. Join a support group or smoking cessation program. Ask someone at home or at work to help you with your plan   to quit smoking.  Always ask yourself, "Do I need this cigarette or is this just a reflex?" Tell yourself, "Today, I choose not to smoke," or "I do not want to smoke." You are reminding yourself of your decision to quit.  Do not replace cigarette smoking with electronic cigarettes (commonly called e-cigarettes). The safety of e-cigarettes is unknown, and some may contain harmful chemicals.  If you relapse, do not give up! Plan ahead and think about what you will do the next time you get the urge to smoke. HOW WILL I FEEL WHEN I QUIT SMOKING? You  may have symptoms of withdrawal because your body is used to nicotine (the addictive substance in cigarettes). You may crave cigarettes, be irritable, feel very hungry, cough often, get headaches, or have difficulty concentrating. The withdrawal symptoms are only temporary. They are strongest when you first quit but will go away within 10-14 days. When withdrawal symptoms occur, stay in control. Think about your reasons for quitting. Remind yourself that these are signs that your body is healing and getting used to being without cigarettes. Remember that withdrawal symptoms are easier to treat than the major diseases that smoking can cause.  Even after the withdrawal is over, expect periodic urges to smoke. However, these cravings are generally short lived and will go away whether you smoke or not. Do not smoke! WHAT RESOURCES ARE AVAILABLE TO HELP ME QUIT SMOKING? Your health care provider can direct you to community resources or hospitals for support, which may include:  Group support.  Education.  Hypnosis.  Therapy.   This information is not intended to replace advice given to you by your health care provider. Make sure you discuss any questions you have with your health care provider.   Document Released: 07/13/2004 Document Revised: 11/05/2014 Document Reviewed: 04/02/2013 Elsevier Interactive Patient Education 2016 [employer] Inc[employer]  

## 2016-06-28 NOTE — Progress Notes (Signed)
Patient [id number] G[place], female DOB:October 25, 1966, 50 y.o. [id number]  Chief Complaint  Patient presents with  . Follow-up    BACK PAIN    HPI  Judith Rodriguez is a 50 y.o. female who has chronic lower back pain. She got the epidurals done recently and is much better.  Her left sided sciatica is better.  She is doing her exercises.  She has no new trauma. She has no weakness. HPI  Body mass index is 27.79 kg/m.  ROS  Review of Systems  Constitutional:       Patient does not have Diabetes Mellitus. Patient does not have hypertension. Patient does not have COPD or shortness of breath. Patient does not have BMI > 35. Patient has current smoking history.  HENT: Negative for congestion.   Respiratory: Negative for cough and shortness of breath.   Cardiovascular: Negative for chest pain and leg swelling.  Endocrine: Negative for cold intolerance.  Musculoskeletal: Positive for arthralgias and back pain.  Allergic/Immunologic: Positive for environmental allergies.    Past Medical History:  Diagnosis Date  . Bulging lumbar disc    pushing on sciatic nerve L4  . Carpal tunnel syndrome   . Depression 12/01/2015  . Dyslipidemia 12/05/2015   Will try diet and exercise first  . Hot flashes 12/01/2015  . Vitamin D deficiency 12/05/2015   Take vitamin D3 5000 iu daily    Past Surgical History:  Procedure Laterality Date  . ABDOMINAL HYSTERECTOMY    . CESAREAN SECTION    . SALPINGOOPHORECTOMY Left     Family History  Problem Relation Age of Onset  . Cancer Mother     lung  . Cancer Father     lung  . Hypertension Father   . Hypertension Brother   . Aneurysm Paternal Grandmother     brain  . Heart disease Paternal Grandfather     heart attack  . Hypertension Sister     Social History Social History  Substance Use Topics  . Smoking status: Current Every Day Smoker    Packs/day: 1.00    Years: 31.00    Types: Cigarettes  . Smokeless tobacco: Never Used  . Alcohol use No     Allergies  Allergen Reactions  . Aspirin Hives    Current Outpatient Prescriptions  Medication Sig Dispense Refill  . Cholecalciferol (VITAMIN D-3 PO) Take by mouth daily.     . citalopram (CELEXA) 20 MG tablet Take 1 tablet (20 mg total) by mouth daily. 30 tablet 6  . diphenhydrAMINE (BENADRYL) 25 mg capsule Take 25 mg by mouth daily.     Marland Kitchen HYDROcodone-acetaminophen (NORCO) 7.5-325 MG tablet Take 1 tablet by mouth every 4 (four) hours as needed for moderate pain (Must last 30 days.  Do not drive or operate machinery while taking this medicine.). 120 tablet 0  . Menthol, Topical Analgesic, (BENGAY EX) Apply 1 application topically daily as needed (muscle pain).    . metroNIDAZOLE (FLAGYL) 500 MG tablet Take 1 tablet (500 mg total) by mouth 2 (two) times daily. 14 tablet 0   No current facility-administered medications for this visit.      Physical Exam  Blood pressure 130/85, pulse 93, temperature 97.3 F (36.3 C), height 5\' 5"  (1.651 m), weight 167 lb (75.8 kg).  Constitutional: overall normal hygiene, normal nutrition, well developed, normal grooming, normal body habitus. Assistive device:none  Musculoskeletal: gait and station Limp none, muscle tone and strength are normal, no tremors or atrophy is present.  .  Neurological:  coordination overall normal.  Deep tendon reflex/nerve stretch intact.  Sensation normal.  Cranial nerves II-XII intact.   Skin:   normal overall no scars, lesions, ulcers or rashes. No psoriasis.  Psychiatric: Alert and oriented x 3.  Recent memory intact, remote memory unclear.  Normal mood and affect. Well groomed.  Good eye contact.  Cardiovascular: overall no swelling, no varicosities, no edema bilaterally, normal temperatures of the legs and arms, no clubbing, cyanosis and good capillary refill.  Lymphatic: palpation is normal.  Spine/Pelvis examination:  Inspection:  Overall, sacoiliac joint benign and hips nontender; without crepitus or  defects.   Thoracic spine inspection: Alignment normal without kyphosis present   Lumbar spine inspection:  Alignment  with normal lumbar lordosis, without scoliosis apparent.   Thoracic spine palpation:  without tenderness of spinal processes   Lumbar spine palpation: with tenderness of lumbar area; without tightness of lumbar muscles    Range of Motion:   Lumbar flexion, forward flexion is 45 without pain or tenderness    Lumbar extension is full without pain or tenderness   Left lateral bend is Normal  without pain or tenderness   Right lateral bend is Normal without pain or tenderness   Straight leg raising is Normal   Strength & tone: Normal   Stability overall normal stability    I have talked to her about smoking and quitting. She is unwilling to consider this at all.  She says she enjoys smoking and knows of the side effects but will not stop at this time.  I appreciate her honesty but again told her of health problems.  The patient has been educated about the nature of the problem(s) and counseled on treatment options.  The patient appeared to understand what I have discussed and is in agreement with it.  Encounter Diagnoses  Name Primary?  . Left-sided low back pain with left-sided sciatica Yes  . Tobacco smoker within last 12 months     PLAN Call if any problems.  Precautions discussed.  Continue current medications.   Return to clinic 3 months   Electronically Signed Sanjuana Kava, MD 8/31/20179:52 AM

## 2016-07-16 ENCOUNTER — Telehealth: Payer: Self-pay | Admitting: Orthopaedic Surgery

## 2016-07-16 MED ORDER — HYDROCODONE-ACETAMINOPHEN 7.5-325 MG PO TABS: 1.0000 | ORAL_TABLET | Freq: Four times a day (QID) | ORAL | 0 refills | Status: DC | PRN

## 2016-07-16 NOTE — Telephone Encounter (Signed)
Hydrocodone-Acetaminophen 7.5/325mg Qty 120 Tablets °

## 2016-08-14 ENCOUNTER — Telehealth: Payer: Self-pay | Admitting: Orthopaedic Surgery

## 2016-08-14 NOTE — Telephone Encounter (Signed)
Patient requests refill:  HYDROcodone-acetaminophen (NORCO) 7.5-325 MG tablet 110 tablet   - insurance: [employer]

## 2016-08-15 MED ORDER — HYDROCODONE-ACETAMINOPHEN 7.5-325 MG PO TABS: 1.0000 | ORAL_TABLET | Freq: Four times a day (QID) | ORAL | 0 refills | Status: DC | PRN

## 2016-08-28 ENCOUNTER — Ambulatory Visit (INDEPENDENT_AMBULATORY_CARE_PROVIDER_SITE_OTHER): Payer: BLUE CROSS/BLUE SHIELD | Admitting: Orthopaedic Surgery

## 2016-08-28 ENCOUNTER — Encounter: Payer: Self-pay | Admitting: Orthopaedic Surgery

## 2016-08-28 ENCOUNTER — Ambulatory Visit (INDEPENDENT_AMBULATORY_CARE_PROVIDER_SITE_OTHER): Payer: BLUE CROSS/BLUE SHIELD

## 2016-08-28 VITALS — BP 133/85 | HR 89 | Temp 97.5°F | Ht 65.0 in | Wt 167.0 lb

## 2016-08-28 DIAGNOSIS — M25572 Pain in left ankle and joints of left foot: Secondary | ICD-10-CM

## 2016-08-28 DIAGNOSIS — S90212A Contusion of left great toe with damage to nail, initial encounter: Secondary | ICD-10-CM | POA: Diagnosis not present

## 2016-08-28 NOTE — Progress Notes (Signed)
Patient [id number] Judith Rodriguez, female DOB:Feb 27, 1966, 50 y.o. [id number]  Chief Complaint  Patient presents with  . Foot Pain    left foot and great toe pain.  [place] yesterday 08/27/16    HPI  Judith Rodriguez is a 50 y.o. female who fell yesterday and hurt her left great toe and left foot.  She developed ecchymosis and swelling of the great toe. It hurt a lot.  She used ice and stayed off it.  It still hurts today.  She has no other injury.   HPI  Body mass index is 27.79 kg/m.  ROS  Review of Systems  Constitutional:       Patient does not have Diabetes Mellitus. Patient does not have hypertension. Patient does not have COPD or shortness of breath. Patient does not have BMI > 35. Patient has current smoking history.  HENT: Negative for congestion.   Respiratory: Negative for cough and shortness of breath.   Cardiovascular: Negative for chest pain and leg swelling.  Endocrine: Negative for cold intolerance.  Musculoskeletal: Positive for arthralgias and back pain.  Allergic/Immunologic: Positive for environmental allergies.    Past Medical History:  Diagnosis Date  . Bulging lumbar disc    pushing on sciatic nerve L4  . Carpal tunnel syndrome   . Depression 12/01/2015  . Dyslipidemia 12/05/2015   Will try diet and exercise first  . Hot flashes 12/01/2015  . Vitamin D deficiency 12/05/2015   Take vitamin D3 5000 iu daily    Past Surgical History:  Procedure Laterality Date  . ABDOMINAL HYSTERECTOMY    . CESAREAN SECTION    . SALPINGOOPHORECTOMY Left     Family History  Problem Relation Age of Onset  . Cancer Mother     lung  . Cancer Father     lung  . Hypertension Father   . Hypertension Brother   . Aneurysm Paternal Grandmother     brain  . Heart disease Paternal Grandfather     heart attack  . Hypertension Sister     Social History Social History  Substance Use Topics  . Smoking status: Current Every Day Smoker    Packs/day: 1.00    Years: 31.00    Types:  Cigarettes  . Smokeless tobacco: Never Used  . Alcohol use No    Allergies  Allergen Reactions  . Aspirin Hives    Current Outpatient Prescriptions  Medication Sig Dispense Refill  . Cholecalciferol (VITAMIN D-3 PO) Take by mouth daily.     . citalopram (CELEXA) 20 MG tablet Take 1 tablet (20 mg total) by mouth daily. 30 tablet 6  . diphenhydrAMINE (BENADRYL) 25 mg capsule Take 25 mg by mouth daily.     Marland Kitchen HYDROcodone-acetaminophen (NORCO) 7.5-325 MG tablet Take 1 tablet by mouth every 6 (six) hours as needed for moderate pain (Must last 30 days.Do not drive or operate machinery while taking this medicine.). 100 tablet 0  . Menthol, Topical Analgesic, (BENGAY EX) Apply 1 application topically daily as needed (muscle pain).    . metroNIDAZOLE (FLAGYL) 500 MG tablet Take 1 tablet (500 mg total) by mouth 2 (two) times daily. 14 tablet 0   No current facility-administered medications for this visit.      Physical Exam  Blood pressure 133/85, pulse 89, temperature 97.5 F (36.4 C), height 5\' 5"  (1.651 m), weight 167 lb (75.8 kg).  Constitutional: overall normal hygiene, normal nutrition, well developed, normal grooming, normal body habitus. Assistive device:none  Musculoskeletal: gait and station Limp left, muscle  tone and strength are normal, no tremors or atrophy is present.  .  Neurological: coordination overall normal.  Deep tendon reflex/nerve stretch intact.  Sensation normal.  Cranial nerves II-XII intact.   Skin:   Normal overall no scars, lesions, ulcers or rashes. No psoriasis.  Psychiatric: Alert and oriented x 3.  Recent memory intact, remote memory unclear.  Normal mood and affect. Well groomed.  Good eye contact.  Cardiovascular: overall no swelling, no varicosities, no edema bilaterally, normal temperatures of the legs and arms, no clubbing, cyanosis and good capillary refill.  Lymphatic: palpation is normal.  She has swollen and ecchymosis of the left great toe  and subungual hematoma present.  NV is intact.  Under sterile technique, a hot metal rod was used to release the subungual hematoma.  Blood came out.  A dressing was applied.  The patient has been educated about the nature of the problem(s) and counseled on treatment options.  The patient appeared to understand what I have discussed and is in agreement with it.  Encounter Diagnoses  Name Primary?  . Subungual hematoma of great toe of left foot, initial encounter   . Pain of joint of left ankle and foot Yes    PLAN Call if any problems.  Precautions discussed.  Continue current medications.   Return to clinic 1 week   Use Neosporin to the toe  Elevate.  Continue pain medicine.  Call if any problem.  Electronically Signed Sanjuana Kava, MD 10/31/20173:45 PM

## 2016-09-05 ENCOUNTER — Encounter: Payer: Self-pay | Admitting: Orthopaedic Surgery

## 2016-09-05 ENCOUNTER — Ambulatory Visit (INDEPENDENT_AMBULATORY_CARE_PROVIDER_SITE_OTHER): Payer: BLUE CROSS/BLUE SHIELD | Admitting: Orthopaedic Surgery

## 2016-09-05 VITALS — BP 129/87 | HR 79 | Temp 97.7°F | Ht 65.0 in | Wt 171.0 lb

## 2016-09-05 DIAGNOSIS — S90212A Contusion of left great toe with damage to nail, initial encounter: Secondary | ICD-10-CM

## 2016-09-05 NOTE — Progress Notes (Signed)
Patient [id number] Guidroz, female DOB:[birth year], 50 y.o. [id number]  Chief Complaint  Patient presents with  . Follow-up    left great toe    HPI  Judith Rodriguez is a 50 y.o. female who had evacuation of left great toe subungual hematoma last visit. She is doing well.  She has little pain today.   HPI  Body mass index is 28.46 kg/m.  ROS  Review of Systems  Constitutional:       Patient does not have Diabetes Mellitus. Patient does not have hypertension. Patient does not have COPD or shortness of breath. Patient does not have BMI > 35. Patient has current smoking history.  HENT: Negative for congestion.   Respiratory: Negative for cough and shortness of breath.   Cardiovascular: Negative for chest pain and leg swelling.  Endocrine: Negative for cold intolerance.  Musculoskeletal: Positive for arthralgias and back pain.  Allergic/Immunologic: Positive for environmental allergies.    Past Medical History:  Diagnosis Date  . Bulging lumbar disc    pushing on sciatic nerve L4  . Carpal tunnel syndrome   . Depression 12/01/2015  . Dyslipidemia 12/05/2015   Will try diet and exercise first  . Hot flashes 12/01/2015  . Vitamin D deficiency 12/05/2015   Take vitamin D3 5000 iu daily    Past Surgical History:  Procedure Laterality Date  . ABDOMINAL HYSTERECTOMY    . CESAREAN SECTION    . SALPINGOOPHORECTOMY Left     Family History  Problem Relation Age of Onset  . Cancer Mother     lung  . Cancer Father     lung  . Hypertension Father   . Hypertension Brother   . Aneurysm Paternal Grandmother     brain  . Heart disease Paternal Grandfather     heart attack  . Hypertension Sister     Social History Social History  Substance Use Topics  . Smoking status: Current Every Day Smoker    Packs/day: 1.00    Years: 31.00    Types: Cigarettes  . Smokeless tobacco: Never Used  . Alcohol use No    Allergies  Allergen Reactions  . Aspirin Hives    Current Outpatient  Prescriptions  Medication Sig Dispense Refill  . Cholecalciferol (VITAMIN D-3 PO) Take by mouth daily.     . citalopram (CELEXA) 20 MG tablet Take 1 tablet (20 mg total) by mouth daily. 30 tablet 6  . diphenhydrAMINE (BENADRYL) 25 mg capsule Take 25 mg by mouth daily.     Marland Kitchen HYDROcodone-acetaminophen (NORCO) 7.5-325 MG tablet Take 1 tablet by mouth every 6 (six) hours as needed for moderate pain (Must last 30 days.Do not drive or operate machinery while taking this medicine.). 100 tablet 0  . Menthol, Topical Analgesic, (BENGAY EX) Apply 1 application topically daily as needed (muscle pain).    . metroNIDAZOLE (FLAGYL) 500 MG tablet Take 1 tablet (500 mg total) by mouth 2 (two) times daily. 14 tablet 0   No current facility-administered medications for this visit.      Physical Exam  Blood pressure 129/87, pulse 79, temperature 97.7 F (36.5 C), height 5\' 5"  (1.651 m), weight 171 lb (77.6 kg).  Constitutional: overall normal hygiene, normal nutrition, well developed, normal grooming, normal body habitus. Assistive device:none  Musculoskeletal: gait and station Limp none, muscle tone and strength are normal, no tremors or atrophy is present.  .  Neurological: coordination overall normal.  Deep tendon reflex/nerve stretch intact.  Sensation normal.  Cranial nerves II-XII intact.  Skin:   Normal overall no scars, lesions, ulcers or rashes. No psoriasis.  Psychiatric: Alert and oriented x 3.  Recent memory intact, remote memory unclear.  Normal mood and affect. Well groomed.  Good eye contact.  Cardiovascular: overall no swelling, no varicosities, no edema bilaterally, normal temperatures of the legs and arms, no clubbing, cyanosis and good capillary refill.  Lymphatic: palpation is normal.  Her left great toe looks good.  There is no blood now under the left great toe nail.  She has no redness.  NV intact.  The patient has been educated about the nature of the problem(s) and  counseled on treatment options.  The patient appeared to understand what I have discussed and is in agreement with it.  Encounter Diagnosis  Name Primary?  . Subungual hematoma of great toe of left foot, initial encounter Yes    PLAN Call if any problems.  Precautions discussed.  Continue current medications.   Return to clinic September 27, 2016   Electronically Signed Sanjuana Kava, MD 11/8/20172:38 PM

## 2016-09-11 ENCOUNTER — Telehealth: Payer: Self-pay | Admitting: Orthopaedic Surgery

## 2016-09-11 ENCOUNTER — Other Ambulatory Visit: Payer: Self-pay | Admitting: *Deleted

## 2016-09-11 MED ORDER — HYDROCODONE-ACETAMINOPHEN 7.5-325 MG PO TABS: 1.0000 | ORAL_TABLET | Freq: Four times a day (QID) | ORAL | 0 refills | Status: DC | PRN

## 2016-09-11 NOTE — Telephone Encounter (Signed)
Hydrocodone-Acetaminophen 7.5/325mg   Qty 100 Tablets  Take 1 tablet by mouth every 6 (six) hours as needed for moderate pain  (Must last 30 days. Do not drive or operate machinery while taking this medicine.).

## 2016-09-27 ENCOUNTER — Ambulatory Visit (INDEPENDENT_AMBULATORY_CARE_PROVIDER_SITE_OTHER): Payer: BLUE CROSS/BLUE SHIELD | Admitting: Orthopaedic Surgery

## 2016-09-27 ENCOUNTER — Encounter: Payer: Self-pay | Admitting: Orthopaedic Surgery

## 2016-09-27 VITALS — BP 126/82 | HR 94 | Temp 97.7°F | Ht 64.5 in | Wt 173.0 lb

## 2016-09-27 DIAGNOSIS — M25562 Pain in left knee: Secondary | ICD-10-CM

## 2016-09-27 DIAGNOSIS — G8929 Other chronic pain: Secondary | ICD-10-CM

## 2016-09-27 DIAGNOSIS — M25561 Pain in right knee: Secondary | ICD-10-CM

## 2016-09-27 DIAGNOSIS — F17210 Nicotine dependence, cigarettes, uncomplicated: Secondary | ICD-10-CM | POA: Diagnosis not present

## 2016-09-27 DIAGNOSIS — M545 Low back pain, unspecified: Secondary | ICD-10-CM

## 2016-09-27 DIAGNOSIS — M25551 Pain in right hip: Secondary | ICD-10-CM

## 2016-09-27 DIAGNOSIS — M25552 Pain in left hip: Secondary | ICD-10-CM

## 2016-09-27 MED ORDER — PREDNISONE 5 MG (21) PO TBPK: ORAL_TABLET | ORAL | 0 refills | Status: DC

## 2016-09-27 NOTE — Progress Notes (Signed)
Patient Judith Rodriguez, female DOB:[birth year], 50 y.o. [id number]  Chief Complaint  Patient presents with  . Follow-up    back pain    HPI  Judith Rodriguez is a 50 y.o. female who has chronic back pain with no sciatica.  She has multiple arthralgias with knee and hip pain as well. She cannot take any NSAID.  She is hurting.  I will set up rheumatology appointment for her as I am limited in medicines to treat.  She is agreeable.  I will give prednisone dose pack today.  HPI  Body mass index is 29.24 kg/m.  ROS  Review of Systems  Constitutional:       Patient does not have Diabetes Mellitus. Patient does not have hypertension. Patient does not have COPD or shortness of breath. Patient does not have BMI > 35. Patient has current smoking history.  HENT: Negative for congestion.   Respiratory: Negative for cough and shortness of breath.   Cardiovascular: Negative for chest pain and leg swelling.  Endocrine: Negative for cold intolerance.  Musculoskeletal: Positive for arthralgias and back pain.  Allergic/Immunologic: Positive for environmental allergies.    Past Medical History:  Diagnosis Date  . Bulging lumbar disc    pushing on sciatic nerve L4  . Carpal tunnel syndrome   . Depression 12/01/2015  . Dyslipidemia 12/05/2015   Will try diet and exercise first  . Hot flashes 12/01/2015  . Vitamin D deficiency 12/05/2015   Take vitamin D3 5000 iu daily    Past Surgical History:  Procedure Laterality Date  . ABDOMINAL HYSTERECTOMY    . CESAREAN SECTION    . SALPINGOOPHORECTOMY Left     Family History  Problem Relation Age of Onset  . Cancer Mother     lung  . Cancer Father     lung  . Hypertension Father   . Hypertension Brother   . Aneurysm Paternal Grandmother     brain  . Heart disease Paternal Grandfather     heart attack  . Hypertension Sister     Social History Social History  Substance Use Topics  . Smoking status: Current Every Day Smoker    Packs/day:  1.00    Years: 31.00    Types: Cigarettes  . Smokeless tobacco: Never Used  . Alcohol use No    Allergies  Allergen Reactions  . Aspirin Hives    Current Outpatient Prescriptions  Medication Sig Dispense Refill  . Cholecalciferol (VITAMIN D-3 PO) Take by mouth daily.     . citalopram (CELEXA) 20 MG tablet Take 1 tablet (20 mg total) by mouth daily. 30 tablet 6  . diphenhydrAMINE (BENADRYL) 25 mg capsule Take 25 mg by mouth daily.     Marland Kitchen HYDROcodone-acetaminophen (NORCO) 7.5-325 MG tablet Take 1 tablet by mouth every 6 (six) hours as needed for moderate pain (Must last 30 days.Do not drive or operate machinery while taking this medicine.). 100 tablet 0  . Menthol, Topical Analgesic, (BENGAY EX) Apply 1 application topically daily as needed (muscle pain).    . metroNIDAZOLE (FLAGYL) 500 MG tablet Take 1 tablet (500 mg total) by mouth 2 (two) times daily. 14 tablet 0  . predniSONE (STERAPRED UNI-PAK 21 TAB) 5 MG (21) TBPK tablet Take 6 pills first day; 5 pills second day; 4 pills third day; 3 pills fourth day; 2 pills next day and 1 pill last day. 21 tablet 0   No current facility-administered medications for this visit.      Physical Exam  Blood pressure 126/82, pulse 94, temperature 97.7 F (36.5 C), height 5' 4.5" (1.638 m), weight 173 lb (78.5 kg).  Constitutional: overall normal hygiene, normal nutrition, well developed, normal grooming, normal body habitus. Assistive device:none  Musculoskeletal: gait and station Limp none, muscle tone and strength are normal, no tremors or atrophy is present.  .  Neurological: coordination overall normal.  Deep tendon reflex/nerve stretch intact.  Sensation normal.  Cranial nerves II-XII intact.   Skin:   Normal overall no scars, lesions, ulcers or rashes. No psoriasis.  Psychiatric: Alert and oriented x 3.  Recent memory intact, remote memory unclear.  Normal mood and affect. Well groomed.  Good eye contact.  Cardiovascular: overall no  swelling, no varicosities, no edema bilaterally, normal temperatures of the legs and arms, no clubbing, cyanosis and good capillary refill.  Lymphatic: palpation is normal.  Spine/Pelvis examination:  Inspection:  Overall, sacoiliac joint benign and hips nontender; without crepitus or defects.   Thoracic spine inspection: Alignment normal without kyphosis present   Lumbar spine inspection:  Alignment  with normal lumbar lordosis, without scoliosis apparent.   Thoracic spine palpation:  without tenderness of spinal processes   Lumbar spine palpation: with tenderness of lumbar area; without tightness of lumbar muscles    Range of Motion:   Lumbar flexion, forward flexion is 45 without pain or tenderness    Lumbar extension is 10 without pain or tenderness   Left lateral bend is Normal  without pain or tenderness   Right lateral bend is Normal without pain or tenderness   Straight leg raising is Normal   Strength & tone: Normal   Stability overall normal stability     The patient has been educated about the nature of the problem(s) and counseled on treatment options.  The patient appeared to understand what I have discussed and is in agreement with it.  Encounter Diagnoses  Name Primary?  . Chronic midline low back pain without sciatica   . Chronic arthralgias of knees and hips Yes  . Cigarette nicotine dependence without complication     PLAN Call if any problems.  Precautions discussed.  Continue current medications.   Return to clinic 6 weeks   To see rheumatologist.  Electronically Signed Sanjuana Kava, MD 11/30/20178:53 AM

## 2016-09-27 NOTE — Addendum Note (Signed)
Addended by: Tressa Busman L on: 09/27/2016 10:19 AM   Modules accepted: Orders

## 2016-09-27 NOTE — Patient Instructions (Signed)
Steps to Quit Smoking Smoking tobacco can be bad for your health. It can also affect almost every organ in your body. Smoking puts you and people around you at risk for many serious long-lasting (chronic) diseases. Quitting smoking is hard, but it is one of the best things that you can do for your health. It is never too late to quit. What are the benefits of quitting smoking? When you quit smoking, you lower your risk for getting serious diseases and conditions. They can include:  Lung cancer or lung disease.  Heart disease.  Stroke.  Heart attack.  Not being able to have children (infertility).  Weak bones (osteoporosis) and broken bones (fractures). If you have coughing, wheezing, and shortness of breath, those symptoms may get better when you quit. You may also get sick less often. If you are pregnant, quitting smoking can help to lower your chances of having a baby of low birth weight. What can I do to help me quit smoking? Talk with your doctor about what can help you quit smoking. Some things you can do (strategies) include:  Quitting smoking totally, instead of slowly cutting back how much you smoke over a period of time.  Going to in-person counseling. You are more likely to quit if you go to many counseling sessions.  Using resources and support systems, such as:  Online chats with a counselor.  Phone quitlines.  Printed self-help materials.  Support groups or group counseling.  Text messaging programs.  Mobile phone apps or applications.  Taking medicines. Some of these medicines may have nicotine in them. If you are pregnant or breastfeeding, do not take any medicines to quit smoking unless your doctor says it is okay. Talk with your doctor about counseling or other things that can help you. Talk with your doctor about using more than one strategy at the same time, such as taking medicines while you are also going to in-person counseling. This can help make quitting  easier. What things can I do to make it easier to quit? Quitting smoking might feel very hard at first, but there is a lot that you can do to make it easier. Take these steps:  Talk to your family and friends. Ask them to support and encourage you.  Call phone quitlines, reach out to support groups, or work with a counselor.  Ask people who smoke to not smoke around you.  Avoid places that make you want (trigger) to smoke, such as:  Bars.  Parties.  Smoke-break areas at work.  Spend time with people who do not smoke.  Lower the stress in your life. Stress can make you want to smoke. Try these things to help your stress:  Getting regular exercise.  Deep-breathing exercises.  Yoga.  Meditating.  Doing a body scan. To do this, close your eyes, focus on one area of your body at a time from head to toe, and notice which parts of your body are tense. Try to relax the muscles in those areas.  Download or buy apps on your mobile phone or tablet that can help you stick to your quit plan. There are many free apps, such as QuitGuide from the CDC (Centers for Disease Control and Prevention). You can find more support from smokefree.gov and other websites. This information is not intended to replace advice given to you by your health care provider. Make sure you discuss any questions you have with your health care provider. Document Released: 08/11/2009 Document Revised: 06/12/2016 Document   Reviewed: 03/01/2015 Elsevier Interactive Patient Education  2017 Elsevier Inc.  

## 2016-09-28 LAB — COMPREHENSIVE METABOLIC PANEL
ALK PHOS: 106 U/L (ref 33–130)
ALT: 12 U/L (ref 6–29)
AST: 14 U/L (ref 10–35)
Albumin: 4.3 g/dL (ref 3.6–5.1)
BILIRUBIN TOTAL: 0.5 mg/dL (ref 0.2–1.2)
BUN: 9 mg/dL (ref 7–25)
CALCIUM: 9.3 mg/dL (ref 8.6–10.4)
CO2: 28 mmol/L (ref 20–31)
CREATININE: 0.74 mg/dL (ref 0.50–1.05)
Chloride: 105 mmol/L (ref 98–110)
GLUCOSE: 98 mg/dL (ref 65–99)
Potassium: 4.4 mmol/L (ref 3.5–5.3)
SODIUM: 140 mmol/L (ref 135–146)
Total Protein: 7 g/dL (ref 6.1–8.1)

## 2016-09-28 LAB — CBC WITH DIFFERENTIAL/PLATELET
BASOS PCT: 0 %
Basophils Absolute: 0 cells/uL (ref 0–200)
EOS PCT: 1 %
Eosinophils Absolute: 106 cells/uL (ref 15–500)
HEMATOCRIT: 44 % (ref 35.0–45.0)
Hemoglobin: 14.6 g/dL (ref 11.7–15.5)
LYMPHS ABS: 2438 {cells}/uL (ref 850–3900)
LYMPHS PCT: 23 %
MCH: 31.7 pg (ref 27.0–33.0)
MCHC: 33.2 g/dL (ref 32.0–36.0)
MCV: 95.4 fL (ref 80.0–100.0)
MONO ABS: 530 {cells}/uL (ref 200–950)
MPV: 10.7 fL (ref 7.5–12.5)
Monocytes Relative: 5 %
NEUTROS PCT: 71 %
Neutro Abs: 7526 cells/uL (ref 1500–7800)
Platelets: 325 10*3/uL (ref 140–400)
RBC: 4.61 MIL/uL (ref 3.80–5.10)
RDW: 13.2 % (ref 11.0–15.0)
WBC: 10.6 10*3/uL (ref 3.8–10.8)

## 2016-09-28 LAB — URIC ACID: Uric Acid, Serum: 4.4 mg/dL (ref 2.5–7.0)

## 2016-09-28 LAB — SEDIMENTATION RATE: SED RATE: 1 mm/h (ref 0–30)

## 2016-10-01 LAB — C-REACTIVE PROTEIN: CRP: 3 mg/L (ref <8.0)

## 2016-10-01 LAB — RHEUMATOID FACTOR: Rhuematoid fact SerPl-aCnc: <14 IU/mL (ref <14)

## 2016-10-01 LAB — CYCLIC CITRUL PEPTIDE ANTIBODY, IGG

## 2016-10-02 LAB — ANA: Anti Nuclear Antibody(ANA): NEGATIVE

## 2016-10-04 ENCOUNTER — Telehealth: Payer: Self-pay | Admitting: Orthopaedic Surgery

## 2016-10-04 NOTE — Telephone Encounter (Signed)
I spoke with the patient and informed her that the information was faxed to Dr. Estanislado Pandy and she is to expect a call from [facility] to schedule it after the Dr. Shan Levans the visit.

## 2016-10-04 NOTE — Telephone Encounter (Signed)
Patient called asking if we had heard from her rheumatologist, because she had not.  Please advise

## 2016-10-10 ENCOUNTER — Telehealth: Payer: Self-pay | Admitting: Orthopaedic Surgery

## 2016-10-10 MED ORDER — HYDROCODONE-ACETAMINOPHEN 7.5-325 MG PO TABS: 1.0000 | ORAL_TABLET | Freq: Four times a day (QID) | ORAL | 0 refills | Status: DC | PRN

## 2016-10-10 NOTE — Telephone Encounter (Signed)
Patient requests a refill on Hydrocodone/Acetaminophen(Norco)  7.5-325  Mgs.   Qty  100        Sig: Take 1 tablet by mouth every 6 (six) hours as needed for moderate pain (Must last 30 days.Do not drive or operate machinery while taking this medicine.).

## 2016-10-18 ENCOUNTER — Telehealth: Payer: Self-pay | Admitting: *Deleted

## 2016-10-18 ENCOUNTER — Other Ambulatory Visit: Payer: Self-pay | Admitting: Advanced Practice Midwife

## 2016-10-18 MED ORDER — CITALOPRAM HYDROBROMIDE 20 MG PO TABS: 20.0000 mg | ORAL_TABLET | Freq: Every day | ORAL | 0 refills | Status: DC

## 2016-10-18 NOTE — Telephone Encounter (Signed)
Refilled for one month. See JAG for future refills (out of office today and tomorrow0

## 2016-10-18 NOTE — Telephone Encounter (Signed)
Spoke with pt. Pt needs a refill on Celexa 20 mg. She takes it at bedtime. Thanks!! [place]

## 2016-11-07 ENCOUNTER — Telehealth: Payer: Self-pay | Admitting: Orthopaedic Surgery

## 2016-11-07 ENCOUNTER — Other Ambulatory Visit: Payer: Self-pay | Admitting: *Deleted

## 2016-11-07 MED ORDER — HYDROCODONE-ACETAMINOPHEN 7.5-325 MG PO TABS: 1.0000 | ORAL_TABLET | Freq: Four times a day (QID) | ORAL | 0 refills | Status: DC | PRN

## 2016-11-07 NOTE — Telephone Encounter (Signed)
Patient of Dr. Brooke Bonito requesting refill on Hydrocodone/Acetaminophen 7.5-325 mgs Qty  90  Sig: Take 1 tablet by mouth every 6 (six) hours as needed for moderate pain (Must last 30 days.Do not drive or operate machinery while taking this medicine.).

## 2016-11-08 ENCOUNTER — Ambulatory Visit: Payer: BLUE CROSS/BLUE SHIELD | Admitting: Orthopaedic Surgery

## 2016-11-21 ENCOUNTER — Other Ambulatory Visit: Payer: Self-pay | Admitting: Advanced Practice Midwife

## 2016-11-21 ENCOUNTER — Ambulatory Visit: Payer: BLUE CROSS/BLUE SHIELD | Admitting: Orthopaedic Surgery

## 2016-12-04 ENCOUNTER — Encounter: Payer: Self-pay | Admitting: Orthopaedic Surgery

## 2016-12-04 ENCOUNTER — Ambulatory Visit (INDEPENDENT_AMBULATORY_CARE_PROVIDER_SITE_OTHER): Payer: BLUE CROSS/BLUE SHIELD | Admitting: Orthopaedic Surgery

## 2016-12-04 VITALS — BP 124/79 | HR 95 | Temp 97.3°F | Ht 64.5 in | Wt 170.0 lb

## 2016-12-04 DIAGNOSIS — M25562 Pain in left knee: Secondary | ICD-10-CM

## 2016-12-04 DIAGNOSIS — M545 Low back pain: Secondary | ICD-10-CM | POA: Diagnosis not present

## 2016-12-04 DIAGNOSIS — M25551 Pain in right hip: Secondary | ICD-10-CM | POA: Diagnosis not present

## 2016-12-04 DIAGNOSIS — M25552 Pain in left hip: Secondary | ICD-10-CM

## 2016-12-04 DIAGNOSIS — M25561 Pain in right knee: Secondary | ICD-10-CM

## 2016-12-04 DIAGNOSIS — F17210 Nicotine dependence, cigarettes, uncomplicated: Secondary | ICD-10-CM | POA: Diagnosis not present

## 2016-12-04 DIAGNOSIS — G8929 Other chronic pain: Secondary | ICD-10-CM

## 2016-12-04 NOTE — Patient Instructions (Signed)
Steps to Quit Smoking Smoking tobacco can be bad for your health. It can also affect almost every organ in your body. Smoking puts you and people around you at risk for many serious Judith Rodriguez-lasting (chronic) diseases. Quitting smoking is hard, but it is one of the best things that you can do for your health. It is never too late to quit. What are the benefits of quitting smoking? When you quit smoking, you lower your risk for getting serious diseases and conditions. They can include:  Lung cancer or lung disease.  Heart disease.  Stroke.  Heart attack.  Not being able to have children (infertility).  Weak bones (osteoporosis) and broken bones (fractures). If you have coughing, wheezing, and shortness of breath, those symptoms may get better when you quit. You may also get sick less often. If you are pregnant, quitting smoking can help to lower your chances of having a baby of low birth weight. What can I do to help me quit smoking? Talk with your doctor about what can help you quit smoking. Some things you can do (strategies) include:  Quitting smoking totally, instead of slowly cutting back how much you smoke over a period of time.  Going to in-person counseling. You are more likely to quit if you go to many counseling sessions.  Using resources and support systems, such as:  Online chats with a counselor.  Phone quitlines.  Printed self-help materials.  Support groups or group counseling.  Text messaging programs.  Mobile phone apps or applications.  Taking medicines. Some of these medicines may have nicotine in them. If you are pregnant or breastfeeding, do not take any medicines to quit smoking unless your doctor says it is okay. Talk with your doctor about counseling or other things that can help you. Talk with your doctor about using more than one strategy at the same time, such as taking medicines while you are also going to in-person counseling. This can help make quitting  easier. What things can I do to make it easier to quit? Quitting smoking might feel very hard at first, but there is a lot that you can do to make it easier. Take these steps:  Talk to your family and friends. Ask them to support and encourage you.  Call phone quitlines, reach out to support groups, or work with a counselor.  Ask people who smoke to not smoke around you.  Avoid places that make you want (trigger) to smoke, such as:  Bars.  Parties.  Smoke-break areas at work.  Spend time with people who do not smoke.  Lower the stress in your life. Stress can make you want to smoke. Try these things to help your stress:  Getting regular exercise.  Deep-breathing exercises.  Yoga.  Meditating.  Doing a body scan. To do this, close your eyes, focus on one area of your body at a time from head to toe, and notice which parts of your body are tense. Try to relax the muscles in those areas.  Download or buy apps on your mobile phone or tablet that can help you stick to your quit plan. There are many free apps, such as QuitGuide from the CDC (Centers for Disease Control and Prevention). You can find more support from smokefree.gov and other websites. This information is not intended to replace advice given to you by your health care provider. Make sure you discuss any questions you have with your health care provider. Document Released: 08/11/2009 Document Revised: 06/12/2016 Document   Reviewed: 03/01/2015 Elsevier Interactive Patient Education  2017 Elsevier Inc.  

## 2016-12-04 NOTE — Progress Notes (Signed)
Patient [id number] [name], female DOB:August 31, 1966, 51 y.o. [id number]  Chief Complaint  Patient presents with  . Follow-up    Chronic arthralgias of knees and hips    HPI  Judith Rodriguez is a 51 y.o. female who has chronic arthralgias of the knees and hips.  She is being followed by rheumatology.  She was seen by them last week.  She has no trauma, no acute episodes.  She is doing well. She is taking her medicine.  She continues to smoke and is unwilling to stop. HPI  Body mass index is 28.73 kg/m.  ROS  Review of Systems  Constitutional:       Patient does not have Diabetes Mellitus. Patient does not have hypertension. Patient does not have COPD or shortness of breath. Patient does not have BMI > 35. Patient has current smoking history.  HENT: Negative for congestion.   Respiratory: Negative for cough and shortness of breath.   Cardiovascular: Negative for chest pain and leg swelling.  Endocrine: Negative for cold intolerance.  Musculoskeletal: Positive for arthralgias and back pain.  Allergic/Immunologic: Positive for environmental allergies.    Past Medical History:  Diagnosis Date  . Bulging lumbar disc    pushing on sciatic nerve L4  . Carpal tunnel syndrome   . Depression 12/01/2015  . Dyslipidemia 12/05/2015   Will try diet and exercise first  . Hot flashes 12/01/2015  . Vitamin D deficiency 12/05/2015   Take vitamin D3 5000 iu daily    Past Surgical History:  Procedure Laterality Date  . ABDOMINAL HYSTERECTOMY    . CESAREAN SECTION    . SALPINGOOPHORECTOMY Left     Family History  Problem Relation Age of Onset  . Cancer Mother     lung  . Cancer Father     lung  . Hypertension Father   . Hypertension Brother   . Aneurysm Paternal Grandmother     brain  . Heart disease Paternal Grandfather     heart attack  . Hypertension Sister     Social History Social History  Substance Use Topics  . Smoking status: Current Every Day Smoker    Packs/day: 1.00   Years: 31.00    Types: Cigarettes  . Smokeless tobacco: Never Used  . Alcohol use No    Allergies  Allergen Reactions  . Aspirin Hives    Current Outpatient Prescriptions  Medication Sig Dispense Refill  . Cholecalciferol (VITAMIN D-3 PO) Take by mouth daily.     . citalopram (CELEXA) 20 MG tablet take 1 tablet by mouth once daily 30 tablet 1  . diphenhydrAMINE (BENADRYL) 25 mg capsule Take 25 mg by mouth daily.     Marland Kitchen HYDROcodone-acetaminophen (NORCO) 7.5-325 MG tablet Take 1 tablet by mouth every 6 (six) hours as needed for moderate pain (Must last 30 days.Do not drive or operate machinery while taking this medicine.). 90 tablet 0  . Menthol, Topical Analgesic, (BENGAY EX) Apply 1 application topically daily as needed (muscle pain).    . metroNIDAZOLE (FLAGYL) 500 MG tablet Take 1 tablet (500 mg total) by mouth 2 (two) times daily. 14 tablet 0  . predniSONE (STERAPRED UNI-PAK 21 TAB) 5 MG (21) TBPK tablet Take 6 pills first day; 5 pills second day; 4 pills third day; 3 pills fourth day; 2 pills next day and 1 pill last day. 21 tablet 0   No current facility-administered medications for this visit.      Physical Exam  Blood pressure 124/79, pulse 95, temperature 97.3  F (36.3 C), height 5' 4.5" (1.638 m), weight 170 lb (77.1 kg).  Constitutional: overall normal hygiene, normal nutrition, well developed, normal grooming, normal body habitus. Assistive device:none  Musculoskeletal: gait and station Limp none, muscle tone and strength are normal, no tremors or atrophy is present.  .  Neurological: coordination overall normal.  Deep tendon reflex/nerve stretch intact.  Sensation normal.  Cranial nerves II-XII intact.   Skin:   Normal overall no scars, lesions, ulcers or rashes. No psoriasis.  Psychiatric: Alert and oriented x 3.  Recent memory intact, remote memory unclear.  Normal mood and affect. Well groomed.  Good eye contact.  Cardiovascular: overall no swelling, no  varicosities, no edema bilaterally, normal temperatures of the legs and arms, no clubbing, cyanosis and good capillary refill.  Lymphatic: palpation is normal.  Spine/Pelvis examination:  Inspection:  Overall, sacoiliac joint benign and hips nontender; without crepitus or defects.   Thoracic spine inspection: Alignment normal without kyphosis present   Lumbar spine inspection:  Alignment  with normal lumbar lordosis, without scoliosis apparent.   Thoracic spine palpation:  without tenderness of spinal processes   Lumbar spine palpation: with tenderness of lumbar area; without tightness of lumbar muscles    Range of Motion:   Lumbar flexion, forward flexion is 45 without pain or tenderness    Lumbar extension is 10 without pain or tenderness   Left lateral bend is Normal  without pain or tenderness   Right lateral bend is Normal without pain or tenderness   Straight leg raising is Normal   Strength & tone: Normal   Stability overall normal stability     The patient has been educated about the nature of the problem(s) and counseled on treatment options.  The patient appeared to understand what I have discussed and is in agreement with it.  Encounter Diagnoses  Name Primary?  . Chronic midline low back pain without sciatica Yes  . Chronic arthralgias of knees and hips   . Cigarette nicotine dependence without complication     PLAN Call if any problems.  Precautions discussed.  Continue current medications.   Return to clinic 3 months   Electronically Signed Sanjuana Kava, MD 2/6/20189:07 AM

## 2016-12-05 ENCOUNTER — Telehealth: Payer: Self-pay | Admitting: Orthopaedic Surgery

## 2016-12-05 ENCOUNTER — Ambulatory Visit: Payer: BLUE CROSS/BLUE SHIELD | Admitting: Adult Health

## 2016-12-05 MED ORDER — HYDROCODONE-ACETAMINOPHEN 7.5-325 MG PO TABS: 1.0000 | ORAL_TABLET | Freq: Four times a day (QID) | ORAL | 0 refills | Status: DC | PRN

## 2016-12-05 NOTE — Telephone Encounter (Signed)
Pt requests refill of Hydrocodone  [phone]

## 2016-12-17 ENCOUNTER — Ambulatory Visit (INDEPENDENT_AMBULATORY_CARE_PROVIDER_SITE_OTHER): Payer: BLUE CROSS/BLUE SHIELD | Admitting: Adult Health

## 2016-12-17 ENCOUNTER — Encounter: Payer: Self-pay | Admitting: Adult Health

## 2016-12-17 VITALS — BP 120/70 | HR 98 | Ht 64.5 in | Wt 172.5 lb

## 2016-12-17 DIAGNOSIS — E785 Hyperlipidemia, unspecified: Secondary | ICD-10-CM

## 2016-12-17 DIAGNOSIS — R232 Flushing: Secondary | ICD-10-CM

## 2016-12-17 DIAGNOSIS — N951 Menopausal and female climacteric states: Secondary | ICD-10-CM

## 2016-12-17 DIAGNOSIS — F329 Major depressive disorder, single episode, unspecified: Secondary | ICD-10-CM | POA: Diagnosis not present

## 2016-12-17 DIAGNOSIS — F32A Depression, unspecified: Secondary | ICD-10-CM

## 2016-12-17 MED ORDER — CITALOPRAM HYDROBROMIDE 20 MG PO TABS: 20.0000 mg | ORAL_TABLET | Freq: Every day | ORAL | 6 refills | Status: DC

## 2016-12-17 MED ORDER — ESTROGENS, CONJUGATED 0.625 MG/GM VA CREA
TOPICAL_CREAM | VAGINAL | 0 refills | Status: DC
Start: 2016-12-17 — End: 2017-06-17

## 2016-12-17 NOTE — Progress Notes (Signed)
Subjective:     Patient ID: Judith Rodriguez, female   DOB: November 23, 1965, 51 y.o.   MRN: [id number]  HPI Judith Rodriguez is a 51 year old white female in to get celexa refilled and is having decreased sex drive and vaginal dryness, she can't remember last time had sex. She is sp hysterectomy.   Review of Systems +vaginal dryness Decrease libido Reviewed past medical,surgical, social and family history. Reviewed medications and allergies.     Objective:   Physical Exam BP 120/70 (BP Location: Left Arm, Patient Position: Sitting, Cuff Size: Normal)   Pulse 98   Ht 5' 4.5" (1.638 m)   Wt 172 lb 8 oz (78.2 kg)   BMI 29.15 kg/m  Skin warm and dry. Neck: mid line trachea, normal thyroid, good ROM, no lymphadenopathy noted. Lungs: clear to ausculation bilaterally. Cardiovascular: regular rate and rhythm. PHQ 9 score 5, feels better when on celexa, it is her "happy pill". Declines labs today or exam, will try PVC and get back in in 6 months for physical and labs.    Assessment:     1. Depression, unspecified depression type   2. Hot flashes   3. Dyslipidemia   4. Vaginal dryness, menopausal       Plan:     Meds ordered this encounter  Medications  . citalopram (CELEXA) 20 MG tablet    Sig: Take 1 tablet (20 mg total) by mouth daily.    Dispense:  30 tablet    Refill:  6    Order Specific Question:   Supervising Provider    Answer:   Elonda Husky, LUTHER H [[id number]]  . conjugated estrogens (PREMARIN) vaginal cream    Sig: Use .5 gm in vagina at hs x 2 weeks then 2-3 x weekly    Dispense:  72 g    Refill:  0    Order Specific Question:   Supervising Provider    Answer:   Florian Buff [[id number]]  Get mammogram now Return in 6 months for physical and labs  Use good lubricate with sex and try to have more often,even consider sex toy

## 2016-12-21 ENCOUNTER — Other Ambulatory Visit: Payer: Self-pay | Admitting: Obstetrics and Gynecology

## 2016-12-21 DIAGNOSIS — Z1231 Encounter for screening mammogram for malignant neoplasm of breast: Secondary | ICD-10-CM

## 2016-12-31 ENCOUNTER — Ambulatory Visit (HOSPITAL_COMMUNITY)
Admission: RE | Admit: 2016-12-31 | Discharge: 2016-12-31 | Disposition: A | Discharge status: Home / Self Care | Payer: BLUE CROSS/BLUE SHIELD | Source: Ambulatory Visit | Attending: Obstetrics and Gynecology | Admitting: Obstetrics and Gynecology

## 2016-12-31 DIAGNOSIS — Z1231 Encounter for screening mammogram for malignant neoplasm of breast: Secondary | ICD-10-CM | POA: Diagnosis present

## 2017-01-07 ENCOUNTER — Telehealth: Payer: Self-pay | Admitting: Orthopaedic Surgery

## 2017-01-07 MED ORDER — HYDROCODONE-ACETAMINOPHEN 7.5-325 MG PO TABS: 1.0000 | ORAL_TABLET | Freq: Four times a day (QID) | ORAL | 0 refills | Status: DC | PRN

## 2017-01-07 NOTE — Telephone Encounter (Signed)
Hydrocodone-Acetaminophen  7.5/325mg   Qty  80 Tablets

## 2017-02-06 ENCOUNTER — Telehealth: Payer: Self-pay | Admitting: Orthopaedic Surgery

## 2017-02-06 MED ORDER — HYDROCODONE-ACETAMINOPHEN 7.5-325 MG PO TABS: 1.0000 | ORAL_TABLET | Freq: Four times a day (QID) | ORAL | 0 refills | Status: DC | PRN

## 2017-02-06 NOTE — Telephone Encounter (Signed)
Patient requests refill on Hydrocodone/Acetaminophen 7.5-325  mgs.   Qty  60  Sig: Take 1 tablet by mouth every 6 (six) hours as needed for moderate pain (Must last 30 days.Do not drive or operate machinery while taking this medicine.).

## 2017-03-05 ENCOUNTER — Encounter: Payer: Self-pay | Admitting: Orthopaedic Surgery

## 2017-03-05 ENCOUNTER — Ambulatory Visit (INDEPENDENT_AMBULATORY_CARE_PROVIDER_SITE_OTHER): Payer: BLUE CROSS/BLUE SHIELD | Admitting: Orthopaedic Surgery

## 2017-03-05 VITALS — BP 149/77 | HR 94 | Ht 64.0 in | Wt 172.0 lb

## 2017-03-05 DIAGNOSIS — G8929 Other chronic pain: Secondary | ICD-10-CM

## 2017-03-05 DIAGNOSIS — M5442 Lumbago with sciatica, left side: Secondary | ICD-10-CM

## 2017-03-05 MED ORDER — HYDROCODONE-ACETAMINOPHEN 7.5-325 MG PO TABS: 1.0000 | ORAL_TABLET | Freq: Four times a day (QID) | ORAL | 0 refills | Status: DC | PRN

## 2017-03-05 NOTE — Progress Notes (Signed)
Patient Judith Rodriguez, female DOB:04/27/1966, 51 y.o. [id number]  Chief Complaint  Patient presents with  . Back Pain    follow up    HPI  Judith Rodriguez is a 51 y.o. female who has chronic lower back pain with left sided sciatica.  She has no new trauma, no redness or weakness.  She is doing her exercises at home and taking her medicine.  She has had epidurals and is scheduled to see about it next week also. HPI  Body mass index is 29.52 kg/m.  ROS  Review of Systems  Constitutional:       Patient does not have Diabetes Mellitus. Patient does not have hypertension. Patient does not have COPD or shortness of breath. Patient does not have BMI > 35. Patient has current smoking history.  HENT: Negative for congestion.   Respiratory: Negative for cough and shortness of breath.   Cardiovascular: Negative for chest pain and leg swelling.  Endocrine: Negative for cold intolerance.  Musculoskeletal: Positive for arthralgias and back pain.  Allergic/Immunologic: Positive for environmental allergies.    Past Medical History:  Diagnosis Date  . Arthritis   . Bulging lumbar disc    pushing on sciatic nerve L4  . Carpal tunnel syndrome   . Depression 12/01/2015  . Dyslipidemia 12/05/2015   Will try diet and exercise first  . Hot flashes 12/01/2015  . Vitamin D deficiency 12/05/2015   Take vitamin D3 5000 iu daily    Past Surgical History:  Procedure Laterality Date  . ABDOMINAL HYSTERECTOMY    . CESAREAN SECTION    . SALPINGOOPHORECTOMY Left     Family History  Problem Relation Age of Onset  . Cancer Mother     lung  . Cancer Father     lung  . Hypertension Father   . Hypertension Brother   . Aneurysm Paternal Grandmother     brain  . Heart disease Paternal Grandfather     heart attack  . Hypertension Sister     Social History Social History  Substance Use Topics  . Smoking status: Current Every Day Smoker    Packs/day: 1.00    Years: 33.00    Types: Cigarettes  .  Smokeless tobacco: Never Used  . Alcohol use No    Allergies  Allergen Reactions  . Aspirin Hives    Current Outpatient Prescriptions  Medication Sig Dispense Refill  . Cholecalciferol (VITAMIN D-3 PO) Take by mouth daily.     . citalopram (CELEXA) 20 MG tablet Take 1 tablet (20 mg total) by mouth daily. 30 tablet 6  . conjugated estrogens (PREMARIN) vaginal cream Use .5 gm in vagina at hs x 2 weeks then 2-3 x weekly 72 g 0  . diphenhydrAMINE (BENADRYL) 25 mg capsule Take 25 mg by mouth daily.     Marland Kitchen HYDROcodone-acetaminophen (NORCO) 7.5-325 MG tablet Take 1 tablet by mouth every 6 (six) hours as needed for moderate pain (Must last 30 days.Do not drive or operate machinery while taking this medicine.). 50 tablet 0  . Menthol, Topical Analgesic, (BENGAY EX) Apply 1 application topically daily as needed (muscle pain).     No current facility-administered medications for this visit.      Physical Exam  Blood pressure (!) 149/77, pulse 94, height 5\' 4"  (1.626 m), weight 172 lb (78 kg).  Constitutional: overall normal hygiene, normal nutrition, well developed, normal grooming, normal body habitus. Assistive device:none  Musculoskeletal: gait and station Limp none, muscle tone and strength are normal, no  tremors or atrophy is present.  .  Neurological: coordination overall normal.  Deep tendon reflex/nerve stretch intact.  Sensation normal.  Cranial nerves II-XII intact.   Skin:   Normal overall no scars, lesions, ulcers or rashes. No psoriasis.  Psychiatric: Alert and oriented x 3.  Recent memory intact, remote memory unclear.  Normal mood and affect. Well groomed.  Good eye contact.  Cardiovascular: overall no swelling, no varicosities, no edema bilaterally, normal temperatures of the legs and arms, no clubbing, cyanosis and good capillary refill.  Lymphatic: palpation is normal.  Spine/Pelvis examination:  Inspection:  Overall, sacoiliac joint benign and hips nontender;  without crepitus or defects.   Thoracic spine inspection: Alignment normal without kyphosis present   Lumbar spine inspection:  Alignment  with normal lumbar lordosis, without scoliosis apparent.   Thoracic spine palpation:  without tenderness of spinal processes   Lumbar spine palpation: with tenderness of lumbar area; without tightness of lumbar muscles    Range of Motion:   Lumbar flexion, forward flexion is 45 without pain or tenderness    Lumbar extension is 10 without pain or tenderness   Left lateral bend is Normal  without pain or tenderness   Right lateral bend is Normal without pain or tenderness   Straight leg raising is Normal   Strength & tone: Normal   Stability overall normal stability     The patient has been educated about the nature of the problem(s) and counseled on treatment options.  The patient appeared to understand what I have discussed and is in agreement with it.  Encounter Diagnosis  Name Primary?  . Chronic left-sided low back pain with left-sided sciatica Yes    PLAN Call if any problems.  Precautions discussed.  Continue current medications.   Return to clinic 3 months   I have reviewed the [facility] web site prior to prescribing narcotic medicine for this patient.  Electronically Signed Sanjuana Kava, MD 5/8/20183:45 PM

## 2017-04-03 ENCOUNTER — Telehealth: Payer: Self-pay | Admitting: Orthopaedic Surgery

## 2017-04-03 MED ORDER — HYDROCODONE-ACETAMINOPHEN 7.5-325 MG PO TABS: 1.0000 | ORAL_TABLET | Freq: Four times a day (QID) | ORAL | 0 refills | Status: DC | PRN

## 2017-04-03 NOTE — Telephone Encounter (Signed)
Patient called for refill: (discussed MyChart access, which patient has been unable to activate; re-advised) HYDROcodone-acetaminophen (NORCO) 7.5-325 MG tablet 50 tablet

## 2017-05-07 ENCOUNTER — Telehealth: Payer: Self-pay | Admitting: Orthopaedic Surgery

## 2017-05-07 MED ORDER — HYDROCODONE-ACETAMINOPHEN 7.5-325 MG PO TABS: 1.0000 | ORAL_TABLET | Freq: Four times a day (QID) | ORAL | 0 refills | Status: DC | PRN

## 2017-05-07 NOTE — Telephone Encounter (Signed)
Patient called (states still unable to get into MyChart) - requests refill:  HYDROcodone-acetaminophen (NORCO) 7.5-325 MG tablet 45 tablet

## 2017-06-04 ENCOUNTER — Ambulatory Visit (INDEPENDENT_AMBULATORY_CARE_PROVIDER_SITE_OTHER): Payer: BLUE CROSS/BLUE SHIELD | Admitting: Orthopaedic Surgery

## 2017-06-04 ENCOUNTER — Encounter: Payer: Self-pay | Admitting: Orthopaedic Surgery

## 2017-06-04 VITALS — BP 141/79 | HR 78 | Temp 98.0°F | Ht 64.0 in | Wt 169.0 lb

## 2017-06-04 DIAGNOSIS — M5442 Lumbago with sciatica, left side: Secondary | ICD-10-CM

## 2017-06-04 DIAGNOSIS — G8929 Other chronic pain: Secondary | ICD-10-CM

## 2017-06-04 DIAGNOSIS — F17210 Nicotine dependence, cigarettes, uncomplicated: Secondary | ICD-10-CM | POA: Diagnosis not present

## 2017-06-04 MED ORDER — HYDROCODONE-ACETAMINOPHEN 7.5-325 MG PO TABS: 1.0000 | ORAL_TABLET | Freq: Four times a day (QID) | ORAL | 0 refills | Status: DC | PRN

## 2017-06-04 NOTE — Progress Notes (Signed)
Patient Judith Rodriguez, female DOB:Jan 03, 1966, 51 y.o. [id number]  Chief Complaint  Patient presents with  . Follow-up    Low back pain    HPI  Judith Rodriguez is a 51 y.o. female who has chronic lower back pain. She had epidural about three weeks ago.  It worked about two days and her pain is back with the left sided sciatica. She has an appointment with them next week.  She is taking her medicine and doing her exercises.    She continues to smoke but has cut way back. HPI  Body mass index is 29.01 kg/m.  ROS  Review of Systems  Constitutional:       Patient does not have Diabetes Mellitus. Patient does not have hypertension. Patient does not have COPD or shortness of breath. Patient does not have BMI > 35. Patient has current smoking history.  HENT: Negative for congestion.   Respiratory: Negative for cough and shortness of breath.   Cardiovascular: Negative for chest pain and leg swelling.  Endocrine: Negative for cold intolerance.  Musculoskeletal: Positive for arthralgias and back pain.  Allergic/Immunologic: Positive for environmental allergies.    Past Medical History:  Diagnosis Date  . Arthritis   . Bulging lumbar disc    pushing on sciatic nerve L4  . Carpal tunnel syndrome   . Depression 12/01/2015  . Dyslipidemia 12/05/2015   Will try diet and exercise first  . Hot flashes 12/01/2015  . Vitamin D deficiency 12/05/2015   Take vitamin D3 5000 iu daily    Past Surgical History:  Procedure Laterality Date  . ABDOMINAL HYSTERECTOMY    . CESAREAN SECTION    . SALPINGOOPHORECTOMY Left     Family History  Problem Relation Age of Onset  . Cancer Mother        lung  . Cancer Father        lung  . Hypertension Father   . Hypertension Brother   . Aneurysm Paternal Grandmother        brain  . Heart disease Paternal Grandfather        heart attack  . Hypertension Sister     Social History Social History  Substance Use Topics  . Smoking status: Current Every  Day Smoker    Packs/day: 1.00    Years: 33.00    Types: Cigarettes  . Smokeless tobacco: Never Used  . Alcohol use No    Allergies  Allergen Reactions  . Aspirin Hives    Current Outpatient Prescriptions  Medication Sig Dispense Refill  . Cholecalciferol (VITAMIN D-3 PO) Take by mouth daily.     . citalopram (CELEXA) 20 MG tablet Take 1 tablet (20 mg total) by mouth daily. 30 tablet 6  . conjugated estrogens (PREMARIN) vaginal cream Use .5 gm in vagina at hs x 2 weeks then 2-3 x weekly 72 g 0  . diphenhydrAMINE (BENADRYL) 25 mg capsule Take 25 mg by mouth daily.     Marland Kitchen HYDROcodone-acetaminophen (NORCO) 7.5-325 MG tablet Take 1 tablet by mouth every 6 (six) hours as needed for moderate pain (Must last 30 days.Do not drive or operate machinery while taking this medicine.). 45 tablet 0  . Menthol, Topical Analgesic, (BENGAY EX) Apply 1 application topically daily as needed (muscle pain).     No current facility-administered medications for this visit.      Physical Exam  Blood pressure (!) 141/79, pulse 78, temperature 98 F (36.7 C), height 5\' 4"  (1.626 m), weight 169 lb (76.7 kg).  Constitutional: overall normal hygiene, normal nutrition, well developed, normal grooming, normal body habitus. Assistive device:none  Musculoskeletal: gait and station Limp none, muscle tone and strength are normal, no tremors or atrophy is present.  .  Neurological: coordination overall normal.  Deep tendon reflex/nerve stretch intact.  Sensation normal.  Cranial nerves II-XII intact.   Skin:   Normal overall no scars, lesions, ulcers or rashes. No psoriasis.  Psychiatric: Alert and oriented x 3.  Recent memory intact, remote memory unclear.  Normal mood and affect. Well groomed.  Good eye contact.  Cardiovascular: overall no swelling, no varicosities, no edema bilaterally, normal temperatures of the legs and arms, no clubbing, cyanosis and good capillary refill.  Lymphatic: palpation is  normal.  Spine/Pelvis examination:  Inspection:  Overall, sacoiliac joint benign and hips nontender; without crepitus or defects.   Thoracic spine inspection: Alignment normal without kyphosis present   Lumbar spine inspection:  Alignment  with normal lumbar lordosis, without scoliosis apparent.   Thoracic spine palpation:  without tenderness of spinal processes   Lumbar spine palpation: with tenderness of lumbar area; without tightness of lumbar muscles    Range of Motion:   Lumbar flexion, forward flexion is 25 with pain or tenderness    Lumbar extension is 5 with pain or tenderness   Left lateral bend is Normal  without pain or tenderness   Right lateral bend is Normal without pain or tenderness   Straight leg raising is Normal   Strength & tone: Normal   Stability overall normal stability     The patient has been educated about the nature of the problem(s) and counseled on treatment options.  The patient appeared to understand what I have discussed and is in agreement with it.  Encounter Diagnoses  Name Primary?  . Chronic left-sided low back pain with left-sided sciatica Yes  . Cigarette nicotine dependence without complication     PLAN Call if any problems.  Precautions discussed.  Continue current medications.   Return to clinic 3 months   I have reviewed the [facility] web site prior to prescribing narcotic medicine for this patient.   Electronically Signed Sanjuana Kava, MD 8/7/20189:17 AM

## 2017-06-04 NOTE — Patient Instructions (Signed)
Steps to Quit Smoking Smoking tobacco can be bad for your health. It can also affect almost every organ in your body. Smoking puts you and people around you at risk for many serious [place]na [place]-lasting (chronic) diseases. Quitting smoking is hard, but it is one of the best things that you can do for your health. It is never too late to quit. What are the benefits of quitting smoking? When you quit smoking, you lower your risk for getting serious diseases and conditions. They can include:  Lung cancer or lung disease.  Heart disease.  Stroke.  Heart attack.  Not being able to have children (infertility).  Weak bones (osteoporosis) and broken bones (fractures).  If you have coughing, wheezing, and shortness of breath, those symptoms may get better when you quit. You may also get sick less often. If you are pregnant, quitting smoking can help to lower your chances of having a baby of low birth weight. What can I do to help me quit smoking? Talk with your doctor about what can help you quit smoking. Some things you can do (strategies) include:  Quitting smoking totally, instead of slowly cutting back how much you smoke over a period of time.  Going to in-person counseling. You are more likely to quit if you go to many counseling sessions.  Using resources and support systems, such as: ? Online chats with a counselor. ? Phone quitlines. ? Printed self-help materials. ? Support groups or group counseling. ? Text messaging programs. ? Mobile phone apps or applications.  Taking medicines. Some of these medicines may have nicotine in them. If you are pregnant or breastfeeding, do not take any medicines to quit smoking unless your doctor says it is okay. Talk with your doctor about counseling or other things that can help you.  Talk with your doctor about using more than one strategy at the same time, such as taking medicines while you are also going to in-person counseling. This can help make  quitting easier. What things can I do to make it easier to quit? Quitting smoking might feel very hard at first, but there is a lot that you can do to make it easier. Take these steps:  Talk to your family and friends. Ask them to support and encourage you.  Call phone quitlines, reach out to support groups, or work with a counselor.  Ask people who smoke to not smoke around you.  Avoid places that make you want (trigger) to smoke, such as: ? Bars. ? Parties. ? Smoke-break areas at work.  Spend time with people who do not smoke.  Lower the stress in your life. Stress can make you want to smoke. Try these things to help your stress: ? Getting regular exercise. ? Deep-breathing exercises. ? Yoga. ? Meditating. ? Doing a body scan. To do this, close your eyes, focus on one area of your body at a time from head to toe, and notice which parts of your body are tense. Try to relax the muscles in those areas.  Download or buy apps on your mobile phone or tablet that can help you stick to your quit plan. There are many free apps, such as QuitGuide from the CDC (Centers for Disease Control and Prevention). You can find more support from smokefree.gov and other websites.  This information is not intended to replace advice given to you by your health care provider. Make sure you discuss any questions you have with your health care provider. Document Released: 08/11/2009 Document   Revised: 06/12/2016 Document Reviewed: 03/01/2015 Elsevier Interactive Patient Education  2018 Elsevier Inc.  

## 2017-06-17 ENCOUNTER — Ambulatory Visit (INDEPENDENT_AMBULATORY_CARE_PROVIDER_SITE_OTHER): Payer: BLUE CROSS/BLUE SHIELD | Admitting: Adult Health

## 2017-06-17 ENCOUNTER — Encounter: Payer: Self-pay | Admitting: Adult Health

## 2017-06-17 VITALS — BP 120/90 | HR 74 | Ht 64.0 in | Wt 172.0 lb

## 2017-06-17 DIAGNOSIS — Z1212 Encounter for screening for malignant neoplasm of rectum: Secondary | ICD-10-CM

## 2017-06-17 DIAGNOSIS — Z1211 Encounter for screening for malignant neoplasm of colon: Secondary | ICD-10-CM | POA: Diagnosis not present

## 2017-06-17 DIAGNOSIS — Z01419 Encounter for gynecological examination (general) (routine) without abnormal findings: Secondary | ICD-10-CM | POA: Diagnosis not present

## 2017-06-17 DIAGNOSIS — E785 Hyperlipidemia, unspecified: Secondary | ICD-10-CM

## 2017-06-17 DIAGNOSIS — F32A Depression, unspecified: Secondary | ICD-10-CM

## 2017-06-17 DIAGNOSIS — F329 Major depressive disorder, single episode, unspecified: Secondary | ICD-10-CM

## 2017-06-17 DIAGNOSIS — K649 Unspecified hemorrhoids: Secondary | ICD-10-CM | POA: Insufficient documentation

## 2017-06-17 LAB — HEMOCCULT GUIAC POC 1CARD (OFFICE): Fecal Occult Blood, POC: NEGATIVE

## 2017-06-17 MED ORDER — CITALOPRAM HYDROBROMIDE 20 MG PO TABS: 20.0000 mg | ORAL_TABLET | Freq: Every day | ORAL | 12 refills | Status: DC

## 2017-06-17 MED ORDER — HYDROCORTISONE ACE-PRAMOXINE 1-1 % RE FOAM: 1.0000 | Freq: Two times a day (BID) | RECTAL | 3 refills | Status: DC

## 2017-06-17 NOTE — Progress Notes (Signed)
Patient ID: Judith Rodriguez, female   DOB: November 01, 1965, 51 y.o.   MRN: [id number] History of Present Illness:  Judith Rodriguez is a 51 year old white female, married, in for well woman gyn exam, she is sp hysterectomy.She is a Curator, self employed.   Current Medications, Allergies, Past Medical History, Past Surgical History, Family History and Social History were reviewed in [employer] record.     Review of Systems: Patient denies any headaches, hearing loss, fatigue, blurred vision, shortness of breath, chest pain, abdominal pain, problems with bowel movements, urination, or intercourse. No joint pain or mood swings.Celexa is working good, wants to continue current dose.  +hemorrhoids, +back pain    Physical Exam:BP 120/90 (BP Location: Right Arm, Patient Position: Sitting, Cuff Size: Normal)   Pulse 74   Ht 5\' 4"  (1.626 m)   Wt 172 lb (78 kg)   BMI 29.52 kg/m  General:  Well developed, well nourished, no acute distress Skin:  Warm and dry Neck:  Midline trachea, normal thyroid, good ROM, no lymphadenopathy Lungs; Clear to auscultation bilaterally Breast:  No dominant palpable mass, retraction, or nipple discharge Cardiovascular: Regular rate and rhythm Abdomen:  Soft, non tender, no hepatosplenomegaly Pelvic:  External genitalia is normal in appearance, no lesions.  The vagina is normal in appearance. Urethra has no lesions or masses. The cervix and uterus are absent. No adnexal masses or tenderness noted.Bladder is non tender, no masses felt. Rectal: Good sphincter tone, no polyps, + hemorrhoids felt.  Hemoccult negative. Extremities/musculoskeletal:  No swelling or varicosities noted, no clubbing or cyanosis Psych:  No mood changes, alert and cooperative,seems happy PHQ 2 score 0.  Impression: 1. Well woman exam with routine gynecological exam   2. Depression, unspecified depression type   3. Dyslipidemia   4. Hemorrhoids, unspecified hemorrhoid type   5. Screening  for colorectal cancer       Plan: Meds ordered this encounter  Medications  . hydrocortisone-pramoxine (PROCTOFOAM HC) rectal foam    Sig: Place 1 applicator rectally 2 (two) times daily.    Dispense:  10 g    Refill:  3    Order Specific Question:   Supervising Provider    Answer:   Elonda Husky, LUTHER H [[id number]]  . citalopram (CELEXA) 20 MG tablet    Sig: Take 1 tablet (20 mg total) by mouth daily.    Dispense:  30 tablet    Refill:  12    Order Specific Question:   Supervising Provider    Answer:   Tania Ade H [[id number]]  Check CMP and lipids Physical in 1 year Mammogram yearly Call when wants referral for colonoscopy

## 2017-06-17 NOTE — Patient Instructions (Signed)
Physical in 1 year mammogram yearly

## 2017-06-19 ENCOUNTER — Telehealth: Payer: Self-pay | Admitting: Adult Health

## 2017-06-19 NOTE — Telephone Encounter (Signed)
Left message x 1. JSY 

## 2017-06-21 MED ORDER — HYDROCORTISONE 2.5 % RE CREA: 1.0000 "application " | TOPICAL_CREAM | Freq: Two times a day (BID) | RECTAL | 0 refills | Status: DC

## 2017-06-21 NOTE — Telephone Encounter (Signed)
Will  rx anusol

## 2017-06-21 NOTE — Telephone Encounter (Signed)
Spoke with pt. Pt states Proctofoam is to expensive. Pt requests something cheaper. Pt is using Prep H but it's not strong enough. Please advise. Thanks!! [place]

## 2017-07-03 ENCOUNTER — Telehealth: Payer: Self-pay | Admitting: *Deleted

## 2017-07-03 MED ORDER — HYDROCODONE-ACETAMINOPHEN 7.5-325 MG PO TABS: 1.0000 | ORAL_TABLET | Freq: Four times a day (QID) | ORAL | 0 refills | Status: DC | PRN

## 2017-07-03 NOTE — Addendum Note (Signed)
Addended by: Willette Pa on: 07/03/2017 03:34 PM   Modules accepted: Orders

## 2017-07-03 NOTE — Telephone Encounter (Signed)
Patient called requesting hydrocodone to be refilled.

## 2017-08-05 ENCOUNTER — Telehealth: Payer: Self-pay | Admitting: Orthopaedic Surgery

## 2017-08-05 MED ORDER — HYDROCODONE-ACETAMINOPHEN 7.5-325 MG PO TABS: 1.0000 | ORAL_TABLET | Freq: Four times a day (QID) | ORAL | 0 refills | Status: DC | PRN

## 2017-08-05 NOTE — Telephone Encounter (Signed)
Hydrocodone-Acetaminophen 7.5/325mg  Qty 45 Tablets °

## 2017-08-29 ENCOUNTER — Telehealth: Payer: Self-pay | Admitting: Orthopaedic Surgery

## 2017-08-29 MED ORDER — HYDROCODONE-ACETAMINOPHEN 7.5-325 MG PO TABS: 1.0000 | ORAL_TABLET | Freq: Four times a day (QID) | ORAL | 0 refills | Status: DC | PRN

## 2017-08-29 NOTE — Telephone Encounter (Signed)
Hydrocodone-Acetaminophen 7.5/325mg  Qty 45 Tablets °

## 2017-09-04 ENCOUNTER — Encounter: Payer: Self-pay | Admitting: Orthopaedic Surgery

## 2017-09-04 ENCOUNTER — Ambulatory Visit: Payer: BLUE CROSS/BLUE SHIELD | Admitting: Orthopaedic Surgery

## 2017-09-10 ENCOUNTER — Ambulatory Visit: Payer: BLUE CROSS/BLUE SHIELD | Admitting: Orthopaedic Surgery

## 2017-09-11 ENCOUNTER — Encounter: Payer: Self-pay | Admitting: Orthopaedic Surgery

## 2017-09-11 ENCOUNTER — Ambulatory Visit: Payer: BLUE CROSS/BLUE SHIELD | Admitting: Orthopaedic Surgery

## 2017-09-24 ENCOUNTER — Other Ambulatory Visit: Payer: Self-pay | Admitting: Neurosurgery

## 2017-09-24 DIAGNOSIS — M5416 Radiculopathy, lumbar region: Secondary | ICD-10-CM

## 2017-09-30 ENCOUNTER — Ambulatory Visit
Admission: RE | Admit: 2017-09-30 | Discharge: 2017-09-30 | Disposition: A | Discharge status: Home / Self Care | Payer: BLUE CROSS/BLUE SHIELD | Source: Ambulatory Visit | Attending: Neurosurgery | Admitting: Neurosurgery

## 2017-09-30 DIAGNOSIS — M5416 Radiculopathy, lumbar region: Secondary | ICD-10-CM

## 2017-10-02 ENCOUNTER — Encounter: Payer: Self-pay | Admitting: Orthopaedic Surgery

## 2017-10-02 ENCOUNTER — Ambulatory Visit (INDEPENDENT_AMBULATORY_CARE_PROVIDER_SITE_OTHER): Payer: BLUE CROSS/BLUE SHIELD | Admitting: Orthopaedic Surgery

## 2017-10-02 VITALS — BP 131/79 | HR 95 | Temp 97.0°F | Ht 64.0 in | Wt 176.0 lb

## 2017-10-02 DIAGNOSIS — G8929 Other chronic pain: Secondary | ICD-10-CM

## 2017-10-02 DIAGNOSIS — M5442 Lumbago with sciatica, left side: Secondary | ICD-10-CM

## 2017-10-02 DIAGNOSIS — F17210 Nicotine dependence, cigarettes, uncomplicated: Secondary | ICD-10-CM

## 2017-10-02 MED ORDER — HYDROCODONE-ACETAMINOPHEN 7.5-325 MG PO TABS: 1.0000 | ORAL_TABLET | Freq: Four times a day (QID) | ORAL | 0 refills | Status: DC | PRN

## 2017-10-02 NOTE — Patient Instructions (Signed)
Steps to Quit Smoking Smoking tobacco can be bad for your health. It can also affect almost every organ in your body. Smoking puts you and people around you at risk for many serious Dovie [place]usta-lasting (chronic) diseases. Quitting smoking is hard, but it is one of the best things that you can do for your health. It is never too late to quit. What are the benefits of quitting smoking? When you quit smoking, you lower your risk for getting serious diseases and conditions. They can include:  Lung cancer or lung disease.  Heart disease.  Stroke.  Heart attack.  Not being able to have children (infertility).  Weak bones (osteoporosis) and broken bones (fractures).  If you have coughing, wheezing, and shortness of breath, those symptoms may get better when you quit. You may also get sick less often. If you are pregnant, quitting smoking can help to lower your chances of having a baby of low birth weight. What can I do to help me quit smoking? Talk with your doctor about what can help you quit smoking. Some things you can do (strategies) include:  Quitting smoking totally, instead of slowly cutting back how much you smoke over a period of time.  Going to in-person counseling. You are more likely to quit if you go to many counseling sessions.  Using resources and support systems, such as: ? Online chats with a counselor. ? Phone quitlines. ? Printed self-help materials. ? Support groups or group counseling. ? Text messaging programs. ? Mobile phone apps or applications.  Taking medicines. Some of these medicines may have nicotine in them. If you are pregnant or breastfeeding, do not take any medicines to quit smoking unless your doctor says it is okay. Talk with your doctor about counseling or other things that can help you.  Talk with your doctor about using more than one strategy at the same time, such as taking medicines while you are also going to in-person counseling. This can help make  quitting easier. What things can I do to make it easier to quit? Quitting smoking might feel very hard at first, but there is a lot that you can do to make it easier. Take these steps:  Talk to your family and friends. Ask them to support and encourage you.  Call phone quitlines, reach out to support groups, or work with a counselor.  Ask people who smoke to not smoke around you.  Avoid places that make you want (trigger) to smoke, such as: ? Bars. ? Parties. ? Smoke-break areas at work.  Spend time with people who do not smoke.  Lower the stress in your life. Stress can make you want to smoke. Try these things to help your stress: ? Getting regular exercise. ? Deep-breathing exercises. ? Yoga. ? Meditating. ? Doing a body scan. To do this, close your eyes, focus on one area of your body at a time from head to toe, and notice which parts of your body are tense. Try to relax the muscles in those areas.  Download or buy apps on your mobile phone or tablet that can help you stick to your quit plan. There are many free apps, such as QuitGuide from the CDC (Centers for Disease Control and Prevention). You can find more support from smokefree.gov and other websites.  This information is not intended to replace advice given to you by your health care provider. Make sure you discuss any questions you have with your health care provider. Document Released: 08/11/2009 Document   Revised: 06/12/2016 Document Reviewed: 03/01/2015 Elsevier Interactive Patient Education  2018 Elsevier Inc.  

## 2017-10-02 NOTE — Progress Notes (Addendum)
Patient Judith Rodriguez, female DOB:12-Jul-1966, 51 y.o. DJM:[id number]  Chief Complaint  Patient presents with  . Follow-up    low back pain    HPI  Judith Rodriguez is a 51 y.o. female who has lower back pain that is worse.  She has left sided paresthesias.  She has more pain, and has seen Dr. Vertell Limber for this.  He got a MRI a few days ago.  She is to see him next week.  She has no new trauma, no weakness.  She is taking her medicine and doing her exercises but it does not help that much.  She smokes and is down to 10 cigarettes a day now.  She has been smoking 32 years.  She is willing to cut back further. HPI  Body mass index is 30.21 kg/m.  ROS  Review of Systems  Constitutional:       Patient does not have Diabetes Mellitus. Patient does not have hypertension. Patient does not have COPD or shortness of breath. Patient does not have BMI > 35. Patient has current smoking history.  HENT: Negative for congestion.   Respiratory: Negative for cough and shortness of breath.   Cardiovascular: Negative for chest pain and leg swelling.  Endocrine: Negative for cold intolerance.  Musculoskeletal: Positive for arthralgias and back pain.  Allergic/Immunologic: Positive for environmental allergies.  All other systems reviewed and are negative.   Past Medical History:  Diagnosis Date  . Arthritis   . Bulging lumbar disc    pushing on sciatic nerve L4  . Carpal tunnel syndrome   . Depression 12/01/2015  . Dyslipidemia 12/05/2015   Will try diet and exercise first  . Hot flashes 12/01/2015  . Vitamin D deficiency 12/05/2015   Take vitamin D3 5000 iu daily    Past Surgical History:  Procedure Laterality Date  . ABDOMINAL HYSTERECTOMY    . CESAREAN SECTION    . SALPINGOOPHORECTOMY Left     Family History  Problem Relation Age of Onset  . Cancer Mother        lung  . Cancer Father        lung  . Hypertension Father   . Hypertension Brother   . Aneurysm Paternal Grandmother        brain   . Heart disease Paternal Grandfather        heart attack  . Hypertension Sister     Social History Social History   Tobacco Use  . Smoking status: Current Every Day Smoker    Packs/day: 0.50    Years: 33.00    Pack years: 16.50    Types: Cigarettes  . Smokeless tobacco: Never Used  Substance Use Topics  . Alcohol use: No  . Drug use: No    Allergies  Allergen Reactions  . Aspirin Hives    Current Outpatient Medications  Medication Sig Dispense Refill  . Cholecalciferol (VITAMIN D-3 PO) Take by mouth daily.     . citalopram (CELEXA) 20 MG tablet Take 1 tablet (20 mg total) by mouth daily. 30 tablet 12  . diphenhydrAMINE (BENADRYL) 25 mg capsule Take 25 mg by mouth daily.     Marland Kitchen HYDROcodone-acetaminophen (NORCO) 7.5-325 MG tablet Take 1 tablet by mouth every 6 (six) hours as needed for moderate pain (Must last 30 days.Do not drive or operate machinery while taking this medicine.). 45 tablet 0  . hydrocortisone (ANUSOL-HC) 2.5 % rectal cream Place 1 application rectally 2 (two) times daily. 30 g 0  . Menthol, Topical  Analgesic, (BENGAY EX) Apply 1 application topically daily as needed (muscle pain).     No current facility-administered medications for this visit.      Physical Exam  Blood pressure 131/79, pulse 95, temperature (!) 97 F (36.1 C), height 5\' 4"  (1.626 m), weight 176 lb (79.8 kg).  Constitutional: overall normal hygiene, normal nutrition, well developed, normal grooming, normal body habitus. Assistive device:none  Musculoskeletal: gait and station Limp none, muscle tone and strength are normal, no tremors or atrophy is present.  .  Neurological: coordination overall normal.  Deep tendon reflex/nerve stretch intact.  Sensation normal.  Cranial nerves II-XII intact.   Skin:   Normal overall no scars, lesions, ulcers or rashes. No psoriasis.  Psychiatric: Alert and oriented x 3.  Recent memory intact, remote memory unclear.  Normal mood and affect. Well  groomed.  Good eye contact.  Cardiovascular: overall no swelling, no varicosities, no edema bilaterally, normal temperatures of the legs and arms, no clubbing, cyanosis and good capillary refill.  Lymphatic: palpation is normal.  All other systems reviewed and are negative   Spine/Pelvis examination:  Inspection:  Overall, sacoiliac joint benign and hips nontender; without crepitus or defects.   Thoracic spine inspection: Alignment normal without kyphosis present   Lumbar spine inspection:  Alignment  with normal lumbar lordosis, without scoliosis apparent.   Thoracic spine palpation:  with tenderness of spinal processes   Lumbar spine palpation: with tenderness of lumbar area; without tightness of lumbar muscles    Range of Motion:   Lumbar flexion, forward flexion is 35 without pain or tenderness    Lumbar extension is 5 without pain or tenderness   Left lateral bend is Normal  without pain or tenderness   Right lateral bend is Normal without pain or tenderness   Straight leg raising is Abnormal- 30 on the left   Strength & tone: Normal   Stability overall normal stability    The patient has been educated about the nature of the problem(s) and counseled on treatment options.  The patient appeared to understand what I have discussed and is in agreement with it.  Encounter Diagnoses  Name Primary?  . Chronic left-sided low back pain with left-sided sciatica Yes  . Cigarette nicotine dependence without complication     PLAN Call if any problems.  Precautions discussed.  Continue current medications.   Return to clinic 2 months  The patient was counseled about smoking and smoking cessation.  I spent about three to five minutes in doing this.  I, of course, determined the patient does smoke and frequency.  I have advised the patient of problems medically that can occur with continued smoking including poor wound and bone healing as well as the obvious respiratory  problems.  I have assessed the patient's willingness to cut back and quit smoking.  The patient has stated she is willing to cut back more.  I have told the patient to discuss with their family doctor also about smoking cessation.  I am willing to assist my patient in arranging this and to get additional help.I have suggested the patient have a set date to try to begin cutting back.  I have printed out a smoking cessation form about how to begin cutting back and suggestions.  I will discuss this further when I see the patient in the future.  I have stressed that although this will be very difficult to accomplish, the benefits are very substantial and will give long term health benefits  from now on.   I have reviewed the [place] web site prior to prescribing narcotic medicine for this patient.  Electronically Signed Sanjuana Kava, MD 12/5/201810:17 AM

## 2017-10-03 ENCOUNTER — Ambulatory Visit: Payer: BLUE CROSS/BLUE SHIELD | Admitting: Orthopaedic Surgery

## 2017-10-31 ENCOUNTER — Telehealth: Payer: Self-pay | Admitting: Orthopaedic Surgery

## 2017-10-31 MED ORDER — HYDROCODONE-ACETAMINOPHEN 7.5-325 MG PO TABS: 1.0000 | ORAL_TABLET | Freq: Four times a day (QID) | ORAL | 0 refills | Status: DC | PRN

## 2017-10-31 NOTE — Telephone Encounter (Signed)
Patient called for refill:  HYDROcodone-acetaminophen (NORCO) 7.5-325 MG tablet 45 tablet  -pharmacy is [facility], [facility]

## 2017-12-03 ENCOUNTER — Ambulatory Visit: Payer: BLUE CROSS/BLUE SHIELD | Admitting: Orthopaedic Surgery

## 2017-12-03 ENCOUNTER — Encounter: Payer: Self-pay | Admitting: Orthopaedic Surgery

## 2017-12-03 VITALS — BP 142/79 | HR 92 | Temp 97.8°F | Ht 64.0 in | Wt 174.0 lb

## 2017-12-03 DIAGNOSIS — M5442 Lumbago with sciatica, left side: Secondary | ICD-10-CM | POA: Diagnosis not present

## 2017-12-03 DIAGNOSIS — G8929 Other chronic pain: Secondary | ICD-10-CM | POA: Diagnosis not present

## 2017-12-03 DIAGNOSIS — F17210 Nicotine dependence, cigarettes, uncomplicated: Secondary | ICD-10-CM | POA: Diagnosis not present

## 2017-12-03 MED ORDER — HYDROCODONE-ACETAMINOPHEN 7.5-325 MG PO TABS: 1.0000 | ORAL_TABLET | Freq: Four times a day (QID) | ORAL | 0 refills | Status: DC | PRN

## 2017-12-03 NOTE — Patient Instructions (Signed)

## 2017-12-03 NOTE — Progress Notes (Signed)
Patient Judith Rodriguez, female DOB:August 24, 1966, 52 y.o. FGH:[id number]  Chief Complaint  Patient presents with  . Back Pain    lumbar    HPI  Judith Rodriguez is a 52 y.o. female who has chronic lower back pain.  She had an epidural injection yesterday.  She got some relieve.  She has left sided sciatica that has gotten worse over the last few months.  She has no new trauma, no weakness. HPI  Body mass index is 29.87 kg/m.  ROS  Review of Systems  Constitutional:       Patient does not have Diabetes Mellitus. Patient does not have hypertension. Patient does not have COPD or shortness of breath. Patient does not have BMI > 35. Patient has current smoking history.  HENT: Negative for congestion.   Respiratory: Negative for cough and shortness of breath.   Cardiovascular: Negative for chest pain and leg swelling.  Endocrine: Negative for cold intolerance.  Musculoskeletal: Positive for arthralgias and back pain.  Allergic/Immunologic: Positive for environmental allergies.  All other systems reviewed and are negative.   Past Medical History:  Diagnosis Date  . Arthritis   . Bulging lumbar disc    pushing on sciatic nerve L4  . Carpal tunnel syndrome   . Depression 12/01/2015  . Dyslipidemia 12/05/2015   Will try diet and exercise first  . Hot flashes 12/01/2015  . Vitamin D deficiency 12/05/2015   Take vitamin D3 5000 iu daily    Past Surgical History:  Procedure Laterality Date  . ABDOMINAL HYSTERECTOMY    . CESAREAN SECTION    . SALPINGOOPHORECTOMY Left     Family History  Problem Relation Age of Onset  . Cancer Mother        lung  . Cancer Father        lung  . Hypertension Father   . Hypertension Brother   . Aneurysm Paternal Grandmother        brain  . Heart disease Paternal Grandfather        heart attack  . Hypertension Sister     Social History Social History   Tobacco Use  . Smoking status: Current Every Day Smoker    Packs/day: 0.50    Years: 33.00   Pack years: 16.50    Types: Cigarettes  . Smokeless tobacco: Never Used  Substance Use Topics  . Alcohol use: No  . Drug use: No    Allergies  Allergen Reactions  . Aspirin Hives    Current Outpatient Medications  Medication Sig Dispense Refill  . Cholecalciferol (VITAMIN D-3 PO) Take by mouth daily.     . citalopram (CELEXA) 20 MG tablet Take 1 tablet (20 mg total) by mouth daily. 30 tablet 12  . diphenhydrAMINE (BENADRYL) 25 mg capsule Take 25 mg by mouth daily.     Marland Kitchen HYDROcodone-acetaminophen (NORCO) 7.5-325 MG tablet Take 1 tablet by mouth every 6 (six) hours as needed for moderate pain (Must last 30 days.Do not drive or operate machinery while taking this medicine.). 45 tablet 0  . hydrocortisone (ANUSOL-HC) 2.5 % rectal cream Place 1 application rectally 2 (two) times daily. 30 g 0  . Menthol, Topical Analgesic, (BENGAY EX) Apply 1 application topically daily as needed (muscle pain).     No current facility-administered medications for this visit.      Physical Exam  Blood pressure (!) 142/79, pulse 92, temperature 97.8 F (36.6 C), height 5\' 4"  (1.626 m), weight 174 lb (78.9 kg).  Constitutional: overall normal hygiene,  normal nutrition, well developed, normal grooming, normal body habitus. Assistive device:none  Musculoskeletal: gait and station Limp none, muscle tone and strength are normal, no tremors or atrophy is present.  .  Neurological: coordination overall normal.  Deep tendon reflex/nerve stretch intact.  Sensation normal.  Cranial nerves II-XII intact.   Skin:   Normal overall no scars, lesions, ulcers or rashes. No psoriasis.  Psychiatric: Alert and oriented x 3.  Recent memory intact, remote memory unclear.  Normal mood and affect. Well groomed.  Good eye contact.  Cardiovascular: overall no swelling, no varicosities, no edema bilaterally, normal temperatures of the legs and arms, no clubbing, cyanosis and good capillary refill.  Lymphatic: palpation  is normal.  Spine/Pelvis examination:  Inspection:  Overall, sacoiliac joint benign and hips nontender; without crepitus or defects.   Thoracic spine inspection: Alignment normal without kyphosis present   Lumbar spine inspection:  Alignment  with normal lumbar lordosis, without scoliosis apparent.   Thoracic spine palpation:  without tenderness of spinal processes   Lumbar spine palpation: without tenderness of lumbar area; without tightness of lumbar muscles    Range of Motion:   Lumbar flexion, forward flexion is normal without pain or tenderness    Lumbar extension is full without pain or tenderness   Left lateral bend is normal without pain or tenderness   Right lateral bend is normal without pain or tenderness   Straight leg raising is normal  Strength & tone: normal   Stability overall normal stability All other systems reviewed and are negative   The patient has been educated about the nature of the problem(s) and counseled on treatment options.  The patient appeared to understand what I have discussed and is in agreement with it.  Encounter Diagnoses  Name Primary?  . Chronic left-sided low back pain with left-sided sciatica Yes  . Cigarette nicotine dependence without complication    She has cut back her smoking to four cigarettes a day.  PLAN Call if any problems.  Precautions discussed.  Continue current medications.   Return to clinic 3 months   I have reviewed the [place] web site prior to prescribing narcotic medicine for this patient.  Electronically Signed Sanjuana Kava, MD 2/5/20198:23 AM

## 2017-12-30 ENCOUNTER — Other Ambulatory Visit: Payer: Self-pay | Admitting: Orthopedic Surgery

## 2017-12-30 ENCOUNTER — Telehealth: Payer: Self-pay | Admitting: *Deleted

## 2017-12-30 DIAGNOSIS — Z1211 Encounter for screening for malignant neoplasm of colon: Secondary | ICD-10-CM

## 2017-12-30 DIAGNOSIS — Z1212 Encounter for screening for malignant neoplasm of rectum: Principal | ICD-10-CM

## 2017-12-30 NOTE — Telephone Encounter (Signed)
Patient of Dr. Brooke Bonito requests refill on Hydrocodone/Acetaminophen 7.5-325  Mgs.   Qty  45  Sig: Take 1 tablet by mouth every 6 (six) hours as needed for moderate pain (Must last 30 days.Do not drive or operate machinery while taking this medicine.).  Patient states she uses [facility] in [place]

## 2017-12-30 NOTE — Telephone Encounter (Signed)
Requests referral for colonoscopy, done to Dr Gala Romney

## 2017-12-31 MED ORDER — HYDROCODONE-ACETAMINOPHEN 7.5-325 MG PO TABS: 1.0000 | ORAL_TABLET | Freq: Four times a day (QID) | ORAL | 0 refills | Status: DC | PRN

## 2018-01-01 ENCOUNTER — Encounter: Payer: Self-pay | Admitting: Internal Medicine

## 2018-01-01 ENCOUNTER — Telehealth: Payer: Self-pay | Admitting: Radiology

## 2018-01-01 ENCOUNTER — Other Ambulatory Visit: Payer: Self-pay | Admitting: Orthopedic Surgery

## 2018-01-01 MED ORDER — HYDROCODONE-ACETAMINOPHEN 7.5-325 MG PO TABS: 1.0000 | ORAL_TABLET | Freq: Four times a day (QID) | ORAL | 0 refills | Status: DC | PRN

## 2018-01-01 NOTE — Telephone Encounter (Signed)
I redid it some of dr ks are in print mode :(

## 2018-01-01 NOTE — Telephone Encounter (Signed)
The rx from yesterday is showing printed, did you sign this or see it after it printed? I do not have it.

## 2018-01-30 ENCOUNTER — Other Ambulatory Visit: Payer: Self-pay | Admitting: Orthopaedic Surgery

## 2018-01-30 MED ORDER — HYDROCODONE-ACETAMINOPHEN 7.5-325 MG PO TABS: 1.0000 | ORAL_TABLET | Freq: Four times a day (QID) | ORAL | 0 refills | Status: DC | PRN

## 2018-01-30 NOTE — Telephone Encounter (Signed)
Patient of Dr. Brooke Bonito requests refill on Hydrocodone/Acetaminophen 7.5-325  Mgs.  Qty  45  Sig: Take 1 tablet by mouth every 6 (six) hours as needed for moderate pain.  Patient states she uses [facility] in [place]

## 2018-02-25 ENCOUNTER — Other Ambulatory Visit: Payer: Self-pay

## 2018-02-25 ENCOUNTER — Ambulatory Visit: Payer: BLUE CROSS/BLUE SHIELD | Admitting: Gastroenterology

## 2018-02-25 ENCOUNTER — Encounter: Payer: Self-pay | Admitting: Gastroenterology

## 2018-02-25 VITALS — BP 141/81 | HR 78 | Temp 98.4°F | Ht 64.5 in | Wt 174.8 lb

## 2018-02-25 DIAGNOSIS — Z1211 Encounter for screening for malignant neoplasm of colon: Secondary | ICD-10-CM | POA: Diagnosis not present

## 2018-02-25 DIAGNOSIS — K649 Unspecified hemorrhoids: Secondary | ICD-10-CM | POA: Diagnosis not present

## 2018-02-25 MED ORDER — PEG 3350-KCL-NA BICARB-NACL 420 G PO SOLR: 4000.0000 mL | ORAL | 0 refills | Status: DC

## 2018-02-25 NOTE — Patient Instructions (Signed)
1. Colonoscopy as scheduled. See separate instructions.  

## 2018-02-25 NOTE — H&P (View-Only) (Signed)
Primary Care Physician:  Rory Percy, MD  Primary Gastroenterologist:  Garfield Cornea, MD   Chief Complaint  Patient presents with  . Colonoscopy    consult    HPI:  Judith Rodriguez is a 52 y.o. female here at the request of Derrek Monaco, NP with Dr. Glo Herring for first ever screening colonoscopy.    Patient reports regular bowel movements.  No blood in the stool or melena.  Denies abdominal pain.  Occasional heartburn related to certain foods.  Typically simply avoids the foods or may be take Tums with good relief.  No unintentional weight loss, dysphagia.  She reports history of hemorrhoids, frequently has to push them back in but they tend to prolapse back out.  She would like to be evaluated for possible [facility] hemorrhoid banding.  Paternal aunt with history of colon cancer before the age of 80.  Patient's mother had a colonoscopy but did not have any polyps.  She died from lung cancer.  Father never had a colonoscopy.  Current Outpatient Medications  Medication Sig Dispense Refill  . Cholecalciferol (VITAMIN D-3 PO) Take by mouth daily.     . citalopram (CELEXA) 20 MG tablet Take 1 tablet (20 mg total) by mouth daily. 30 tablet 12  . diphenhydrAMINE (BENADRYL) 25 mg capsule Take 25 mg by mouth daily.     Marland Kitchen HYDROcodone-acetaminophen (NORCO) 7.5-325 MG tablet Take 1 tablet by mouth every 6 (six) hours as needed for moderate pain. 45 tablet 0  . hydrocortisone (ANUSOL-HC) 2.5 % rectal cream Place 1 application rectally 2 (two) times daily. (Patient taking differently: Place 1 application rectally as needed. ) 30 g 0  . Menthol, Topical Analgesic, (BENGAY EX) Apply 1 application topically daily as needed (muscle pain).     No current facility-administered medications for this visit.     Allergies as of 02/25/2018 - Review Complete 02/25/2018  Allergen Reaction Noted  . Aspirin Hives 10/20/2011    Past Medical History:  Diagnosis Date  . Arthritis   . Bulging lumbar disc    pushing  on sciatic nerve L4  . Carpal tunnel syndrome   . Depression 12/01/2015  . Dyslipidemia 12/05/2015   Will try diet and exercise first  . Hot flashes 12/01/2015  . Vitamin D deficiency 12/05/2015   Take vitamin D3 5000 iu daily    Past Surgical History:  Procedure Laterality Date  . ABDOMINAL HYSTERECTOMY    . CESAREAN SECTION    . SALPINGOOPHORECTOMY Left     Family History  Problem Relation Age of Onset  . Cancer Mother        lung  . Cancer Father        lung  . Hypertension Father   . Hypertension Brother   . Aneurysm Paternal Grandmother        brain  . Heart disease Paternal Grandfather        heart attack  . Hypertension Sister   . Colon cancer Paternal Aunt        less than age 82    Social History   Socioeconomic History  . Marital status: Married    Spouse name: Not on file  . Number of children: Not on file  . Years of education: Not on file  . Highest education level: Not on file  Occupational History  . Not on file  Social Needs  . Financial resource strain: Not on file  . Food insecurity:    Worry: Not on file  Inability: Not on file  . Transportation needs:    Medical: Not on file    Non-medical: Not on file  Tobacco Use  . Smoking status: Current Every Day Smoker    Packs/day: 0.50    Years: 33.00    Pack years: 16.50    Types: Cigarettes  . Smokeless tobacco: Never Used  Substance and Sexual Activity  . Alcohol use: No  . Drug use: No  . Sexual activity: Not Currently    Birth control/protection: Surgical    Comment: hyst  Lifestyle  . Physical activity:    Days per week: Not on file    Minutes per session: Not on file  . Stress: Not on file  Relationships  . Social connections:    Talks on phone: Not on file    Gets together: Not on file    Attends religious service: Not on file    Active member of club or organization: Not on file    Attends meetings of clubs or organizations: Not on file    Relationship status: Not on file  .  Intimate partner violence:    Fear of current or ex partner: Not on file    Emotionally abused: Not on file    Physically abused: Not on file    Forced sexual activity: Not on file  Other Topics Concern  . Not on file  Social History Narrative  . Not on file      ROS:  General: Negative for anorexia, weight loss, fever, chills, fatigue, weakness. Eyes: Negative for vision changes.  ENT: Negative for hoarseness, difficulty swallowing , nasal congestion. CV: Negative for chest pain, angina, palpitations, dyspnea on exertion, peripheral edema.  Respiratory: Negative for dyspnea at rest, dyspnea on exertion, cough, sputum, wheezing.  GI: See history of present illness. GU:  Negative for dysuria, hematuria, urinary incontinence, urinary frequency, nocturnal urination.  MS: Chronic joint and back pain Derm: Negative for rash or itching.  Neuro: Negative for weakness, abnormal sensation, seizure, frequent headaches, memory loss, confusion.  Psych: Negative for anxiety, depression, suicidal ideation, hallucinations.  Endo: Negative for unusual weight change.  Heme: Negative for bruising or bleeding. Allergy: Negative for rash or hives.    Physical Examination:  BP (!) 141/81   Pulse 78   Temp 98.4 F (36.9 C) (Oral)   Ht 5' 4.5" (1.638 m)   Wt 174 lb 12.8 oz (79.3 kg)   BMI 29.54 kg/m    General: Well-nourished, well-developed in no acute distress.  Head: Normocephalic, atraumatic.   Eyes: Conjunctiva pink, no icterus. Mouth: Oropharyngeal mucosa moist and pink , no lesions erythema or exudate. Neck: Supple without thyromegaly, masses, or lymphadenopathy.  Lungs: Clear to auscultation bilaterally.  Heart: Regular rate and rhythm, no murmurs rubs or gallops.  Abdomen: Bowel sounds are normal, nontender, nondistended, no hepatosplenomegaly or masses, no abdominal bruits or    hernia , no rebound or guarding.   Rectal: Deferred Extremities: No lower extremity edema. No clubbing  or deformities.  Neuro: Alert and oriented x 4 , grossly normal neurologically.  Skin: Warm and dry, no rash or jaundice.   Psych: Alert and cooperative, normal mood and affect.  Labs: heme negative 05/2017

## 2018-02-25 NOTE — Assessment & Plan Note (Signed)
Patient would like to be evaluated for possible [facility] hemorrhoid banding.  She describes at least grade 3 hemorrhoid.  Plan for evaluation at time of colonoscopy.

## 2018-02-25 NOTE — Assessment & Plan Note (Signed)
First ever screening colonoscopy in the near future.  Given chronic pain medication use, plan for deep sedation.  I have discussed the risks, alternatives, benefits with regards to but not limited to the risk of reaction to medication, bleeding, infection, perforation and the patient is agreeable to proceed. Written consent to be obtained.

## 2018-02-25 NOTE — Progress Notes (Signed)
Primary Care Physician:  Rory Percy, MD  Primary Gastroenterologist:  Garfield Cornea, MD   Chief Complaint  Patient presents with  . Colonoscopy    consult    HPI:  Judith Rodriguez is a 52 y.o. female here at the request of Derrek Monaco, NP with Dr. Glo Herring for first ever screening colonoscopy.    Patient reports regular bowel movements.  No blood in the stool or melena.  Denies abdominal pain.  Occasional heartburn related to certain foods.  Typically simply avoids the foods or may be take Tums with good relief.  No unintentional weight loss, dysphagia.  She reports history of hemorrhoids, frequently has to push them back in but they tend to prolapse back out.  She would like to be evaluated for possible [facility] hemorrhoid banding.  Paternal aunt with history of colon cancer before the age of 56.  Patient's mother had a colonoscopy but did not have any polyps.  She died from lung cancer.  Father never had a colonoscopy.  Current Outpatient Medications  Medication Sig Dispense Refill  . Cholecalciferol (VITAMIN D-3 PO) Take by mouth daily.     . citalopram (CELEXA) 20 MG tablet Take 1 tablet (20 mg total) by mouth daily. 30 tablet 12  . diphenhydrAMINE (BENADRYL) 25 mg capsule Take 25 mg by mouth daily.     Marland Kitchen HYDROcodone-acetaminophen (NORCO) 7.5-325 MG tablet Take 1 tablet by mouth every 6 (six) hours as needed for moderate pain. 45 tablet 0  . hydrocortisone (ANUSOL-HC) 2.5 % rectal cream Place 1 application rectally 2 (two) times daily. (Patient taking differently: Place 1 application rectally as needed. ) 30 g 0  . Menthol, Topical Analgesic, (BENGAY EX) Apply 1 application topically daily as needed (muscle pain).     No current facility-administered medications for this visit.     Allergies as of 02/25/2018 - Review Complete 02/25/2018  Allergen Reaction Noted  . Aspirin Hives 10/20/2011    Past Medical History:  Diagnosis Date  . Arthritis   . Bulging lumbar disc    pushing  on sciatic nerve L4  . Carpal tunnel syndrome   . Depression 12/01/2015  . Dyslipidemia 12/05/2015   Will try diet and exercise first  . Hot flashes 12/01/2015  . Vitamin D deficiency 12/05/2015   Take vitamin D3 5000 iu daily    Past Surgical History:  Procedure Laterality Date  . ABDOMINAL HYSTERECTOMY    . CESAREAN SECTION    . SALPINGOOPHORECTOMY Left     Family History  Problem Relation Age of Onset  . Cancer Mother        lung  . Cancer Father        lung  . Hypertension Father   . Hypertension Brother   . Aneurysm Paternal Grandmother        brain  . Heart disease Paternal Grandfather        heart attack  . Hypertension Sister   . Colon cancer Paternal Aunt        less than age 89    Social History   Socioeconomic History  . Marital status: Married    Spouse name: Not on file  . Number of children: Not on file  . Years of education: Not on file  . Highest education level: Not on file  Occupational History  . Not on file  Social Needs  . Financial resource strain: Not on file  . Food insecurity:    Worry: Not on file  Inability: Not on file  . Transportation needs:    Medical: Not on file    Non-medical: Not on file  Tobacco Use  . Smoking status: Current Every Day Smoker    Packs/day: 0.50    Years: 33.00    Pack years: 16.50    Types: Cigarettes  . Smokeless tobacco: Never Used  Substance and Sexual Activity  . Alcohol use: No  . Drug use: No  . Sexual activity: Not Currently    Birth control/protection: Surgical    Comment: hyst  Lifestyle  . Physical activity:    Days per week: Not on file    Minutes per session: Not on file  . Stress: Not on file  Relationships  . Social connections:    Talks on phone: Not on file    Gets together: Not on file    Attends religious service: Not on file    Active member of club or organization: Not on file    Attends meetings of clubs or organizations: Not on file    Relationship status: Not on file  .  Intimate partner violence:    Fear of current or ex partner: Not on file    Emotionally abused: Not on file    Physically abused: Not on file    Forced sexual activity: Not on file  Other Topics Concern  . Not on file  Social History Narrative  . Not on file      ROS:  General: Negative for anorexia, weight loss, fever, chills, fatigue, weakness. Eyes: Negative for vision changes.  ENT: Negative for hoarseness, difficulty swallowing , nasal congestion. CV: Negative for chest pain, angina, palpitations, dyspnea on exertion, peripheral edema.  Respiratory: Negative for dyspnea at rest, dyspnea on exertion, cough, sputum, wheezing.  GI: See history of present illness. GU:  Negative for dysuria, hematuria, urinary incontinence, urinary frequency, nocturnal urination.  MS: Chronic joint and back pain Derm: Negative for rash or itching.  Neuro: Negative for weakness, abnormal sensation, seizure, frequent headaches, memory loss, confusion.  Psych: Negative for anxiety, depression, suicidal ideation, hallucinations.  Endo: Negative for unusual weight change.  Heme: Negative for bruising or bleeding. Allergy: Negative for rash or hives.    Physical Examination:  BP (!) 141/81   Pulse 78   Temp 98.4 F (36.9 C) (Oral)   Ht 5' 4.5" (1.638 m)   Wt 174 lb 12.8 oz (79.3 kg)   BMI 29.54 kg/m    General: Well-nourished, well-developed in no acute distress.  Head: Normocephalic, atraumatic.   Eyes: Conjunctiva pink, no icterus. Mouth: Oropharyngeal mucosa moist and pink , no lesions erythema or exudate. Neck: Supple without thyromegaly, masses, or lymphadenopathy.  Lungs: Clear to auscultation bilaterally.  Heart: Regular rate and rhythm, no murmurs rubs or gallops.  Abdomen: Bowel sounds are normal, nontender, nondistended, no hepatosplenomegaly or masses, no abdominal bruits or    hernia , no rebound or guarding.   Rectal: Deferred Extremities: No lower extremity edema. No clubbing  or deformities.  Neuro: Alert and oriented x 4 , grossly normal neurologically.  Skin: Warm and dry, no rash or jaundice.   Psych: Alert and cooperative, normal mood and affect.  Labs: heme negative 05/2017

## 2018-02-25 NOTE — Progress Notes (Signed)
cc'ed to pcp °

## 2018-02-27 NOTE — Patient Instructions (Signed)
Judith Rodriguez  02/27/2018     @PREFPERIOPPHARMACY @   Your procedure is scheduled on  03/06/2018 .  Report to [facility] at  1130  A.M.  Call this number if you have problems the morning of surgery:  [phone]   Remember:  Do not eat food or drink liquids after midnight.  Take these medicines the morning of surgery with A SIP OF WATER  Hydrocodone.   Do not wear jewelry, make-up or nail polish.  Do not wear lotions, powders, or perfumes, or deodorant.  Do not shave 48 hours prior to surgery.  Men may shave face and neck.  Do not bring valuables to the hospital.  [facility] is not responsible for any belongings or valuables.  Contacts, dentures or bridgework may not be worn into surgery.  Leave your suitcase in the car.  After surgery it may be brought to your room.  For patients admitted to the hospital, discharge time will be determined by your treatment team.  Patients discharged the day of surgery will not be allowed to drive home.   Name and phone number of your driver:   family Special instructions:  Follow the diet and prep instructions given to you by Dr Roseanne Kaufman office.  Please read over the following fact sheets that you were given. Anesthesia Post-op Instructions and Care and Recovery After Surgery       Colonoscopy, Adult A colonoscopy is an exam to look at the large intestine. It is done to check for problems, such as:  Lumps (tumors).  Growths (polyps).  Swelling (inflammation).  Bleeding.  What happens before the procedure? Eating and drinking Follow instructions from your doctor about eating and drinking. These instructions may include:  A few days before the procedure - follow a low-fiber diet. ? Avoid nuts. ? Avoid seeds. ? Avoid dried fruit. ? Avoid raw fruits. ? Avoid vegetables.  1-3 days before the procedure - follow a clear liquid diet. Avoid liquids that have red or purple dye. Drink only clear liquids, such as: ? Clear  broth or bouillon. ? Black coffee or tea. ? Clear juice. ? Clear soft drinks or sports drinks. ? Gelatin dessert. ? Popsicles.  On the day of the procedure - do not eat or drink anything during the 2 hours before the procedure.  Bowel prep If you were prescribed an oral bowel prep:  Take it as told by your doctor. Starting the day before your procedure, you will need to drink a lot of liquid. The liquid will cause you to poop (have bowel movements) until your poop is almost clear or light green.  If your skin or butt gets irritated from diarrhea, you may: ? Wipe the area with wipes that have medicine in them, such as adult wet wipes with aloe and vitamin E. ? Put something on your skin that soothes the area, such as petroleum jelly.  If you throw up (vomit) while drinking the bowel prep, take a break for up to 60 minutes. Then begin the bowel prep again. If you keep throwing up and you cannot take the bowel prep without throwing up, call your doctor.  General instructions  Ask your doctor about changing or stopping your normal medicines. This is important if you take diabetes medicines or blood thinners.  Plan to have someone take you home from the hospital or clinic. What happens during the procedure?  An IV tube may be put into  one of your veins.  You will be given medicine to help you relax (sedative).  To reduce your risk of infection: ? Your doctors will wash their hands. ? Your anal area will be washed with soap.  You will be asked to lie on your side with your knees bent.  Your doctor will get a long, thin, flexible tube ready. The tube will have a camera and a light on the end.  The tube will be put into your anus.  The tube will be gently put into your large intestine.  Air will be delivered into your large intestine to keep it open. You may feel some pressure or cramping.  The camera will be used to take photos.  A small tissue sample may be removed from your  body to be looked at under a microscope (biopsy). If any possible problems are found, the tissue will be sent to a lab for testing.  If small growths are found, your doctor may remove them and have them checked for cancer.  The tube that was put into your anus will be slowly removed. The procedure may vary among doctors and hospitals. What happens after the procedure?  Your doctor will check on you often until the medicines you were given have worn off.  Do not drive for 24 hours after the procedure.  You may have a small amount of blood in your poop.  You may pass gas.  You may have mild cramps or bloating in your belly (abdomen).  It is up to you to get the results of your procedure. Ask your doctor, or the department performing the procedure, when your results will be ready. This information is not intended to replace advice given to you by your health care provider. Make sure you discuss any questions you have with your health care provider. Document Released: 11/17/2010 Document Revised: 08/15/2016 Document Reviewed: 12/27/2015 Elsevier Interactive Patient Education  2017 [employer]  Colonoscopy, Adult, Care After This sheet gives you information about how to care for yourself after your procedure. Your health care provider may also give you more specific instructions. If you have problems or questions, contact your health care provider. What can I expect after the procedure? After the procedure, it is common to have:  A small amount of blood in your stool for 24 hours after the procedure.  Some gas.  Mild abdominal cramping or bloating.  Follow these instructions at home: General instructions   For the first 24 hours after the procedure: ? Do not drive or use machinery. ? Do not sign important documents. ? Do not drink alcohol. ? Do your regular daily activities at a slower pace than normal. ? Eat soft, easy-to-digest foods. ? Rest often.  Take over-the-counter  or prescription medicines only as told by your health care provider.  It is up to you to get the results of your procedure. Ask your health care provider, or the department performing the procedure, when your results will be ready. Relieving cramping and bloating  Try walking around when you have cramps or feel bloated.  Apply heat to your abdomen as told by your health care provider. Use a heat source that your health care provider recommends, such as a moist heat pack or a heating pad. ? Place a towel between your skin and the heat source. ? Leave the heat on for 20-30 minutes. ? Remove the heat if your skin turns bright red. This is especially important if you are unable to  feel pain, heat, or cold. You may have a greater risk of getting burned. Eating and drinking  Drink enough fluid to keep your urine clear or pale yellow.  Resume your normal diet as instructed by your health care provider. Avoid heavy or fried foods that are hard to digest.  Avoid drinking alcohol for as long as instructed by your health care provider. Contact a health care provider if:  You have blood in your stool 2-3 days after the procedure. Get help right away if:  You have more than a small spotting of blood in your stool.  You pass large blood clots in your stool.  Your abdomen is swollen.  You have nausea or vomiting.  You have a fever.  You have increasing abdominal pain that is not relieved with medicine. This information is not intended to replace advice given to you by your health care provider. Make sure you discuss any questions you have with your health care provider. Document Released: 05/29/2004 Document Revised: 07/09/2016 Document Reviewed: 12/27/2015 Elsevier Interactive Patient Education  2018 [place] Anesthesia is a term that refers to techniques, procedures, and medicines that help a person stay safe and comfortable during a medical procedure.  Monitored anesthesia care, or sedation, is one type of anesthesia. Your anesthesia specialist may recommend sedation if you will be having a procedure that does not require you to be unconscious, such as:  Cataract surgery.  A dental procedure.  A biopsy.  A colonoscopy.  During the procedure, you may receive a medicine to help you relax (sedative). There are three levels of sedation:  Mild sedation. At this level, you may feel awake and relaxed. You will be able to follow directions.  Moderate sedation. At this level, you will be sleepy. You may not remember the procedure.  Deep sedation. At this level, you will be asleep. You will not remember the procedure.  The more medicine you are given, the deeper your level of sedation will be. Depending on how you respond to the procedure, the anesthesia specialist may change your level of sedation or the type of anesthesia to fit your needs. An anesthesia specialist will monitor you closely during the procedure. Let your health care provider know about:  Any allergies you have.  All medicines you are taking, including vitamins, herbs, eye drops, creams, and over-the-counter medicines.  Any use of steroids (by mouth or as a cream).  Any problems you or family members have had with sedatives and anesthetic medicines.  Any blood disorders you have.  Any surgeries you have had.  Any medical conditions you have, such as sleep apnea.  Whether you are pregnant or may be pregnant.  Any use of cigarettes, alcohol, or street drugs. What are the risks? Generally, this is a safe procedure. However, problems may occur, including:  Getting too much medicine (oversedation).  Nausea.  Allergic reaction to medicines.  Trouble breathing. If this happens, a breathing tube may be used to help with breathing. It will be removed when you are awake and breathing on your own.  Heart trouble.  Lung trouble.  Before the procedure Staying  hydrated Follow instructions from your health care provider about hydration, which may include:  Up to 2 hours before the procedure - you may continue to drink clear liquids, such as water, clear fruit juice, black coffee, and plain tea.  Eating and drinking restrictions Follow instructions from your health care provider about eating and drinking, which may include:  8 hours before the procedure - stop eating heavy meals or foods such as meat, fried foods, or fatty foods.  6 hours before the procedure - stop eating light meals or foods, such as toast or cereal.  6 hours before the procedure - stop drinking milk or drinks that contain milk.  2 hours before the procedure - stop drinking clear liquids.  Medicines Ask your health care provider about:  Changing or stopping your regular medicines. This is especially important if you are taking diabetes medicines or blood thinners.  Taking medicines such as aspirin and ibuprofen. These medicines can thin your blood. Do not take these medicines before your procedure if your health care provider instructs you not to.  Tests and exams  You will have a physical exam.  You may have blood tests done to show: ? How well your kidneys and liver are working. ? How well your blood can clot.  General instructions  Plan to have someone take you home from the hospital or clinic.  If you will be going home right after the procedure, plan to have someone with you for 24 hours.  What happens during the procedure?  Your blood pressure, heart rate, breathing, level of pain and overall condition will be monitored.  An IV tube will be inserted into one of your veins.  Your anesthesia specialist will give you medicines as needed to keep you comfortable during the procedure. This may mean changing the level of sedation.  The procedure will be performed. After the procedure  Your blood pressure, heart rate, breathing rate, and blood oxygen level  will be monitored until the medicines you were given have worn off.  Do not drive for 24 hours if you received a sedative.  You may: ? Feel sleepy, clumsy, or nauseous. ? Feel forgetful about what happened after the procedure. ? Have a sore throat if you had a breathing tube during the procedure. ? Vomit. This information is not intended to replace advice given to you by your health care provider. Make sure you discuss any questions you have with your health care provider. Document Released: 07/11/2005 Document Revised: 03/23/2016 Document Reviewed: 02/05/2016 Elsevier Interactive Patient Education  2018 [place], Care After These instructions provide you with information about caring for yourself after your procedure. Your health care provider may also give you more specific instructions. Your treatment has been planned according to current medical practices, but problems sometimes occur. Call your health care provider if you have any problems or questions after your procedure. What can I expect after the procedure? After your procedure, it is common to:  Feel sleepy for several hours.  Feel clumsy and have poor balance for several hours.  Feel forgetful about what happened after the procedure.  Have poor judgment for several hours.  Feel nauseous or vomit.  Have a sore throat if you had a breathing tube during the procedure.  Follow these instructions at home: For at least 24 hours after the procedure:   Do not: ? Participate in activities in which you could fall or become injured. ? Drive. ? Use heavy machinery. ? Drink alcohol. ? Take sleeping pills or medicines that cause drowsiness. ? Make important decisions or sign legal documents. ? Take care of children on your own.  Rest. Eating and drinking  Follow the diet that is recommended by your health care provider.  If you vomit, drink water, juice, or soup when you can drink without  vomiting.  Make sure you have little or no nausea before eating solid foods. General instructions  Have a responsible adult stay with you until you are awake and alert.  Take over-the-counter and prescription medicines only as told by your health care provider.  If you smoke, do not smoke without supervision.  Keep all follow-up visits as told by your health care provider. This is important. Contact a health care provider if:  You keep feeling nauseous or you keep vomiting.  You feel light-headed.  You develop a rash.  You have a fever. Get help right away if:  You have trouble breathing. This information is not intended to replace advice given to you by your health care provider. Make sure you discuss any questions you have with your health care provider. Document Released: 02/05/2016 Document Revised: 06/06/2016 Document Reviewed: 02/05/2016 Elsevier Interactive Patient Education  [employer].

## 2018-02-28 ENCOUNTER — Other Ambulatory Visit: Payer: Self-pay | Admitting: Orthopaedic Surgery

## 2018-02-28 NOTE — Telephone Encounter (Addendum)
States she didn't get to call yesterday before lunch due to a family emergency (father-in-law possible heart attack).    Patient is aware it may be Monday before its ready.  Hydrocodone-Acetaminophen 7.5/325 mg  Qty 45 Tablets  Take 1 tablet by mouth every 6(six) hours as needed for moderate pain.  PATIENT USES [facility] IN [place]

## 2018-03-03 ENCOUNTER — Other Ambulatory Visit: Payer: Self-pay

## 2018-03-03 ENCOUNTER — Encounter (HOSPITAL_COMMUNITY)
Admission: RE | Admit: 2018-03-03 | Discharge: 2018-03-03 | Disposition: A | Discharge status: Home / Self Care | Payer: BLUE CROSS/BLUE SHIELD | Source: Ambulatory Visit | Attending: Internal Medicine | Admitting: Internal Medicine

## 2018-03-03 ENCOUNTER — Encounter (HOSPITAL_COMMUNITY): Payer: Self-pay

## 2018-03-03 DIAGNOSIS — Z01812 Encounter for preprocedural laboratory examination: Secondary | ICD-10-CM | POA: Insufficient documentation

## 2018-03-03 LAB — CBC
HEMATOCRIT: 43.5 % (ref 36.0–46.0)
HEMOGLOBIN: 13.7 g/dL (ref 12.0–15.0)
MCH: 31.9 pg (ref 26.0–34.0)
MCHC: 31.5 g/dL (ref 30.0–36.0)
MCV: 101.4 fL — ABNORMAL HIGH (ref 78.0–100.0)
Platelets: 297 10*3/uL (ref 150–400)
RBC: 4.29 MIL/uL (ref 3.87–5.11)
RDW: 13.6 % (ref 11.5–15.5)
WBC: 10.6 10*3/uL — AB (ref 4.0–10.5)

## 2018-03-03 LAB — BASIC METABOLIC PANEL
ANION GAP: 10 (ref 5–15)
BUN: 13 mg/dL (ref 6–20)
CHLORIDE: 104 mmol/L (ref 101–111)
CO2: 25 mmol/L (ref 22–32)
CREATININE: 0.9 mg/dL (ref 0.44–1.00)
Calcium: 9.2 mg/dL (ref 8.9–10.3)
GFR calc non Af Amer: >60 mL/min (ref >60)
Glucose, Bld: 119 mg/dL — ABNORMAL HIGH (ref 65–99)
POTASSIUM: 3.4 mmol/L — AB (ref 3.5–5.1)
SODIUM: 139 mmol/L (ref 135–145)

## 2018-03-03 MED ORDER — HYDROCODONE-ACETAMINOPHEN 7.5-325 MG PO TABS: 1.0000 | ORAL_TABLET | Freq: Four times a day (QID) | ORAL | 0 refills | Status: DC | PRN

## 2018-03-06 ENCOUNTER — Encounter (HOSPITAL_COMMUNITY)
Admission: RE | Disposition: A | Discharge status: Home / Self Care | Payer: Self-pay | Source: Ambulatory Visit | Attending: Internal Medicine

## 2018-03-06 ENCOUNTER — Ambulatory Visit (HOSPITAL_COMMUNITY): Payer: BLUE CROSS/BLUE SHIELD | Admitting: Anesthesiology

## 2018-03-06 ENCOUNTER — Encounter (HOSPITAL_COMMUNITY): Payer: Self-pay | Admitting: *Deleted

## 2018-03-06 ENCOUNTER — Ambulatory Visit (HOSPITAL_COMMUNITY)
Admission: RE | Admit: 2018-03-06 | Discharge: 2018-03-06 | Disposition: A | Discharge status: Home / Self Care | Payer: BLUE CROSS/BLUE SHIELD | Source: Ambulatory Visit | Attending: Internal Medicine | Admitting: Internal Medicine

## 2018-03-06 DIAGNOSIS — K648 Other hemorrhoids: Secondary | ICD-10-CM | POA: Diagnosis not present

## 2018-03-06 DIAGNOSIS — D123 Benign neoplasm of transverse colon: Secondary | ICD-10-CM | POA: Insufficient documentation

## 2018-03-06 DIAGNOSIS — Z79899 Other long term (current) drug therapy: Secondary | ICD-10-CM | POA: Diagnosis not present

## 2018-03-06 DIAGNOSIS — E785 Hyperlipidemia, unspecified: Secondary | ICD-10-CM | POA: Diagnosis not present

## 2018-03-06 DIAGNOSIS — Z800 Family history of malignant neoplasm of digestive organs: Secondary | ICD-10-CM | POA: Diagnosis not present

## 2018-03-06 DIAGNOSIS — Z1211 Encounter for screening for malignant neoplasm of colon: Secondary | ICD-10-CM | POA: Diagnosis not present

## 2018-03-06 DIAGNOSIS — F329 Major depressive disorder, single episode, unspecified: Secondary | ICD-10-CM | POA: Insufficient documentation

## 2018-03-06 DIAGNOSIS — Z7982 Long term (current) use of aspirin: Secondary | ICD-10-CM | POA: Diagnosis not present

## 2018-03-06 DIAGNOSIS — K5730 Diverticulosis of large intestine without perforation or abscess without bleeding: Secondary | ICD-10-CM | POA: Insufficient documentation

## 2018-03-06 DIAGNOSIS — F17210 Nicotine dependence, cigarettes, uncomplicated: Secondary | ICD-10-CM | POA: Diagnosis not present

## 2018-03-06 DIAGNOSIS — D120 Benign neoplasm of cecum: Secondary | ICD-10-CM | POA: Insufficient documentation

## 2018-03-06 DIAGNOSIS — E559 Vitamin D deficiency, unspecified: Secondary | ICD-10-CM | POA: Insufficient documentation

## 2018-03-06 HISTORY — PX: COLONOSCOPY WITH PROPOFOL: SHX5780

## 2018-03-06 HISTORY — PX: POLYPECTOMY: SHX5525

## 2018-03-06 SURGERY — COLONOSCOPY WITH PROPOFOL
Anesthesia: General

## 2018-03-06 MED ORDER — CHLORHEXIDINE GLUCONATE CLOTH 2 % EX PADS: 6.0000 | MEDICATED_PAD | Freq: Once | CUTANEOUS | Status: DC

## 2018-03-06 MED ORDER — LACTATED RINGERS IV SOLN
INTRAVENOUS | Status: DC
  Administered 2018-03-06: 1000 mL via INTRAVENOUS

## 2018-03-06 MED ORDER — FENTANYL CITRATE (PF) 100 MCG/2ML IJ SOLN
INTRAMUSCULAR | Status: DC | PRN
  Administered 2018-03-06: 25 ug via INTRAVENOUS

## 2018-03-06 MED ORDER — MIDAZOLAM HCL 5 MG/5ML IJ SOLN
INTRAMUSCULAR | Status: DC | PRN
  Administered 2018-03-06: 2 mg via INTRAVENOUS

## 2018-03-06 MED ORDER — PROPOFOL 500 MG/50ML IV EMUL
INTRAVENOUS | Status: DC | PRN
  Administered 2018-03-06: 15:00:00 via INTRAVENOUS
  Administered 2018-03-06: 225 ug/kg/min via INTRAVENOUS
  Administered 2018-03-06 (×2): via INTRAVENOUS

## 2018-03-06 MED ORDER — PROPOFOL 10 MG/ML IV BOLUS
INTRAVENOUS | Status: AC
  Filled 2018-03-06: qty 40

## 2018-03-06 NOTE — Anesthesia Postprocedure Evaluation (Signed)
Anesthesia Post Note  Patient: Judith Rodriguez  Procedure(s) Performed: COLONOSCOPY WITH PROPOFOL (N/A ) POLYPECTOMY  Patient location during evaluation: PACU Anesthesia Type: General Level of consciousness: awake and alert, oriented and patient cooperative Pain management: pain level controlled Vital Signs Assessment: post-procedure vital signs reviewed and stable Respiratory status: spontaneous breathing and respiratory function stable Cardiovascular status: stable Postop Assessment: no apparent nausea or vomiting Anesthetic complications: no     Last Vitals:  Vitals:   03/06/18 1117  BP: 129/83  Pulse: 75  Resp: 18  Temp: 36.8 C  SpO2: 98%    Last Pain:  Vitals:   03/06/18 1423  TempSrc:   PainSc: 0-No pain                 ADAMS, AMY A

## 2018-03-06 NOTE — Op Note (Signed)
[facility] Patient Name: Judith Rodriguez Procedure Date: 03/06/2018 1:51 PM MRN: [id number] Date of Birth: 1966/09/08 Attending MD: Norvel Richards , MD CSN: [id number] Age: 52 Admit Type: Outpatient Procedure:                Colonoscopy Indications:              Screening for colorectal malignant neoplasm Providers:                Norvel Richards, MD, Janeece Riggers, RN, Nelma Rothman, Technician Referring MD:              Medicines:                Propofol per Anesthesia Complications:            No immediate complications. Estimated Blood Loss:     Estimated blood loss was minimal. Procedure:                Pre-Anesthesia Assessment:                           - Prior to the procedure, a History and Physical                            was performed, and patient medications and                            allergies were reviewed. The patient's tolerance of                            previous anesthesia was also reviewed. The risks                            and benefits of the procedure and the sedation                            options and risks were discussed with the patient.                            All questions were answered, and informed consent                            was obtained. Prior Anticoagulants: The patient has                            taken no previous anticoagulant or antiplatelet                            agents. ASA Grade Assessment: II - A patient with                            mild systemic disease. After reviewing the risks  and benefits, the patient was deemed in                            satisfactory condition to undergo the procedure.                           After obtaining informed consent, the colonoscope                            was passed under direct vision. Throughout the                            procedure, the patient's blood pressure, pulse, and                            oxygen  saturations were monitored continuously. The                            EC-[id number] ([id number]) scope was introduced through                            the and advanced to the the cecum, identified by                            appendiceal orifice and ileocecal valve. The                            colonoscopy was performed without difficulty. The                            patient tolerated the procedure well. The ileocecal                            valve, appendiceal orifice, and rectum were                            photographed. The entire colon was well visualized.                            The entire colon was well visualized. Scope In: 2:21:15 PM Scope Out: 3:20:12 PM Scope Withdrawal Time: 0 hours 54 minutes 1 second  Total Procedure Duration: 0 hours 58 minutes 57 seconds  Findings:      The perianal and digital rectal examinations were normal.      Scattered medium-mouthed diverticula were found in the entire colon.      Two semi-pedunculated polyps were found in the cecum. The polyps were 5       to 6 mm in size. These polyps were removed with a cold snare. Resection       and retrieval were complete. Estimated blood loss was minimal.      A 5 mm polyp was found in the hepatic flexure. The polyp was sessile.       The polyp was removed with a cold snare. Resection and retrieval were       complete. Estimated blood loss was minimal.  A 30 mm polyp was found in the hepatic flexure. The polyp was sessile.       The polyp was removed with a hot snare. Resection and retrieval were       complete. Estimated blood loss was minimal. Elloview 7cc used to nicely       lift this polyp away from the colonic wall. piecemeal hot snare       polypectomy x 2 with complete removal      internal hemorrhoids were found during retroflexion.      The exam was otherwise without abnormality on direct and retroflexion       views. Impression:               - Diverticulosis in the entire examined  colon.                           - Two 5 to 6 mm polyps in the cecum, removed with a                            cold snare. Resected and retrieved.                           - One 5 mm polyp at the hepatic flexure, removed                            with a cold snare. Resected and retrieved.                           - One 30 mm polyp at the hepatic flexure, removed                            with a hot snare. Resected and retrieved. Piecemeal                           - Non-bleeding internal hemorrhoids.                           - The examination was otherwise normal on direct                            and retroflexion views. Moderate Sedation:      Moderate (conscious) sedation was personally administered by an       anesthesia professional. The following parameters were monitored: oxygen       saturation, heart rate, blood pressure, respiratory rate, EKG, adequacy       of pulmonary ventilation, and response to care. Total physician       intraservice time was 65 minutes. Recommendation:           - Patient has a contact number available for                            emergencies. The signs and symptoms of potential                            delayed complications were discussed with the  patient. Return to normal activities tomorrow.                            Written discharge instructions were provided to the                            patient.                           - Resume previous diet.                           - Continue present medications.                           - Repeat colonoscopy date to be determined after                            pending pathology results are reviewed for                            surveillance.                           - Return to GI office in 6 months. hemorrhoid                            banding pamphlets provided per patient request. Procedure Code(s):        --- Professional ---                            [phone], Colonoscopy, flexible; with removal of                            tumor(s), polyp(s), or other lesion(s) by snare                            technique Diagnosis Code(s):        --- Professional ---                           Z12.11, Encounter for screening for malignant                            neoplasm of colon                           K64.8, Other hemorrhoids                           D12.0, Benign neoplasm of cecum                           D12.3, Benign neoplasm of transverse colon (hepatic                            flexure or splenic flexure)  K57.30, Diverticulosis of large intestine without                            perforation or abscess without bleeding CPT copyright 2017 American Medical Association. All rights reserved. The codes documented in this report are preliminary and upon coder review may  be revised to meet current compliance requirements. Cristopher Estimable. Angela Vazguez, MD Norvel Richards, MD 03/06/2018 3:32:33 PM This report has been signed electronically. Number of Addenda: 0

## 2018-03-06 NOTE — Interval H&P Note (Signed)
History and Physical Interval Note:  03/06/2018 2:03 PM  Judith Rodriguez  has presented today for surgery, with the diagnosis of screening colonoscopy  The various methods of treatment have been discussed with the patient and family. After consideration of risks, benefits and other options for treatment, the patient has consented to  Procedure(s) with comments: COLONOSCOPY WITH PROPOFOL (N/A) - 1:30pm as a surgical intervention .  The patient's history has been reviewed, patient examined, no change in status, stable for surgery.  I have reviewed the patient's chart and labs.  Questions were answered to the patient's satisfaction.     Nakina Spatz  No change; TCS per plan.  The risks, benefits, limitations, alternatives and imponderables have been reviewed with the patient. Questions have been answered. All parties are agreeable.

## 2018-03-06 NOTE — Transfer of Care (Signed)
Immediate Anesthesia Transfer of Care Note  Patient: Judith Rodriguez  Procedure(s) Performed: COLONOSCOPY WITH PROPOFOL (N/A ) POLYPECTOMY  Patient Location: PACU  Anesthesia Type:General  Level of Consciousness: awake, alert , oriented and patient cooperative  Airway & Oxygen Therapy: Patient Spontanous Breathing  Post-op Assessment: Report given to RN and Post -op Vital signs reviewed and stable  Post vital signs: Reviewed and stable  Last Vitals:  Vitals Value Taken Time  BP    Temp    Pulse    Resp    SpO2      Last Pain:  Vitals:   03/06/18 1423  TempSrc:   PainSc: 0-No pain      Patients Stated Pain Goal: 8 (16/07/37 1062)  Complications: No apparent anesthesia complications

## 2018-03-06 NOTE — Anesthesia Procedure Notes (Signed)
Procedure Name: MAC Date/Time: 03/06/2018 2:14 PM Performed by: Andree Elk Kire Ferg A, CRNA Pre-anesthesia Checklist: Patient identified, Emergency Drugs available, Suction available, Patient being monitored and Timeout performed Oxygen Delivery Method: Simple face mask

## 2018-03-06 NOTE — Anesthesia Preprocedure Evaluation (Addendum)
Anesthesia Evaluation  Patient identified by MRN, date of birth, ID band Patient awake    Reviewed: Allergy & Precautions, NPO status , Patient's Chart, lab work & pertinent test results  Airway Mallampati: II  TM Distance: >3 FB Neck ROM: Full    Dental no notable dental hx. (+) Teeth Intact, Poor Dentition   Pulmonary neg pulmonary ROS, Current Smoker,    Pulmonary exam normal breath sounds clear to auscultation       Cardiovascular Exercise Tolerance: Good negative cardio ROS Normal cardiovascular examI Rhythm:Regular Rate:Normal     Neuro/Psych PSYCHIATRIC DISORDERS Depression  Neuromuscular disease negative neurological ROS  negative psych ROS   GI/Hepatic negative GI ROS, Neg liver ROS,   Endo/Other  negative endocrine ROS  Renal/GU negative Renal ROS  negative genitourinary   Musculoskeletal negative musculoskeletal ROS (+) Arthritis , Osteoarthritis,  Took norco today for LBP   Abdominal   Peds negative pediatric ROS (+)  Hematology negative hematology ROS (+)   Anesthesia Other Findings   Reproductive/Obstetrics negative OB ROS                             Anesthesia Physical Anesthesia Plan  ASA: II  Anesthesia Plan: General   Post-op Pain Management:    Induction: Intravenous  PONV Risk Score and Plan:   Airway Management Planned: Simple Face Mask and Nasal Cannula  Additional Equipment:   Intra-op Plan:   Post-operative Plan: Extubation in OR  Informed Consent: I have reviewed the patients History and Physical, chart, labs and discussed the procedure including the risks, benefits and alternatives for the proposed anesthesia with the patient or authorized representative who has indicated his/her understanding and acceptance.     Plan Discussed with: CRNA  Anesthesia Plan Comments:        Anesthesia Quick Evaluation

## 2018-03-06 NOTE — Discharge Instructions (Signed)
Colon polyp and diverticulosis information provided   No aspirin or arthritis medications like Aleve or Advil for the next 10 days   Further recommendations to follow pending review of pathology report   Colon Polyps Polyps are tissue growths inside the body. Polyps can grow in many places, including the large intestine (colon). A polyp may be a round bump or a mushroom-shaped growth. You could have one polyp or several. Most colon polyps are noncancerous (benign). However, some colon polyps can become cancerous over time. What are the causes? The exact cause of colon polyps is not known. What increases the risk? This condition is more likely to develop in people who:  Have a family history of colon cancer or colon polyps.  Are older than 67 or older than 45 if they are African American.  Have inflammatory bowel disease, such as ulcerative colitis or Crohn disease.  Are overweight.  Smoke cigarettes.  Do not get enough exercise.  Drink too much alcohol.  Eat a diet that is: ? High in fat and red meat. ? Low in fiber.  Had childhood cancer that was treated with abdominal radiation.  What are the signs or symptoms? Most polyps do not cause symptoms. If you have symptoms, they may include:  Blood coming from your rectum when having a bowel movement.  Blood in your stool.The stool may look dark red or black.  A change in bowel habits, such as constipation or diarrhea.  How is this diagnosed? This condition is diagnosed with a colonoscopy. This is a procedure that uses a lighted, flexible scope to look at the inside of your colon. How is this treated? Treatment for this condition involves removing any polyps that are found. Those polyps will then be tested for cancer. If cancer is found, your health care provider will talk to you about options for colon cancer treatment. Follow these instructions at home: Diet  Eat plenty of fiber, such as fruits, vegetables, and  whole grains.  Eat foods that are high in calcium and vitamin D, such as milk, cheese, yogurt, eggs, liver, fish, and broccoli.  Limit foods high in fat, red meats, and processed meats, such as hot dogs, sausage, bacon, and lunch meats.  Maintain a healthy weight, or lose weight if recommended by your health care provider. General instructions  Do not smoke cigarettes.  Do not drink alcohol excessively.  Keep all follow-up visits as told by your health care provider. This is important. This includes keeping regularly scheduled colonoscopies. Talk to your health care provider about when you need a colonoscopy.  Exercise every day or as told by your health care provider. Contact a health care provider if:  You have new or worsening bleeding during a bowel movement.  You have new or increased blood in your stool.  You have a change in bowel habits.  You unexpectedly lose weight. This information is not intended to replace advice given to you by your health care provider. Make sure you discuss any questions you have with your health care provider. Document Released: 07/11/2004 Document Revised: 03/22/2016 Document Reviewed: 09/05/2015 Elsevier Interactive Patient Education  2018 [employer].  Colonoscopy Discharge Instructions  Read the instructions outlined below and refer to this sheet in the next few weeks. These discharge instructions provide you with general information on caring for yourself after you leave the hospital. Your doctor may also give you specific instructions. While your treatment has been planned according to the most current medical practices available, unavoidable  complications occasionally occur. If you have any problems or questions after discharge, call Dr. Gala Romney at [phone]. ACTIVITY  You may resume your regular activity, but move at a slower pace for the next 24 hours.   Take frequent rest periods for the next 24 hours.   Walking will help get rid of  the air and reduce the bloated feeling in your belly (abdomen).   No driving for 24 hours (because of the medicine (anesthesia) used during the test).    Do not sign any important legal documents or operate any machinery for 24 hours (because of the anesthesia used during the test).  NUTRITION  Drink plenty of fluids.   You may resume your normal diet as instructed by your doctor.   Begin with a light meal and progress to your normal diet. Heavy or fried foods are harder to digest and may make you feel sick to your stomach (nauseated).   Avoid alcoholic beverages for 24 hours or as instructed.  MEDICATIONS  You may resume your normal medications unless your doctor tells you otherwise.  WHAT YOU CAN EXPECT TODAY  Some feelings of bloating in the abdomen.   Passage of more gas than usual.   Spotting of blood in your stool or on the toilet paper.  IF YOU HAD POLYPS REMOVED DURING THE COLONOSCOPY:  No aspirin products for 7 days or as instructed.   No alcohol for 7 days or as instructed.   Eat a soft diet for the next 24 hours.  FINDING OUT THE RESULTS OF YOUR TEST Not all test results are available during your visit. If your test results are not back during the visit, make an appointment with your caregiver to find out the results. Do not assume everything is normal if you have not heard from your caregiver or the medical facility. It is important for you to follow up on all of your test results.  SEEK IMMEDIATE MEDICAL ATTENTION IF:  You have more than a spotting of blood in your stool.   Your belly is swollen (abdominal distention).   You are nauseated or vomiting.   You have a temperature over 101.   You have abdominal pain or discomfort that is severe or gets worse throughout the day.

## 2018-03-10 ENCOUNTER — Encounter: Payer: Self-pay | Admitting: Internal Medicine

## 2018-03-11 ENCOUNTER — Ambulatory Visit: Payer: BLUE CROSS/BLUE SHIELD | Admitting: Orthopaedic Surgery

## 2018-03-11 ENCOUNTER — Encounter: Payer: Self-pay | Admitting: Orthopaedic Surgery

## 2018-03-11 VITALS — BP 129/83 | HR 92 | Ht 64.0 in | Wt 174.0 lb

## 2018-03-11 DIAGNOSIS — M25571 Pain in right ankle and joints of right foot: Secondary | ICD-10-CM

## 2018-03-11 DIAGNOSIS — F17210 Nicotine dependence, cigarettes, uncomplicated: Secondary | ICD-10-CM | POA: Diagnosis not present

## 2018-03-11 DIAGNOSIS — M5442 Lumbago with sciatica, left side: Secondary | ICD-10-CM

## 2018-03-11 DIAGNOSIS — G8929 Other chronic pain: Secondary | ICD-10-CM

## 2018-03-11 MED ORDER — PREDNISONE 5 MG (21) PO TBPK: ORAL_TABLET | ORAL | 0 refills | Status: DC

## 2018-03-11 NOTE — Progress Notes (Signed)
Patient Judith Rodriguez, female DOB:[birth year], 52 y.o. EYC:[id number]  Chief Complaint  Patient presents with  . Follow-up    Back Pain    HPI  Judith Rodriguez is a 52 y.o. female who has chronic lower back pain.  Her back is better but she is having right ankle pain laterally.  She has no trauma. She has no swelling or redness.  She has pain on a ladder and unlevel ground.  She has no discoloration.  She cannot take any NSAIDs as she gets significant rash. HPI  Body mass index is 29.87 kg/m.  ROS  Review of Systems  Constitutional:       Patient does not have Diabetes Mellitus. Patient does not have hypertension. Patient does not have COPD or shortness of breath. Patient does not have BMI > 35. Patient has current smoking history.  HENT: Negative for congestion.   Respiratory: Negative for cough and shortness of breath.   Cardiovascular: Negative for chest pain and leg swelling.  Endocrine: Negative for cold intolerance.  Musculoskeletal: Positive for arthralgias and back pain.  Allergic/Immunologic: Positive for environmental allergies.  All other systems reviewed and are negative.   Past Medical History:  Diagnosis Date  . Arthritis   . Bulging lumbar disc    pushing on sciatic nerve L4  . Carpal tunnel syndrome   . Depression 12/01/2015  . Dyslipidemia 12/05/2015   Will try diet and exercise first  . Hot flashes 12/01/2015  . Vitamin D deficiency 12/05/2015   Take vitamin D3 5000 iu daily    Past Surgical History:  Procedure Laterality Date  . ABDOMINAL HYSTERECTOMY    . CESAREAN SECTION    . SALPINGOOPHORECTOMY Left     Family History  Problem Relation Age of Onset  . Cancer Mother        lung  . Cancer Father        lung  . Hypertension Father   . Hypertension Brother   . Aneurysm Paternal Grandmother        brain  . Heart disease Paternal Grandfather        heart attack  . Hypertension Sister   . Colon cancer Paternal Aunt        less than age 36     Social History Social History   Tobacco Use  . Smoking status: Current Every Day Smoker    Packs/day: 0.25    Years: 33.00    Pack years: 8.25    Types: Cigarettes  . Smokeless tobacco: Never Used  Substance Use Topics  . Alcohol use: No  . Drug use: No    Allergies  Allergen Reactions  . Aspirin Hives    Current Outpatient Medications  Medication Sig Dispense Refill  . Cholecalciferol (VITAMIN D-3) 5000 units TABS Take 1 tablet by mouth daily.     . citalopram (CELEXA) 20 MG tablet Take 1 tablet (20 mg total) by mouth daily. (Patient taking differently: Take 20 mg by mouth at bedtime. ) 30 tablet 12  . diphenhydrAMINE (BENADRYL) 25 mg capsule Take 25 mg by mouth daily as needed for allergies.     Marland Kitchen HYDROcodone-acetaminophen (NORCO) 7.5-325 MG tablet Take 1 tablet by mouth every 6 (six) hours as needed for moderate pain. 45 tablet 0  . hydrocortisone (ANUSOL-HC) 2.5 % rectal cream Place 1 application rectally 2 (two) times daily. (Patient taking differently: Place 1 application rectally 2 (two) times daily as needed for hemorrhoids. ) 30 g 0  . Menthol, Topical Analgesic, (  BENGAY EX) Apply 1 application topically daily as needed (muscle pain).    . predniSONE (STERAPRED UNI-PAK 21 TAB) 5 MG (21) TBPK tablet Take 6 pills first day; 5 pills second day; 4 pills third day; 3 pills fourth day; 2 pills next day and 1 pill last day. 21 tablet 0   No current facility-administered medications for this visit.      Physical Exam  Blood pressure 129/83, pulse 92, height 5\' 4"  (1.626 m), weight 174 lb (78.9 kg).  Constitutional: overall normal hygiene, normal nutrition, well developed, normal grooming, normal body habitus. Assistive device:none  Musculoskeletal: gait and station Limp none, muscle tone and strength are normal, no tremors or atrophy is present.  .  Neurological: coordination overall normal.  Deep tendon reflex/nerve stretch intact.  Sensation normal.  Cranial nerves  II-XII intact.   Skin:   Normal overall no scars, lesions, ulcers or rashes. No psoriasis.  Psychiatric: Alert and oriented x 3.  Recent memory intact, remote memory unclear.  Normal mood and affect. Well groomed.  Good eye contact.  Cardiovascular: overall no swelling, no varicosities, no edema bilaterally, normal temperatures of the legs and arms, no clubbing, cyanosis and good capillary refill.  Lymphatic: palpation is normal.  Spine/Pelvis examination:  Inspection:  Overall, sacoiliac joint benign and hips nontender; without crepitus or defects.   Thoracic spine inspection: Alignment normal without kyphosis present   Lumbar spine inspection:  Alignment  with normal lumbar lordosis, without scoliosis apparent.   Thoracic spine palpation:  without tenderness of spinal processes   Lumbar spine palpation: without tenderness of lumbar area; without tightness of lumbar muscles    Range of Motion:   Lumbar flexion, forward flexion is normal without pain or tenderness    Lumbar extension is full without pain or tenderness   Left lateral bend is normal without pain or tenderness   Right lateral bend is normal without pain or tenderness   Straight leg raising is normal  Strength & tone: normal   Stability overall normal stability  Her right ankle has tenderness laterally but no swelling, no discoloration, no numbness. ROM is full.  She has slight tenderness of the lateral peroneal tendons.  NV intact. Gait is normal.  All other systems reviewed and are negative   The patient has been educated about the nature of the problem(s) and counseled on treatment options.  The patient appeared to understand what I have discussed and is in agreement with it.  Encounter Diagnoses  Name Primary?  . Chronic left-sided low back pain with left-sided sciatica Yes  . Cigarette nicotine dependence without complication   . Chronic pain of right ankle     PLAN Call if any problems.  Precautions  discussed.  Continue current medications. I will call in prednisone dose pack, instructions and precautions discussed.  Return to clinic 1 month   Electronically Signed Sanjuana Kava, MD 5/14/20198:48 AM

## 2018-03-11 NOTE — Patient Instructions (Signed)
Steps to Quit Smoking Smoking tobacco can be bad for your health. It can also affect almost every organ in your body. Smoking puts you and people around you at risk for many serious Marynell Bies-lasting (chronic) diseases. Quitting smoking is hard, but it is one of the best things that you can do for your health. It is never too late to quit. What are the benefits of quitting smoking? When you quit smoking, you lower your risk for getting serious diseases and conditions. They can include:  Lung cancer or lung disease.  Heart disease.  Stroke.  Heart attack.  Not being able to have children (infertility).  Weak bones (osteoporosis) and broken bones (fractures).  If you have coughing, wheezing, and shortness of breath, those symptoms may get better when you quit. You may also get sick less often. If you are pregnant, quitting smoking can help to lower your chances of having a baby of low birth weight. What can I do to help me quit smoking? Talk with your doctor about what can help you quit smoking. Some things you can do (strategies) include:  Quitting smoking totally, instead of slowly cutting back how much you smoke over a period of time.  Going to in-person counseling. You are more likely to quit if you go to many counseling sessions.  Using resources and support systems, such as: ? Online chats with a counselor. ? Phone quitlines. ? Printed self-help materials. ? Support groups or group counseling. ? Text messaging programs. ? Mobile phone apps or applications.  Taking medicines. Some of these medicines may have nicotine in them. If you are pregnant or breastfeeding, do not take any medicines to quit smoking unless your doctor says it is okay. Talk with your doctor about counseling or other things that can help you.  Talk with your doctor about using more than one strategy at the same time, such as taking medicines while you are also going to in-person counseling. This can help make  quitting easier. What things can I do to make it easier to quit? Quitting smoking might feel very hard at first, but there is a lot that you can do to make it easier. Take these steps:  Talk to your family and friends. Ask them to support and encourage you.  Call phone quitlines, reach out to support groups, or work with a counselor.  Ask people who smoke to not smoke around you.  Avoid places that make you want (trigger) to smoke, such as: ? Bars. ? Parties. ? Smoke-break areas at work.  Spend time with people who do not smoke.  Lower the stress in your life. Stress can make you want to smoke. Try these things to help your stress: ? Getting regular exercise. ? Deep-breathing exercises. ? Yoga. ? Meditating. ? Doing a body scan. To do this, close your eyes, focus on one area of your body at a time from head to toe, and notice which parts of your body are tense. Try to relax the muscles in those areas.  Download or buy apps on your mobile phone or tablet that can help you stick to your quit plan. There are many free apps, such as QuitGuide from the CDC (Centers for Disease Control and Prevention). You can find more support from smokefree.gov and other websites.  This information is not intended to replace advice given to you by your health care provider. Make sure you discuss any questions you have with your health care provider. Document Released: 08/11/2009 Document   Revised: 06/12/2016 Document Reviewed: 03/01/2015 Elsevier Interactive Patient Education  2018 Elsevier Inc.  

## 2018-04-01 ENCOUNTER — Encounter: Payer: Self-pay | Admitting: Orthopaedic Surgery

## 2018-04-01 ENCOUNTER — Ambulatory Visit (INDEPENDENT_AMBULATORY_CARE_PROVIDER_SITE_OTHER): Payer: BLUE CROSS/BLUE SHIELD

## 2018-04-01 ENCOUNTER — Ambulatory Visit: Payer: BLUE CROSS/BLUE SHIELD | Admitting: Orthopaedic Surgery

## 2018-04-01 VITALS — BP 133/76 | HR 87 | Temp 98.6°F | Ht 64.0 in | Wt 175.0 lb

## 2018-04-01 DIAGNOSIS — M25511 Pain in right shoulder: Secondary | ICD-10-CM

## 2018-04-01 DIAGNOSIS — M25571 Pain in right ankle and joints of right foot: Secondary | ICD-10-CM

## 2018-04-01 MED ORDER — HYDROCODONE-ACETAMINOPHEN 7.5-325 MG PO TABS: 1.0000 | ORAL_TABLET | Freq: Four times a day (QID) | ORAL | 0 refills | Status: DC | PRN

## 2018-04-01 NOTE — Progress Notes (Signed)
Patient Judith Rodriguez, female DOB:1965/12/10, 52 y.o. SWF:[id number]  Chief Complaint  Patient presents with  . Shoulder Pain    right   . Ankle Pain    right   . Fall    fell off ladder 03/28/18    HPI  Judith Rodriguez is a 52 y.o. female who fell off ladder and hurt her right shoulder and right ankle on Friday, May 31st.  She has pain in the right shoulder especially when trying overhead use and of the right ankle laterally.  She has taken Advil and used ice and also soaked the ankle.  She still has pain. She has no redness or weakness.   HPI  Body mass index is 30.04 kg/m.  ROS  Review of Systems  Constitutional:       Patient does not have Diabetes Mellitus. Patient does not have hypertension. Patient does not have COPD or shortness of breath. Patient does not have BMI > 35. Patient has current smoking history.  HENT: Negative for congestion.   Respiratory: Negative for cough and shortness of breath.   Cardiovascular: Negative for chest pain and leg swelling.  Endocrine: Negative for cold intolerance.  Musculoskeletal: Positive for arthralgias and back pain.  Allergic/Immunologic: Positive for environmental allergies.  All other systems reviewed and are negative.   Past Medical History:  Diagnosis Date  . Arthritis   . Bulging lumbar disc    pushing on sciatic nerve L4  . Carpal tunnel syndrome   . Depression 12/01/2015  . Dyslipidemia 12/05/2015   Will try diet and exercise first  . Hot flashes 12/01/2015  . Vitamin D deficiency 12/05/2015   Take vitamin D3 5000 iu daily    Past Surgical History:  Procedure Laterality Date  . ABDOMINAL HYSTERECTOMY    . CESAREAN SECTION    . COLONOSCOPY WITH PROPOFOL N/A 03/06/2018   Procedure: COLONOSCOPY WITH PROPOFOL;  Surgeon: Daneil Dolin, MD;  [facility];  Service: Endoscopy;  Laterality: N/A;  1:30pm  . POLYPECTOMY  03/06/2018   Procedure: POLYPECTOMY;  Surgeon: Daneil Dolin, MD;  Location: [facility];  Service:  Endoscopy;;  cecal polyp cs, hepatic flexure polyps  . SALPINGOOPHORECTOMY Left     Family History  Problem Relation Age of Onset  . Cancer Mother        lung  . Cancer Father        lung  . Hypertension Father   . Hypertension Brother   . Aneurysm Paternal Grandmother        brain  . Heart disease Paternal Grandfather        heart attack  . Hypertension Sister   . Colon cancer Paternal Aunt        less than age 36    Social History Social History   Tobacco Use  . Smoking status: Current Every Day Smoker    Packs/day: 0.25    Years: 33.00    Pack years: 8.25    Types: Cigarettes  . Smokeless tobacco: Never Used  Substance Use Topics  . Alcohol use: No  . Drug use: No    Allergies  Allergen Reactions  . Aspirin Hives    Current Outpatient Medications  Medication Sig Dispense Refill  . Cholecalciferol (VITAMIN D-3) 5000 units TABS Take 1 tablet by mouth daily.     . citalopram (CELEXA) 20 MG tablet Take 1 tablet (20 mg total) by mouth daily. (Patient taking differently: Take 20 mg by mouth at bedtime. )  30 tablet 12  . diphenhydrAMINE (BENADRYL) 25 mg capsule Take 25 mg by mouth daily as needed for allergies.     Marland Kitchen HYDROcodone-acetaminophen (NORCO) 7.5-325 MG tablet Take 1 tablet by mouth every 6 (six) hours as needed for moderate pain. 45 tablet 0  . hydrocortisone (ANUSOL-HC) 2.5 % rectal cream Place 1 application rectally 2 (two) times daily. (Patient taking differently: Place 1 application rectally 2 (two) times daily as needed for hemorrhoids. ) 30 g 0  . Menthol, Topical Analgesic, (BENGAY EX) Apply 1 application topically daily as needed (muscle pain).    . predniSONE (STERAPRED UNI-PAK 21 TAB) 5 MG (21) TBPK tablet Take 6 pills first day; 5 pills second day; 4 pills third day; 3 pills fourth day; 2 pills next day and 1 pill last day. (Patient not taking: Reported on 04/01/2018) 21 tablet 0   No current facility-administered medications for this visit.       Physical Exam  Blood pressure 133/76, pulse 87, temperature 98.6 F (37 C), height 5\' 4"  (1.626 m), weight 175 lb (79.4 kg).  Constitutional: overall normal hygiene, normal nutrition, well developed, normal grooming, normal body habitus. Assistive device:none  Musculoskeletal: gait and station Limp right, muscle tone and strength are normal, no tremors or atrophy is present.  .  Neurological: coordination overall normal.  Deep tendon reflex/nerve stretch intact.  Sensation normal.  Cranial nerves II-XII intact.   Skin:   Normal overall no scars, lesions, ulcers or rashes. No psoriasis.  Psychiatric: Alert and oriented x 3.  Recent memory intact, remote memory unclear.  Normal mood and affect. Well groomed.  Good eye contact.  Cardiovascular: overall no swelling, no varicosities, no edema bilaterally, normal temperatures of the legs and arms, no clubbing, cyanosis and good capillary refill.  Lymphatic: palpation is normal.  Right ankle with lateral swelling and tenderness of the anterior talofibular ligament.  ROM is full but painful.  NV intact.  Limp to the right.  Examination of right Upper Extremity is done.  Inspection:   Overall:  Elbow non-tender without crepitus or defects, forearm non-tender without crepitus or defects, wrist non-tender without crepitus or defects, hand non-tender.    Shoulder: with glenohumeral joint tenderness, without effusion.   Upper arm: without swelling and tenderness   Range of motion:   Overall:  Full range of motion of the elbow, full range of motion of wrist and full range of motion in fingers.   Shoulder:  right  110 degrees forward flexion; 90 degrees abduction; 35 degrees internal rotation, 35 degrees external rotation, 10 degrees extension, 35 degrees adduction.   Stability:   Overall:  Shoulder, elbow and wrist stable   Strength and Tone:   Overall full shoulder muscles strength, full upper arm strength and normal upper arm bulk and  tone.  All other systems reviewed and are negative   The patient has been educated about the nature of the problem(s) and counseled on treatment options.  The patient appeared to understand what I have discussed and is in agreement with it.  Encounter Diagnoses  Name Primary?  . Acute right ankle pain Yes  . Acute pain of right shoulder     X-rays of the right shoulder and right ankle were done, reported separately.  PLAN Call if any problems.  Precautions discussed.  Continue current medications. Contrast bath sheet of instructions given.  Return to clinic 2 weeks   I have reviewed the [facility] [facility] web site prior to  prescribing narcotic medicine for this patient.  Electronically Signed Sanjuana Kava, MD 6/4/20192:16 PM

## 2018-04-09 ENCOUNTER — Ambulatory Visit: Payer: BLUE CROSS/BLUE SHIELD | Admitting: Orthopaedic Surgery

## 2018-04-09 ENCOUNTER — Other Ambulatory Visit (HOSPITAL_COMMUNITY)
Admission: RE | Admit: 2018-04-09 | Discharge: 2018-04-09 | Disposition: A | Discharge status: Home / Self Care | Payer: BLUE CROSS/BLUE SHIELD | Source: Ambulatory Visit | Attending: Orthopaedic Surgery | Admitting: Orthopaedic Surgery

## 2018-04-09 ENCOUNTER — Encounter: Payer: Self-pay | Admitting: Orthopaedic Surgery

## 2018-04-09 VITALS — BP 133/74 | HR 83 | Ht 64.0 in | Wt 175.0 lb

## 2018-04-09 DIAGNOSIS — M25551 Pain in right hip: Secondary | ICD-10-CM | POA: Diagnosis not present

## 2018-04-09 DIAGNOSIS — M25562 Pain in left knee: Secondary | ICD-10-CM | POA: Diagnosis not present

## 2018-04-09 DIAGNOSIS — M25561 Pain in right knee: Secondary | ICD-10-CM | POA: Insufficient documentation

## 2018-04-09 DIAGNOSIS — M25511 Pain in right shoulder: Secondary | ICD-10-CM

## 2018-04-09 DIAGNOSIS — G8929 Other chronic pain: Secondary | ICD-10-CM | POA: Insufficient documentation

## 2018-04-09 DIAGNOSIS — M2550 Pain in unspecified joint: Secondary | ICD-10-CM | POA: Diagnosis not present

## 2018-04-09 DIAGNOSIS — M25552 Pain in left hip: Secondary | ICD-10-CM | POA: Diagnosis present

## 2018-04-09 LAB — CBC WITH DIFFERENTIAL/PLATELET
Basophils Absolute: 0 10*3/uL (ref 0.0–0.1)
Basophils Relative: 0 %
Eosinophils Absolute: 0.2 10*3/uL (ref 0.0–0.7)
Eosinophils Relative: 2 %
HEMATOCRIT: 40.9 % (ref 36.0–46.0)
Hemoglobin: 12.7 g/dL (ref 12.0–15.0)
LYMPHS ABS: 3.2 10*3/uL (ref 0.7–4.0)
LYMPHS PCT: 31 %
MCH: 32.1 pg (ref 26.0–34.0)
MCHC: 31.1 g/dL (ref 30.0–36.0)
MCV: 103.3 fL — ABNORMAL HIGH (ref 78.0–100.0)
MONOS PCT: 8 %
Monocytes Absolute: 0.9 10*3/uL (ref 0.1–1.0)
NEUTROS ABS: 6.2 10*3/uL (ref 1.7–7.7)
Neutrophils Relative %: 59 %
Platelets: 326 10*3/uL (ref 150–400)
RBC: 3.96 MIL/uL (ref 3.87–5.11)
RDW: 13.9 % (ref 11.5–15.5)
WBC: 10.4 10*3/uL (ref 4.0–10.5)

## 2018-04-09 LAB — SEDIMENTATION RATE: Sed Rate: 8 mm/hr (ref 0–22)

## 2018-04-09 MED ORDER — PREDNISONE 5 MG PO TABS: ORAL_TABLET | ORAL | 0 refills | Status: DC

## 2018-04-09 NOTE — Patient Instructions (Signed)
Steps to Quit Smoking Smoking tobacco can be bad for your health. It can also affect almost every organ in your body. Smoking puts you and people around you at risk for many serious long-lasting (chronic) diseases. Quitting smoking is hard, but it is one of the best things that you can do for your health. It is never too late to quit. What are the benefits of quitting smoking? When you quit smoking, you lower your risk for getting serious diseases and conditions. They can include:  Lung cancer or lung disease.  Heart disease.  Stroke.  Heart attack.  Not being able to have children (infertility).  Weak bones (osteoporosis) and broken bones (fractures).  If you have coughing, wheezing, and shortness of breath, those symptoms may get better when you quit. You may also get sick less often. If you are pregnant, quitting smoking can help to lower your chances of having a baby of low birth weight. What can I do to help me quit smoking? Talk with your doctor about what can help you quit smoking. Some things you can do (strategies) include:  Quitting smoking totally, instead of slowly cutting back how much you smoke over a period of time.  Going to in-person counseling. You are more likely to quit if you go to many counseling sessions.  Using resources and support systems, such as: ? Online chats with a counselor. ? Phone quitlines. ? Printed self-help materials. ? Support groups or group counseling. ? Text messaging programs. ? Mobile phone apps or applications.  Taking medicines. Some of these medicines may have nicotine in them. If you are pregnant or breastfeeding, do not take any medicines to quit smoking unless your doctor says it is okay. Talk with your doctor about counseling or other things that can help you.  Talk with your doctor about using more than one strategy at the same time, such as taking medicines while you are also going to in-person counseling. This can help make  quitting easier. What things can I do to make it easier to quit? Quitting smoking might feel very hard at first, but there is a lot that you can do to make it easier. Take these steps:  Talk to your family and friends. Ask them to support and encourage you.  Call phone quitlines, reach out to support groups, or work with a counselor.  Ask people who smoke to not smoke around you.  Avoid places that make you want (trigger) to smoke, such as: ? Bars. ? Parties. ? Smoke-break areas at work.  Spend time with people who do not smoke.  Lower the stress in your life. Stress can make you want to smoke. Try these things to help your stress: ? Getting regular exercise. ? Deep-breathing exercises. ? Yoga. ? Meditating. ? Doing a body scan. To do this, close your eyes, focus on one area of your body at a time from head to toe, and notice which parts of your body are tense. Try to relax the muscles in those areas.  Download or buy apps on your mobile phone or tablet that can help you stick to your quit plan. There are many free apps, such as QuitGuide from the CDC (Centers for Disease Control and Prevention). You can find more support from smokefree.gov and other websites.  This information is not intended to replace advice given to you by your health care provider. Make sure you discuss any questions you have with your health care provider. Document Released: 08/11/2009 Document   Revised: 06/12/2016 Document Reviewed: 03/01/2015 Elsevier Interactive Patient Education  2018 Elsevier Inc.  

## 2018-04-09 NOTE — Progress Notes (Signed)
Patient NO:BSJG [name], female DOB:1966/08/19, 52 y.o. GEZ:[id number]  Chief Complaint  Patient presents with  . Ankle Pain    right  . Shoulder Pain    right   . Neck Pain    HPI  Judith Rodriguez is a 52 y.o. female who is having more pain after she stopped the prednisone dose pack.  She cannot take NSAIDs.  She has neck pain on the right, right shoulder pain and some right ankle and foot pain.  She has pain in the hands first thing in the morning.  She has no new trauma.  I will get labs today to rule out other arthritis problem.  I will begin long prednisone taper dose.  HPI  Body mass index is 30.04 kg/m.  ROS  Review of Systems  Constitutional:       Patient does not have Diabetes Mellitus. Patient does not have hypertension. Patient does not have COPD or shortness of breath. Patient does not have BMI > 35. Patient has current smoking history.  HENT: Negative for congestion.   Respiratory: Negative for cough and shortness of breath.   Cardiovascular: Negative for chest pain and leg swelling.  Endocrine: Negative for cold intolerance.  Musculoskeletal: Positive for arthralgias and back pain.  Allergic/Immunologic: Positive for environmental allergies.  All other systems reviewed and are negative.   Past Medical History:  Diagnosis Date  . Arthritis   . Bulging lumbar disc    pushing on sciatic nerve L4  . Carpal tunnel syndrome   . Depression 12/01/2015  . Dyslipidemia 12/05/2015   Will try diet and exercise first  . Hot flashes 12/01/2015  . Vitamin D deficiency 12/05/2015   Take vitamin D3 5000 iu daily    Past Surgical History:  Procedure Laterality Date  . ABDOMINAL HYSTERECTOMY    . CESAREAN SECTION    . COLONOSCOPY WITH PROPOFOL N/A 03/06/2018   Procedure: COLONOSCOPY WITH PROPOFOL;  Surgeon: Daneil Dolin, MD;  [facility];  Service: Endoscopy;  Laterality: N/A;  1:30pm  . POLYPECTOMY  03/06/2018   Procedure: POLYPECTOMY;  Surgeon: Daneil Dolin, MD;   Location: [facility];  Service: Endoscopy;;  cecal polyp cs, hepatic flexure polyps  . SALPINGOOPHORECTOMY Left     Family History  Problem Relation Age of Onset  . Cancer Mother        lung  . Cancer Father        lung  . Hypertension Father   . Hypertension Brother   . Aneurysm Paternal Grandmother        brain  . Heart disease Paternal Grandfather        heart attack  . Hypertension Sister   . Colon cancer Paternal Aunt        less than age 41    Social History Social History   Tobacco Use  . Smoking status: Current Every Day Smoker    Packs/day: 0.25    Years: 33.00    Pack years: 8.25    Types: Cigarettes  . Smokeless tobacco: Never Used  Substance Use Topics  . Alcohol use: No  . Drug use: No    Allergies  Allergen Reactions  . Aspirin Hives    Current Outpatient Medications  Medication Sig Dispense Refill  . Cholecalciferol (VITAMIN D-3) 5000 units TABS Take 1 tablet by mouth daily.     . citalopram (CELEXA) 20 MG tablet Take 1 tablet (20 mg total) by mouth daily. (Patient taking differently: Take 20  mg by mouth at bedtime. ) 30 tablet 12  . diphenhydrAMINE (BENADRYL) 25 mg capsule Take 25 mg by mouth daily as needed for allergies.     Marland Kitchen HYDROcodone-acetaminophen (NORCO) 7.5-325 MG tablet Take 1 tablet by mouth every 6 (six) hours as needed for moderate pain. 45 tablet 0  . hydrocortisone (ANUSOL-HC) 2.5 % rectal cream Place 1 application rectally 2 (two) times daily. (Patient taking differently: Place 1 application rectally 2 (two) times daily as needed for hemorrhoids. ) 30 g 0  . Menthol, Topical Analgesic, (BENGAY EX) Apply 1 application topically daily as needed (muscle pain).     No current facility-administered medications for this visit.      Physical Exam  Blood pressure 133/74, pulse 83, height 5\' 4"  (1.626 m), weight 175 lb (79.4 kg).  Constitutional: overall normal hygiene, normal nutrition, well developed, normal grooming, normal body  habitus. Assistive device:none  Musculoskeletal: gait and station Limp none, muscle tone and strength are normal, no tremors or atrophy is present.  .  Neurological: coordination overall normal.  Deep tendon reflex/nerve stretch intact.  Sensation normal.  Cranial nerves II-XII intact.   Skin:   Normal overall no scars, lesions, ulcers or rashes. No psoriasis.  Psychiatric: Alert and oriented x 3.  Recent memory intact, remote memory unclear.  Normal mood and affect. Well groomed.  Good eye contact.  Cardiovascular: overall no swelling, no varicosities, no edema bilaterally, normal temperatures of the legs and arms, no clubbing, cyanosis and good capillary refill.  Lymphatic: palpation is normal.  Her neck is tender over the right upper side but has no spasm, ROM full.  NV intact.  The right shoulder is tender in the extremes but has full motion.   All other systems reviewed and are negative   The patient has been educated about the nature of the problem(s) and counseled on treatment options.  The patient appeared to understand what I have discussed and is in agreement with it.  Encounter Diagnoses  Name Primary?  . Chronic pain of multiple joints   . Pain in joint of right shoulder   . Chronic arthralgias of knees and hips Yes    PLAN Call if any problems.  Precautions discussed.  Begin month long prednisone taper.  Get labs.   Return to clinic 1 week   Electronically Signed Sanjuana Kava, MD 6/12/20198:27 AM

## 2018-04-10 LAB — RHEUMATOID FACTOR

## 2018-04-11 LAB — ANA: Anti Nuclear Antibody(ANA): NEGATIVE

## 2018-04-15 ENCOUNTER — Ambulatory Visit: Payer: BLUE CROSS/BLUE SHIELD | Admitting: Orthopaedic Surgery

## 2018-04-16 ENCOUNTER — Encounter: Payer: Self-pay | Admitting: Orthopaedic Surgery

## 2018-04-16 ENCOUNTER — Ambulatory Visit: Payer: BLUE CROSS/BLUE SHIELD | Admitting: Orthopaedic Surgery

## 2018-04-16 VITALS — BP 148/89 | HR 87 | Ht 64.0 in | Wt 175.0 lb

## 2018-04-16 DIAGNOSIS — M25552 Pain in left hip: Secondary | ICD-10-CM | POA: Diagnosis not present

## 2018-04-16 DIAGNOSIS — M25561 Pain in right knee: Secondary | ICD-10-CM

## 2018-04-16 DIAGNOSIS — M25551 Pain in right hip: Secondary | ICD-10-CM

## 2018-04-16 DIAGNOSIS — M25562 Pain in left knee: Secondary | ICD-10-CM

## 2018-04-16 DIAGNOSIS — G8929 Other chronic pain: Secondary | ICD-10-CM | POA: Diagnosis not present

## 2018-04-16 NOTE — Progress Notes (Signed)
Patient Judith Rodriguez, female DOB:1966-08-09, 52 y.o. ZTI:[id number]  Chief Complaint  Patient presents with  . Follow-up    lab results    HPI  Judith Rodriguez is a 52 y.o. female who has continued multiple joint pains.  She was put on a month long prednisone taper last week. She had labs done which are negative except for elevated MCV.  She has seen rheumatologist in [place] last year but was told they had nothing to offer.  She continues to hurt and have hand pain and joint swelling.  Her hips also hurt.  [facility] if [facility] have any other suggestions. HPI  Body mass index is 30.04 kg/m.  ROS  Review of Systems  Constitutional:       Patient does not have Diabetes Mellitus. Patient does not have hypertension. Patient does not have COPD or shortness of breath. Patient does not have BMI > 35. Patient has current smoking history.  HENT: Negative for congestion.   Respiratory: Negative for cough and shortness of breath.   Cardiovascular: Negative for chest pain and leg swelling.  Endocrine: Negative for cold intolerance.  Musculoskeletal: Positive for arthralgias and back pain.  Allergic/Immunologic: Positive for environmental allergies.  All other systems reviewed and are negative.   Past Medical History:  Diagnosis Date  . Arthritis   . Bulging lumbar disc    pushing on sciatic nerve L4  . Carpal tunnel syndrome   . Depression 12/01/2015  . Dyslipidemia 12/05/2015   Will try diet and exercise first  . Hot flashes 12/01/2015  . Vitamin D deficiency 12/05/2015   Take vitamin D3 5000 iu daily    Past Surgical History:  Procedure Laterality Date  . ABDOMINAL HYSTERECTOMY    . CESAREAN SECTION    . COLONOSCOPY WITH PROPOFOL N/A 03/06/2018   Procedure: COLONOSCOPY WITH PROPOFOL;  Surgeon: Daneil Dolin, MD;  [facility];  Service: Endoscopy;  Laterality: N/A;  1:30pm  . POLYPECTOMY  03/06/2018   Procedure: POLYPECTOMY;   Surgeon: Daneil Dolin, MD;  [facility];  Service: Endoscopy;;  cecal polyp cs, hepatic flexure polyps  . SALPINGOOPHORECTOMY Left     Family History  Problem Relation Age of Onset  . Cancer Mother        lung  . Cancer Father        lung  . Hypertension Father   . Hypertension Brother   . Aneurysm Paternal Grandmother        brain  . Heart disease Paternal Grandfather        heart attack  . Hypertension Sister   . Colon cancer Paternal Aunt        less than age 20    Social History Social History   Tobacco Use  . Smoking status: Current Every Day Smoker    Packs/day: 0.25    Years: 33.00    Pack years: 8.25    Types: Cigarettes  . Smokeless tobacco: Never Used  Substance Use Topics  . Alcohol use: No  . Drug use: No    Allergies  Allergen Reactions  . Aspirin Hives    Current Outpatient Medications  Medication Sig Dispense Refill  . Cholecalciferol (VITAMIN D-3) 5000 units TABS Take 1 tablet by mouth daily.     . citalopram (CELEXA) 20 MG tablet Take 1 tablet (20 mg total) by mouth daily. (Patient taking differently: Take 20 mg by mouth at bedtime. )  30 tablet 12  . diphenhydrAMINE (BENADRYL) 25 mg capsule Take 25 mg by mouth daily as needed for allergies.     Marland Kitchen HYDROcodone-acetaminophen (NORCO) 7.5-325 MG tablet Take 1 tablet by mouth every 6 (six) hours as needed for moderate pain. 45 tablet 0  . hydrocortisone (ANUSOL-HC) 2.5 % rectal cream Place 1 application rectally 2 (two) times daily. (Patient taking differently: Place 1 application rectally 2 (two) times daily as needed for hemorrhoids. ) 30 g 0  . Menthol, Topical Analgesic, (BENGAY EX) Apply 1 application topically daily as needed (muscle pain).    . predniSONE (DELTASONE) 5 MG tablet Take 4 tablets daily x 7 days; then 3 tablets daily x 7 days; then 2 tablets daily x 7 days; then one tablet daily x 7 days, then 1/2 tablet daily until gone. 75 tablet 0   No current facility-administered  medications for this visit.      Physical Exam  Blood pressure (!) 148/89, pulse 87, height 5\' 4"  (1.626 m), weight 175 lb (79.4 kg).  Constitutional: overall normal hygiene, normal nutrition, well developed, normal grooming, normal body habitus. Assistive device:none  Musculoskeletal: gait and station Limp none, muscle tone and strength are normal, no tremors or atrophy is present.  .  Neurological: coordination overall normal.  Deep tendon reflex/nerve stretch intact.  Sensation normal.  Cranial nerves II-XII intact.   Skin:   Normal overall no scars, lesions, ulcers or rashes. No psoriasis.  Psychiatric: Alert and oriented x 3.  Recent memory intact, remote memory unclear.  Normal mood and affect. Well groomed.  Good eye contact.  Cardiovascular: overall no swelling, no varicosities, no edema bilaterally, normal temperatures of the legs and arms, no clubbing, cyanosis and good capillary refill.  Lymphatic: palpation is normal.  Her hands have no swelling or pain today. NV intact. All other systems reviewed and are negative   The patient has been educated about the nature of the problem(s) and counseled on treatment options.  The patient appeared to understand what I have discussed and is in agreement with it.  Encounter Diagnosis  Name Primary?  . Chronic arthralgias of knees and hips Yes    PLAN Call if any problems.  Precautions discussed.  Continue current medications.   Return to clinic 1 month   Electronically Signed Sanjuana Kava, MD 6/19/20198:30 AM

## 2018-04-16 NOTE — Patient Instructions (Signed)
Steps to Quit Smoking Smoking tobacco can be bad for your health. It can also affect almost every organ in your body. Smoking puts you and people around you at risk for many serious Kayle Correa-lasting (chronic) diseases. Quitting smoking is hard, but it is one of the best things that you can do for your health. It is never too late to quit. What are the benefits of quitting smoking? When you quit smoking, you lower your risk for getting serious diseases and conditions. They can include:  Lung cancer or lung disease.  Heart disease.  Stroke.  Heart attack.  Not being able to have children (infertility).  Weak bones (osteoporosis) and broken bones (fractures).  If you have coughing, wheezing, and shortness of breath, those symptoms may get better when you quit. You may also get sick less often. If you are pregnant, quitting smoking can help to lower your chances of having a baby of low birth weight. What can I do to help me quit smoking? Talk with your doctor about what can help you quit smoking. Some things you can do (strategies) include:  Quitting smoking totally, instead of slowly cutting back how much you smoke over a period of time.  Going to in-person counseling. You are more likely to quit if you go to many counseling sessions.  Using resources and support systems, such as: ? Online chats with a counselor. ? Phone quitlines. ? Printed self-help materials. ? Support groups or group counseling. ? Text messaging programs. ? Mobile phone apps or applications.  Taking medicines. Some of these medicines may have nicotine in them. If you are pregnant or breastfeeding, do not take any medicines to quit smoking unless your doctor says it is okay. Talk with your doctor about counseling or other things that can help you.  Talk with your doctor about using more than one strategy at the same time, such as taking medicines while you are also going to in-person counseling. This can help make  quitting easier. What things can I do to make it easier to quit? Quitting smoking might feel very hard at first, but there is a lot that you can do to make it easier. Take these steps:  Talk to your family and friends. Ask them to support and encourage you.  Call phone quitlines, reach out to support groups, or work with a counselor.  Ask people who smoke to not smoke around you.  Avoid places that make you want (trigger) to smoke, such as: ? Bars. ? Parties. ? Smoke-break areas at work.  Spend time with people who do not smoke.  Lower the stress in your life. Stress can make you want to smoke. Try these things to help your stress: ? Getting regular exercise. ? Deep-breathing exercises. ? Yoga. ? Meditating. ? Doing a body scan. To do this, close your eyes, focus on one area of your body at a time from head to toe, and notice which parts of your body are tense. Try to relax the muscles in those areas.  Download or buy apps on your mobile phone or tablet that can help you stick to your quit plan. There are many free apps, such as QuitGuide from the CDC (Centers for Disease Control and Prevention). You can find more support from smokefree.gov and other websites.  This information is not intended to replace advice given to you by your health care provider. Make sure you discuss any questions you have with your health care provider. Document Released: 08/11/2009 Document   Revised: 06/12/2016 Document Reviewed: 03/01/2015 Elsevier Interactive Patient Education  2018 Elsevier Inc.  

## 2018-04-28 ENCOUNTER — Telehealth: Payer: Self-pay | Admitting: Orthopaedic Surgery

## 2018-04-28 MED ORDER — HYDROCODONE-ACETAMINOPHEN 7.5-325 MG PO TABS: 1.0000 | ORAL_TABLET | Freq: Four times a day (QID) | ORAL | 0 refills | Status: DC | PRN

## 2018-04-28 NOTE — Telephone Encounter (Signed)
Hydrocodone-Acetaminophen  7.5/325 mg    Patient states you and her had discussed the quantity being adjusted until she can get in to see the rheumatologist.  PATIENT USES [facility] IN [place]

## 2018-04-28 NOTE — Telephone Encounter (Signed)
I do not recall telling her I would call her. I called her to discuss. She may be asking about the rheumatologist appointment. Judith Rodriguez has faxed over information. She can call [facility] to check on this if she has not heard anything  I left message for her to call me back.

## 2018-04-28 NOTE — Telephone Encounter (Signed)
Patient wants you to call her when you can. Stated you told her to call you if she hadn't heard from you. [phone]

## 2018-04-28 NOTE — Telephone Encounter (Signed)
Patient called back states was in regards to the Rheumatologist appointment, but this has not been scheduled yet, is pending. Will check on this, not today, in clinic all day with Dr Aline Brochure. Patient is aware

## 2018-04-29 NOTE — Telephone Encounter (Signed)
She was advised yesterday by Inez Catalina will check on this today. I have asked Glynda to check on the referral with [facility].

## 2018-04-30 ENCOUNTER — Telehealth: Payer: Self-pay | Admitting: Orthopaedic Surgery

## 2018-04-30 MED ORDER — HYDROCODONE-ACETAMINOPHEN 7.5-325 MG PO TABS: 1.0000 | ORAL_TABLET | Freq: Four times a day (QID) | ORAL | 0 refills | Status: DC | PRN

## 2018-04-30 NOTE — Telephone Encounter (Signed)
Hydrocodone-Acetaminophen  7.5/325 mg  Qty 75 Tablets  PATIENT USES [facility] IN [place]  You sent this on 04/28/18 but patient states her pharmacy said they didn't receive anything.

## 2018-05-14 ENCOUNTER — Ambulatory Visit: Payer: BLUE CROSS/BLUE SHIELD | Admitting: Orthopaedic Surgery

## 2018-05-14 ENCOUNTER — Encounter: Payer: Self-pay | Admitting: Orthopaedic Surgery

## 2018-05-14 VITALS — BP 144/87 | HR 69 | Ht 64.0 in | Wt 172.0 lb

## 2018-05-14 DIAGNOSIS — F17210 Nicotine dependence, cigarettes, uncomplicated: Secondary | ICD-10-CM | POA: Diagnosis not present

## 2018-05-14 DIAGNOSIS — M25552 Pain in left hip: Secondary | ICD-10-CM

## 2018-05-14 DIAGNOSIS — M0579 Rheumatoid arthritis with rheumatoid factor of multiple sites without organ or systems involvement: Secondary | ICD-10-CM

## 2018-05-14 DIAGNOSIS — M2550 Pain in unspecified joint: Secondary | ICD-10-CM | POA: Diagnosis not present

## 2018-05-14 DIAGNOSIS — M25551 Pain in right hip: Secondary | ICD-10-CM | POA: Diagnosis not present

## 2018-05-14 DIAGNOSIS — M25562 Pain in left knee: Secondary | ICD-10-CM

## 2018-05-14 DIAGNOSIS — G8929 Other chronic pain: Secondary | ICD-10-CM

## 2018-05-14 DIAGNOSIS — M25561 Pain in right knee: Secondary | ICD-10-CM

## 2018-05-14 NOTE — Progress Notes (Signed)
Patient YK:DXIP Gimpel, female DOB:1966-03-24, 52 y.o. JAS:[id number]  Chief Complaint  Patient presents with  . Follow-up    HPI  Judith Rodriguez is a 52 y.o. female who has rheumatoid arthritis with multiple site pains, more in the hands and the back.  Her hands are better with the warmer weather.  She has been on prednisone for a month by me and it helped but her pains have returned off the medicine.  Her back is still very painful at times.  She has an appointment to see the rheumatologist at [facility] on June 03, 2018.  I am glad we were able to get an appointment so soon.  I await their review.    Body mass index is 29.52 kg/m.  ROS  Review of Systems  Constitutional:       Patient does not have Diabetes Mellitus. Patient does not have hypertension. Patient does not have COPD or shortness of breath. Patient does not have BMI > 35. Patient has current smoking history.  HENT: Negative for congestion.   Respiratory: Negative for cough and shortness of breath.   Cardiovascular: Negative for chest pain and leg swelling.  Endocrine: Negative for cold intolerance.  Musculoskeletal: Positive for arthralgias and back pain.  Allergic/Immunologic: Positive for environmental allergies.  All other systems reviewed and are negative.   All other systems reviewed and are negative.  Past Medical History:  Diagnosis Date  . Arthritis   . Bulging lumbar disc    pushing on sciatic nerve L4  . Carpal tunnel syndrome   . Depression 12/01/2015  . Dyslipidemia 12/05/2015   Will try diet and exercise first  . Hot flashes 12/01/2015  . Vitamin D deficiency 12/05/2015   Take vitamin D3 5000 iu daily    Past Surgical History:  Procedure Laterality Date  . ABDOMINAL HYSTERECTOMY    . CESAREAN SECTION    . COLONOSCOPY WITH PROPOFOL N/A 03/06/2018   Procedure: COLONOSCOPY WITH PROPOFOL;  Surgeon: Daneil Dolin, MD;  [facility];  Service: Endoscopy;  Laterality: N/A;  1:30pm  .  POLYPECTOMY  03/06/2018   Procedure: POLYPECTOMY;  Surgeon: Daneil Dolin, MD;  [facility];  Service: Endoscopy;;  cecal polyp cs, hepatic flexure polyps  . SALPINGOOPHORECTOMY Left     Family History  Problem Relation Age of Onset  . Cancer Mother        lung  . Cancer Father        lung  . Hypertension Father   . Hypertension Brother   . Aneurysm Paternal Grandmother        brain  . Heart disease Paternal Grandfather        heart attack  . Hypertension Sister   . Colon cancer Paternal Aunt        less than age 11    Social History Social History   Tobacco Use  . Smoking status: Current Every Day Smoker    Packs/day: 0.25    Years: 33.00    Pack years: 8.25    Types: Cigarettes  . Smokeless tobacco: Never Used  Substance Use Topics  . Alcohol use: No  . Drug use: No    Allergies  Allergen Reactions  . Aspirin Hives    Current Outpatient Medications  Medication Sig Dispense Refill  . Cholecalciferol (VITAMIN D-3) 5000 units TABS Take 1 tablet by mouth daily.     . citalopram (CELEXA) 20 MG tablet Take 1 tablet (20 mg total) by  mouth daily. (Patient taking differently: Take 20 mg by mouth at bedtime. ) 30 tablet 12  . diphenhydrAMINE (BENADRYL) 25 mg capsule Take 25 mg by mouth daily as needed for allergies.     Marland Kitchen HYDROcodone-acetaminophen (NORCO) 7.5-325 MG tablet Take 1 tablet by mouth every 6 (six) hours as needed for moderate pain. 75 tablet 0  . hydrocortisone (ANUSOL-HC) 2.5 % rectal cream Place 1 application rectally 2 (two) times daily. (Patient taking differently: Place 1 application rectally 2 (two) times daily as needed for hemorrhoids. ) 30 g 0  . Menthol, Topical Analgesic, (BENGAY EX) Apply 1 application topically daily as needed (muscle pain).    . predniSONE (DELTASONE) 5 MG tablet Take 4 tablets daily x 7 days; then 3 tablets daily x 7 days; then 2 tablets daily x 7 days; then one tablet daily x 7 days, then 1/2 tablet daily until gone.  75 tablet 0   No current facility-administered medications for this visit.      Physical Exam  Blood pressure (!) 144/87, pulse 69, height 5\' 4"  (1.626 m), weight 172 lb (78 kg).  Constitutional: overall normal hygiene, normal nutrition, well developed, normal grooming, normal body habitus. Assistive device:none  Musculoskeletal: gait and station Limp none, muscle tone and strength are normal, no tremors or atrophy is present.  .  Neurological: coordination overall normal.  Deep tendon reflex/nerve stretch intact.  Sensation normal.  Cranial nerves II-XII intact.   Skin:   Normal overall no scars, lesions, ulcers or rashes. No psoriasis.  Psychiatric: Alert and oriented x 3.  Recent memory intact, remote memory unclear.  Normal mood and affect. Well groomed.  Good eye contact.  Cardiovascular: overall no swelling, no varicosities, no edema bilaterally, normal temperatures of the legs and arms, no clubbing, cyanosis and good capillary refill.  Lymphatic: palpation is normal.  Her hands are not swollen today and have very good motion.  Spine/Pelvis examination:  Inspection:  Overall, sacoiliac joint benign and hips nontender; without crepitus or defects.   Thoracic spine inspection: Alignment normal without kyphosis present   Lumbar spine inspection:  Alignment  with normal lumbar lordosis, without scoliosis apparent.   Thoracic spine palpation:  without tenderness of spinal processes   Lumbar spine palpation: without tenderness of lumbar area; without tightness of lumbar muscles    Range of Motion:   Lumbar flexion, forward flexion is normal without pain or tenderness    Lumbar extension is full without pain or tenderness   Left lateral bend is normal without pain or tenderness   Right lateral bend is normal without pain or tenderness   Straight leg raising is normal  Strength & tone: normal   Stability overall normal stability All other systems reviewed and are negative    The patient has been educated about the nature of the problem(s) and counseled on treatment options.  The patient appeared to understand what I have discussed and is in agreement with it.  Encounter Diagnoses  Name Primary?  . Chronic arthralgias of knees and hips Yes  . Chronic pain of multiple joints   . Cigarette nicotine dependence without complication   . Rheumatoid arthritis involving multiple sites with positive rheumatoid factor ([place])     PLAN Call if any problems.  Precautions discussed.  Continue current medications.   Return to clinic 1 month   To go to [facility].  Electronically Signed Sanjuana Kava, MD 7/17/20198:51 AM

## 2018-05-28 ENCOUNTER — Telehealth: Payer: Self-pay | Admitting: Orthopaedic Surgery

## 2018-05-28 MED ORDER — HYDROCODONE-ACETAMINOPHEN 7.5-325 MG PO TABS: 1.0000 | ORAL_TABLET | Freq: Four times a day (QID) | ORAL | 0 refills | Status: DC | PRN

## 2018-05-28 NOTE — Telephone Encounter (Signed)
Patient requests refill on Hydrocodone/Acetaminophen 7.5-325 mgs.   Qty  75       Sig: Take 1 tablet by mouth every 6 (six) hours as needed for moderate pain.   Patient states she uses [facility] in [place]

## 2018-06-12 ENCOUNTER — Ambulatory Visit: Payer: BLUE CROSS/BLUE SHIELD | Admitting: Orthopaedic Surgery

## 2018-06-18 ENCOUNTER — Encounter: Payer: Self-pay | Admitting: Orthopaedic Surgery

## 2018-06-18 ENCOUNTER — Ambulatory Visit: Payer: BLUE CROSS/BLUE SHIELD | Admitting: Orthopaedic Surgery

## 2018-06-18 ENCOUNTER — Other Ambulatory Visit: Payer: Self-pay | Admitting: *Deleted

## 2018-06-18 VITALS — BP 117/71 | HR 92 | Ht 64.0 in | Wt 180.0 lb

## 2018-06-18 DIAGNOSIS — M2550 Pain in unspecified joint: Secondary | ICD-10-CM

## 2018-06-18 DIAGNOSIS — M25552 Pain in left hip: Secondary | ICD-10-CM

## 2018-06-18 DIAGNOSIS — M25551 Pain in right hip: Secondary | ICD-10-CM

## 2018-06-18 DIAGNOSIS — F17210 Nicotine dependence, cigarettes, uncomplicated: Secondary | ICD-10-CM

## 2018-06-18 DIAGNOSIS — M25561 Pain in right knee: Secondary | ICD-10-CM

## 2018-06-18 DIAGNOSIS — G8929 Other chronic pain: Secondary | ICD-10-CM

## 2018-06-18 DIAGNOSIS — M545 Low back pain: Secondary | ICD-10-CM

## 2018-06-18 DIAGNOSIS — M25562 Pain in left knee: Secondary | ICD-10-CM

## 2018-06-18 MED ORDER — CITALOPRAM HYDROBROMIDE 20 MG PO TABS: 20.0000 mg | ORAL_TABLET | Freq: Every day | ORAL | 12 refills | Status: DC

## 2018-06-18 NOTE — Progress Notes (Signed)
Patient Judith Rodriguez, female DOB:11-27-1965, 52 y.o. [id number]  Chief Complaint  Patient presents with  . Follow-up    back pain    HPI  Judith Rodriguez is a 52 y.o. female who has lower back pain and chronic multiple joint pains.  She is to have a radiofrequency ablation procedure by the neurosurgeon tomorrow on her back.  She is stable today.  She has significant pain which the procedure should help.     Body mass index is 30.9 kg/m.  ROS  Review of Systems  Constitutional:       Patient does not have Diabetes Mellitus. Patient does not have hypertension. Patient does not have COPD or shortness of breath. Patient does not have BMI > 35. Patient has current smoking history.  HENT: Negative for congestion.   Respiratory: Negative for cough and shortness of breath.   Cardiovascular: Negative for chest pain and leg swelling.  Endocrine: Negative for cold intolerance.  Musculoskeletal: Positive for arthralgias and back pain.  Allergic/Immunologic: Positive for environmental allergies.  All other systems reviewed and are negative.   All other systems reviewed and are negative.  The following is a summary of the past history medically, past history surgically, known current medicines, social history and family history.  This information is gathered electronically by the computer from prior information and documentation.  I review this each visit and have found including this information at this point in the chart is beneficial and informative.    Past Medical History:  Diagnosis Date  . Arthritis   . Bulging lumbar disc    pushing on sciatic nerve L4  . Carpal tunnel syndrome   . Depression 12/01/2015  . Dyslipidemia 12/05/2015   Will try diet and exercise first  . Hot flashes 12/01/2015  . Vitamin D deficiency 12/05/2015   Take vitamin D3 5000 iu daily    Past Surgical History:  Procedure Laterality Date  . ABDOMINAL HYSTERECTOMY    . CESAREAN SECTION    . COLONOSCOPY WITH  PROPOFOL N/A 03/06/2018   Procedure: COLONOSCOPY WITH PROPOFOL;  Surgeon: Daneil Dolin, MD;  [facility];  Service: Endoscopy;  Laterality: N/A;  1:30pm  . POLYPECTOMY  03/06/2018   Procedure: POLYPECTOMY;  Surgeon: Daneil Dolin, MD;  [facility];  Service: Endoscopy;;  cecal polyp cs, hepatic flexure polyps  . SALPINGOOPHORECTOMY Left     Family History  Problem Relation Age of Onset  . Cancer Mother        lung  . Cancer Father        lung  . Hypertension Father   . Hypertension Brother   . Aneurysm Paternal Grandmother        brain  . Heart disease Paternal Grandfather        heart attack  . Hypertension Sister   . Colon cancer Paternal Aunt        less than age 59    Social History Social History   Tobacco Use  . Smoking status: Current Every Day Smoker    Packs/day: 0.25    Years: 33.00    Pack years: 8.25    Types: Cigarettes  . Smokeless tobacco: Never Used  Substance Use Topics  . Alcohol use: No  . Drug use: No    Allergies  Allergen Reactions  . Aspirin Hives    Current Outpatient Medications  Medication Sig Dispense Refill  . Cholecalciferol (VITAMIN D-3) 5000 units TABS Take 1 tablet by mouth daily.     Marland Kitchen  citalopram (CELEXA) 20 MG tablet Take 1 tablet (20 mg total) by mouth daily. 30 tablet 12  . diphenhydrAMINE (BENADRYL) 25 mg capsule Take 25 mg by mouth daily as needed for allergies.     Marland Kitchen HYDROcodone-acetaminophen (NORCO) 7.5-325 MG tablet Take 1 tablet by mouth every 6 (six) hours as needed for moderate pain. 75 tablet 0  . hydrocortisone (ANUSOL-HC) 2.5 % rectal cream Place 1 application rectally 2 (two) times daily. (Patient taking differently: Place 1 application rectally 2 (two) times daily as needed for hemorrhoids. ) 30 g 0  . Menthol, Topical Analgesic, (BENGAY EX) Apply 1 application topically daily as needed (muscle pain).    . predniSONE (DELTASONE) 5 MG tablet Take 4 tablets daily x 7 days; then 3 tablets daily  x 7 days; then 2 tablets daily x 7 days; then one tablet daily x 7 days, then 1/2 tablet daily until gone. 75 tablet 0   No current facility-administered medications for this visit.      Physical Exam  Blood pressure 117/71, pulse 92, height 5\' 4"  (1.626 m), weight 180 lb (81.6 kg).  Constitutional: overall normal hygiene, normal nutrition, well developed, normal grooming, normal body habitus. Assistive device:none  Musculoskeletal: gait and station Limp none, muscle tone and strength are normal, no tremors or atrophy is present.  .  Neurological: coordination overall normal.  Deep tendon reflex/nerve stretch intact.  Sensation normal.  Cranial nerves II-XII intact.   Skin:   Normal overall no scars, lesions, ulcers or rashes. No psoriasis.  Psychiatric: Alert and oriented x 3.  Recent memory intact, remote memory unclear.  Normal mood and affect. Well groomed.  Good eye contact.  Cardiovascular: overall no swelling, no varicosities, no edema bilaterally, normal temperatures of the legs and arms, no clubbing, cyanosis and good capillary refill.  Lymphatic: palpation is normal.  Spine/Pelvis examination:  Inspection:  Overall, sacoiliac joint benign and hips nontender; without crepitus or defects.   Thoracic spine inspection: Alignment normal without kyphosis present   Lumbar spine inspection:  Alignment  with normal lumbar lordosis, without scoliosis apparent.   Thoracic spine palpation:  without tenderness of spinal processes   Lumbar spine palpation: without tenderness of lumbar area; without tightness of lumbar muscles    Range of Motion:   Lumbar flexion, forward flexion is normal without pain or tenderness    Lumbar extension is full without pain or tenderness   Left lateral bend is normal without pain or tenderness   Right lateral bend is normal without pain or tenderness   Straight leg raising is normal  Strength & tone: normal   Stability overall normal  stability All other systems reviewed and are negative   The patient has been educated about the nature of the problem(s) and counseled on treatment options.  The patient appeared to understand what I have discussed and is in agreement with it.  Encounter Diagnoses  Name Primary?  . Chronic midline low back pain without sciatica Yes  . Chronic arthralgias of knees and hips   . Chronic pain of multiple joints   . Cigarette nicotine dependence without complication     PLAN Call if any problems.  Precautions discussed.  Continue current medications.   Return to clinic 1 month   Electronically Signed Sanjuana Kava, MD 8/21/201910:04 AM

## 2018-06-25 ENCOUNTER — Telehealth: Payer: Self-pay | Admitting: Orthopaedic Surgery

## 2018-06-25 MED ORDER — HYDROCODONE-ACETAMINOPHEN 7.5-325 MG PO TABS: 1.0000 | ORAL_TABLET | Freq: Four times a day (QID) | ORAL | 0 refills | Status: DC | PRN

## 2018-06-25 NOTE — Telephone Encounter (Signed)
Hydrocodone-Acetaminophen  7.5/325 mg  Qty 75 Tablets   PATIENT USES [facility]

## 2018-07-22 ENCOUNTER — Telehealth: Payer: Self-pay | Admitting: Orthopaedic Surgery

## 2018-07-22 NOTE — Telephone Encounter (Signed)
Patient requests refill on Hydrocodone/Acetaminophen 7.5-325 mgs.  Qty  75       Sig: Take 1 tablet by mouth every 6 (six) hours as needed for moderate pain.        [facility] uses [facility] [place]

## 2018-07-23 MED ORDER — HYDROCODONE-ACETAMINOPHEN 7.5-325 MG PO TABS: 1.0000 | ORAL_TABLET | Freq: Four times a day (QID) | ORAL | 0 refills | Status: DC | PRN

## 2018-08-08 ENCOUNTER — Encounter: Payer: Self-pay | Admitting: Internal Medicine

## 2018-08-13 ENCOUNTER — Telehealth: Payer: Self-pay | Admitting: Orthopaedic Surgery

## 2018-08-13 MED ORDER — HYDROCODONE-ACETAMINOPHEN 7.5-325 MG PO TABS: 1.0000 | ORAL_TABLET | Freq: Four times a day (QID) | ORAL | 0 refills | Status: DC | PRN

## 2018-08-13 NOTE — Telephone Encounter (Signed)
Hydrocodone-Acetaminophen  7.5/325 mg  Qty 75 Tablets  PATIENT USES [facility]  She is aware it is not due until next week, but is asking for it to be sent and let them fill when due since you will not be in office next week.

## 2018-09-04 ENCOUNTER — Ambulatory Visit: Payer: BLUE CROSS/BLUE SHIELD | Admitting: Orthopaedic Surgery

## 2018-09-04 ENCOUNTER — Encounter: Payer: Self-pay | Admitting: Orthopaedic Surgery

## 2018-09-04 VITALS — BP 123/79 | HR 74 | Ht 64.0 in | Wt 176.0 lb

## 2018-09-04 DIAGNOSIS — F17210 Nicotine dependence, cigarettes, uncomplicated: Secondary | ICD-10-CM | POA: Diagnosis not present

## 2018-09-04 DIAGNOSIS — G8929 Other chronic pain: Secondary | ICD-10-CM | POA: Diagnosis not present

## 2018-09-04 DIAGNOSIS — M25551 Pain in right hip: Secondary | ICD-10-CM | POA: Diagnosis not present

## 2018-09-04 DIAGNOSIS — M25561 Pain in right knee: Secondary | ICD-10-CM

## 2018-09-04 DIAGNOSIS — M25552 Pain in left hip: Secondary | ICD-10-CM

## 2018-09-04 DIAGNOSIS — M545 Low back pain: Secondary | ICD-10-CM | POA: Diagnosis not present

## 2018-09-04 DIAGNOSIS — M25562 Pain in left knee: Secondary | ICD-10-CM

## 2018-09-04 MED ORDER — PREDNISONE 5 MG (21) PO TBPK
ORAL_TABLET | ORAL | 0 refills | Status: DC
Start: 2018-09-04 — End: 2018-10-16

## 2018-09-04 MED ORDER — HYDROCODONE-ACETAMINOPHEN 7.5-325 MG PO TABS: 1.0000 | ORAL_TABLET | Freq: Four times a day (QID) | ORAL | 0 refills | Status: DC | PRN

## 2018-09-04 NOTE — Progress Notes (Signed)
Patient Judith Rodriguez, female DOB:[birth year], 52 y.o. IDP:[id number]  Chief Complaint  Patient presents with  . Back Pain    Back pain.    HPI  Judith Rodriguez is a 52 y.o. female who has chronic lower back pain.  She saw the neurosurgeon and had one area radio ablation for a nerve area.  She says that helped.  She has two other spots she wants him to treat.  Her back pain is worse with the cooler weather.  She has no radiation of the pain.  She has no new trauma, no weakness. She is taking her medicine and being active.   Body mass index is 30.21 kg/m.  ROS  Review of Systems  Constitutional:       Patient does not have Diabetes Mellitus. Patient does not have hypertension. Patient does not have COPD or shortness of breath. Patient does not have BMI > 35. Patient has current smoking history.  HENT: Negative for congestion.   Respiratory: Negative for cough and shortness of breath.   Cardiovascular: Negative for chest pain and leg swelling.  Endocrine: Negative for cold intolerance.  Musculoskeletal: Positive for arthralgias and back pain.  Allergic/Immunologic: Positive for environmental allergies.  All other systems reviewed and are negative.   All other systems reviewed and are negative.  The following is a summary of the past history medically, past history surgically, known current medicines, social history and family history.  This information is gathered electronically by the computer from prior information and documentation.  I review this each visit and have found including this information at this point in the chart is beneficial and informative.    Past Medical History:  Diagnosis Date  . Arthritis   . Bulging lumbar disc    pushing on sciatic nerve L4  . Carpal tunnel syndrome   . Depression 12/01/2015  . Dyslipidemia 12/05/2015   Will try diet and exercise first  . Hot flashes 12/01/2015  . Vitamin D deficiency 12/05/2015   Take vitamin D3 5000 iu daily    Past  Surgical History:  Procedure Laterality Date  . ABDOMINAL HYSTERECTOMY    . CESAREAN SECTION    . COLONOSCOPY WITH PROPOFOL N/A 03/06/2018   Procedure: COLONOSCOPY WITH PROPOFOL;  Surgeon: Daneil Dolin, MD;  [facility];  Service: Endoscopy;  Laterality: N/A;  1:30pm  . POLYPECTOMY  03/06/2018   Procedure: POLYPECTOMY;  Surgeon: Daneil Dolin, MD;  Location: [facility];  Service: Endoscopy;;  cecal polyp cs, hepatic flexure polyps  . SALPINGOOPHORECTOMY Left     Family History  Problem Relation Age of Onset  . Cancer Mother        lung  . Cancer Father        lung  . Hypertension Father   . Hypertension Brother   . Aneurysm Paternal Grandmother        brain  . Heart disease Paternal Grandfather        heart attack  . Hypertension Sister   . Colon cancer Paternal Aunt        less than age 3    Social History Social History   Tobacco Use  . Smoking status: Current Every Day Smoker    Packs/day: 0.25    Years: 33.00    Pack years: 8.25    Types: Cigarettes  . Smokeless tobacco: Never Used  Substance Use Topics  . Alcohol use: No  . Drug use: No    Allergies  Allergen Reactions  .  Aspirin Hives    Current Outpatient Medications  Medication Sig Dispense Refill  . Cholecalciferol (VITAMIN D-3) 5000 units TABS Take 1 tablet by mouth daily.     . citalopram (CELEXA) 20 MG tablet Take 1 tablet (20 mg total) by mouth daily. 30 tablet 12  . diphenhydrAMINE (BENADRYL) 25 mg capsule Take 25 mg by mouth daily as needed for allergies.     Marland Kitchen HYDROcodone-acetaminophen (NORCO) 7.5-325 MG tablet Take 1 tablet by mouth every 6 (six) hours as needed for moderate pain. 70 tablet 0  . hydrocortisone (ANUSOL-HC) 2.5 % rectal cream Place 1 application rectally 2 (two) times daily. (Patient taking differently: Place 1 application rectally 2 (two) times daily as needed for hemorrhoids. ) 30 g 0  . Menthol, Topical Analgesic, (BENGAY EX) Apply 1 application topically daily  as needed (muscle pain).    . predniSONE (STERAPRED UNI-PAK 21 TAB) 5 MG (21) TBPK tablet Take 6 pills first day; 5 pills second day; 4 pills third day; 3 pills fourth day; 2 pills next day and 1 pill last day. 21 tablet 0   No current facility-administered medications for this visit.      Physical Exam  Blood pressure 123/79, pulse 74, height 5\' 4"  (1.626 m), weight 176 lb (79.8 kg).  Constitutional: overall normal hygiene, normal nutrition, well developed, normal grooming, normal body habitus. Assistive device:none  Musculoskeletal: gait and station Limp none, muscle tone and strength are normal, no tremors or atrophy is present.  .  Neurological: coordination overall normal.  Deep tendon reflex/nerve stretch intact.  Sensation normal.  Cranial nerves II-XII intact.   Skin:   Normal overall no scars, lesions, ulcers or rashes. No psoriasis.  Psychiatric: Alert and oriented x 3.  Recent memory intact, remote memory unclear.  Normal mood and affect. Well groomed.  Good eye contact.  Cardiovascular: overall no swelling, no varicosities, no edema bilaterally, normal temperatures of the legs and arms, no clubbing, cyanosis and good capillary refill.  Lymphatic: palpation is normal.  Spine/Pelvis examination:  Inspection:  Overall, sacoiliac joint benign and hips nontender; without crepitus or defects.   Thoracic spine inspection: Alignment normal without kyphosis present   Lumbar spine inspection:  Alignment  with normal lumbar lordosis, without scoliosis apparent.   Thoracic spine palpation:  without tenderness of spinal processes   Lumbar spine palpation: without tenderness of lumbar area; without tightness of lumbar muscles    Range of Motion:   Lumbar flexion, forward flexion is normal without pain or tenderness    Lumbar extension is full without pain or tenderness   Left lateral bend is normal without pain or tenderness   Right lateral bend is normal without pain or  tenderness   Straight leg raising is normal  Strength & tone: normal   Stability overall normal stability All other systems reviewed and are negative   The patient has been educated about the nature of the problem(s) and counseled on treatment options.  The patient appeared to understand what I have discussed and is in agreement with it.  Encounter Diagnoses  Name Primary?  . Chronic midline low back pain without sciatica Yes  . Chronic arthralgias of knees and hips   . Cigarette nicotine dependence without complication     PLAN Call if any problems.  Precautions discussed.  Continue current medications.   Return to clinic 6 weeks   I have reviewed the [place] web site prior to prescribing narcotic medicine for this  patient.   Electronically Signed Sanjuana Kava, MD 11/7/20199:26 AM

## 2018-09-30 ENCOUNTER — Telehealth: Payer: Self-pay | Admitting: Orthopaedic Surgery

## 2018-09-30 MED ORDER — HYDROCODONE-ACETAMINOPHEN 7.5-325 MG PO TABS: 1.0000 | ORAL_TABLET | Freq: Four times a day (QID) | ORAL | 0 refills | Status: DC | PRN

## 2018-09-30 NOTE — Telephone Encounter (Signed)
Hydrocodone-Acetaminophen  7.5/325 mg  Qty 70 Tablets  PATIENT USES [facility] IN [place]

## 2018-10-16 ENCOUNTER — Ambulatory Visit: Payer: BLUE CROSS/BLUE SHIELD | Admitting: Orthopaedic Surgery

## 2018-10-16 ENCOUNTER — Encounter: Payer: Self-pay | Admitting: Orthopaedic Surgery

## 2018-10-16 VITALS — BP 141/83 | HR 91 | Ht 64.0 in | Wt 176.0 lb

## 2018-10-16 DIAGNOSIS — M25562 Pain in left knee: Secondary | ICD-10-CM

## 2018-10-16 DIAGNOSIS — G8929 Other chronic pain: Secondary | ICD-10-CM

## 2018-10-16 DIAGNOSIS — F17210 Nicotine dependence, cigarettes, uncomplicated: Secondary | ICD-10-CM

## 2018-10-16 DIAGNOSIS — M545 Low back pain, unspecified: Secondary | ICD-10-CM

## 2018-10-16 DIAGNOSIS — M25551 Pain in right hip: Secondary | ICD-10-CM

## 2018-10-16 DIAGNOSIS — M25552 Pain in left hip: Secondary | ICD-10-CM

## 2018-10-16 DIAGNOSIS — M25561 Pain in right knee: Secondary | ICD-10-CM

## 2018-10-16 MED ORDER — PREDNISONE 5 MG (21) PO TBPK: ORAL_TABLET | ORAL | 0 refills | Status: DC

## 2018-10-16 NOTE — Patient Instructions (Signed)
Steps to Quit Smoking    Smoking tobacco can be bad for your health. It can also affect almost every organ in your body. Smoking puts you and people around you at risk for many serious long-lasting (chronic) diseases. Quitting smoking is hard, but it is one of the best things that you can do for your health. It is never too late to quit.  What are the benefits of quitting smoking?  When you quit smoking, you lower your risk for getting serious diseases and conditions. They can include:  · Lung cancer or lung disease.  · Heart disease.  · Stroke.  · Heart attack.  · Not being able to have children (infertility).  · Weak bones (osteoporosis) and broken bones (fractures).  If you have coughing, wheezing, and shortness of breath, those symptoms may get better when you quit. You may also get sick less often. If you are pregnant, quitting smoking can help to lower your chances of having a baby of low birth weight.  What can I do to help me quit smoking?  Talk with your doctor about what can help you quit smoking. Some things you can do (strategies) include:  · Quitting smoking totally, instead of slowly cutting back how much you smoke over a period of time.  · Going to in-person counseling. You are more likely to quit if you go to many counseling sessions.  · Using resources and support systems, such as:  ? Online chats with a counselor.  ? Phone quitlines.  ? Printed self-help materials.  ? Support groups or group counseling.  ? Text messaging programs.  ? Mobile phone apps or applications.  · Taking medicines. Some of these medicines may have nicotine in them. If you are pregnant or breastfeeding, do not take any medicines to quit smoking unless your doctor says it is okay. Talk with your doctor about counseling or other things that can help you.  Talk with your doctor about using more than one strategy at the same time, such as taking medicines while you are also going to in-person counseling. This can help make  quitting easier.  What things can I do to make it easier to quit?  Quitting smoking might feel very hard at first, but there is a lot that you can do to make it easier. Take these steps:  · Talk to your family and friends. Ask them to support and encourage you.  · Call phone quitlines, reach out to support groups, or work with a counselor.  · Ask people who smoke to not smoke around you.  · Avoid places that make you want (trigger) to smoke, such as:  ? Bars.  ? Parties.  ? Smoke-break areas at work.  · Spend time with people who do not smoke.  · Lower the stress in your life. Stress can make you want to smoke. Try these things to help your stress:  ? Getting regular exercise.  ? Deep-breathing exercises.  ? Yoga.  ? Meditating.  ? Doing a body scan. To do this, close your eyes, focus on one area of your body at a time from head to toe, and notice which parts of your body are tense. Try to relax the muscles in those areas.  · Download or buy apps on your mobile phone or tablet that can help you stick to your quit plan. There are many free apps, such as QuitGuide from the CDC (Centers for Disease Control and Prevention). You can find more   support from smokefree.gov and other websites.  This information is not intended to replace advice given to you by your health care provider. Make sure you discuss any questions you have with your health care provider.  Document Released: 08/11/2009 Document Revised: 06/12/2016 Document Reviewed: 03/01/2015  Elsevier Interactive Patient Education © 2019 Elsevier Inc.

## 2018-10-16 NOTE — Progress Notes (Signed)
Patient Judith Rodriguez, female DOB:1966/10/20, 52 y.o. [id number]  Chief Complaint  Patient presents with  . Back Pain    HPI  Judith Rodriguez is a 52 y.o. female who has lower back pain that is stable.  She has had more pain with the cold weather.  She has no new trauma, no weakness.  She is taking her medicine.  She is doing her excises.  She is still smoking but has cut back.   Body mass index is 30.21 kg/m.  ROS  Review of Systems  Constitutional:       Patient does not have Diabetes Mellitus. Patient does not have hypertension. Patient does not have COPD or shortness of breath. Patient does not have BMI > 35. Patient has current smoking history.  HENT: Negative for congestion.   Respiratory: Negative for cough and shortness of breath.   Cardiovascular: Negative for chest pain and leg swelling.  Endocrine: Negative for cold intolerance.  Musculoskeletal: Positive for arthralgias and back pain.  Allergic/Immunologic: Positive for environmental allergies.  All other systems reviewed and are negative.   All other systems reviewed and are negative.  The following is a summary of the past history medically, past history surgically, known current medicines, social history and family history.  This information is gathered electronically by the computer from prior information and documentation.  I review this each visit and have found including this information at this point in the chart is beneficial and informative.    Past Medical History:  Diagnosis Date  . Arthritis   . Bulging lumbar disc    pushing on sciatic nerve L4  . Carpal tunnel syndrome   . Depression 12/01/2015  . Dyslipidemia 12/05/2015   Will try diet and exercise first  . Hot flashes 12/01/2015  . Vitamin D deficiency 12/05/2015   Take vitamin D3 5000 iu daily    Past Surgical History:  Procedure Laterality Date  . ABDOMINAL HYSTERECTOMY    . CESAREAN SECTION    . COLONOSCOPY WITH PROPOFOL N/A 03/06/2018   Procedure: COLONOSCOPY WITH PROPOFOL;  Surgeon: Daneil Dolin, MD;  [facility];  Service: Endoscopy;  Laterality: N/A;  1:30pm  . POLYPECTOMY  03/06/2018   Procedure: POLYPECTOMY;  Surgeon: Daneil Dolin, MD;  [facility];  Service: Endoscopy;;  cecal polyp cs, hepatic flexure polyps  . SALPINGOOPHORECTOMY Left     Family History  Problem Relation Age of Onset  . Cancer Mother        lung  . Cancer Father        lung  . Hypertension Father   . Hypertension Brother   . Aneurysm Paternal Grandmother        brain  . Heart disease Paternal Grandfather        heart attack  . Hypertension Sister   . Colon cancer Paternal Aunt        less than age 11    Social History Social History   Tobacco Use  . Smoking status: Current Every Day Smoker    Packs/day: 0.25    Years: 33.00    Pack years: 8.25    Types: Cigarettes  . Smokeless tobacco: Never Used  Substance Use Topics  . Alcohol use: No  . Drug use: No    Allergies  Allergen Reactions  . Aspirin Hives    Current Outpatient Medications  Medication Sig Dispense Refill  . Cholecalciferol (VITAMIN D-3) 5000 units TABS Take 1 tablet by mouth daily.     Marland Kitchen  citalopram (CELEXA) 20 MG tablet Take 1 tablet (20 mg total) by mouth daily. 30 tablet 12  . diphenhydrAMINE (BENADRYL) 25 mg capsule Take 25 mg by mouth daily as needed for allergies.     Marland Kitchen HYDROcodone-acetaminophen (NORCO) 7.5-325 MG tablet Take 1 tablet by mouth every 6 (six) hours as needed for moderate pain. 70 tablet 0  . hydrocortisone (ANUSOL-HC) 2.5 % rectal cream Place 1 application rectally 2 (two) times daily. (Patient taking differently: Place 1 application rectally 2 (two) times daily as needed for hemorrhoids. ) 30 g 0  . Menthol, Topical Analgesic, (BENGAY EX) Apply 1 application topically daily as needed (muscle pain).    . predniSONE (STERAPRED UNI-PAK 21 TAB) 5 MG (21) TBPK tablet Take 6 pills first day; 5 pills second day; 4 pills  third day; 3 pills fourth day; 2 pills next day and 1 pill last day. 21 tablet 0   No current facility-administered medications for this visit.      Physical Exam  Blood pressure (!) 141/83, pulse 91, height 5\' 4"  (1.626 m), weight 176 lb (79.8 kg).  Constitutional: overall normal hygiene, normal nutrition, well developed, normal grooming, normal body habitus. Assistive device:none  Musculoskeletal: gait and station Limp none, muscle tone and strength are normal, no tremors or atrophy is present.  .  Neurological: coordination overall normal.  Deep tendon reflex/nerve stretch intact.  Sensation normal.  Cranial nerves II-XII intact.   Skin:   Normal overall no scars, lesions, ulcers or rashes. No psoriasis.  Psychiatric: Alert and oriented x 3.  Recent memory intact, remote memory unclear.  Normal mood and affect. Well groomed.  Good eye contact.  Cardiovascular: overall no swelling, no varicosities, no edema bilaterally, normal temperatures of the legs and arms, no clubbing, cyanosis and good capillary refill.  Lymphatic: palpation is normal.  Spine/Pelvis examination:  Inspection:  Overall, sacoiliac joint benign and hips nontender; without crepitus or defects.   Thoracic spine inspection: Alignment normal without kyphosis present   Lumbar spine inspection:  Alignment  with normal lumbar lordosis, without scoliosis apparent.   Thoracic spine palpation:  without tenderness of spinal processes   Lumbar spine palpation: without tenderness of lumbar area; without tightness of lumbar muscles    Range of Motion:   Lumbar flexion, forward flexion is normal without pain or tenderness    Lumbar extension is full without pain or tenderness   Left lateral bend is normal without pain or tenderness   Right lateral bend is normal without pain or tenderness   Straight leg raising is normal  Strength & tone: normal   Stability overall normal stability  All other systems reviewed and  are negative   The patient has been educated about the nature of the problem(s) and counseled on treatment options.  The patient appeared to understand what I have discussed and is in agreement with it.  Encounter Diagnoses  Name Primary?  . Chronic midline low back pain without sciatica Yes  . Chronic arthralgias of knees and hips   . Cigarette nicotine dependence without complication     PLAN Call if any problems.  Precautions discussed.  Continue current medications.   Return to clinic 6 weeks   Electronically Signed Sanjuana Kava, MD 12/19/20199:32 AM

## 2018-10-30 ENCOUNTER — Telehealth: Payer: Self-pay | Admitting: Orthopaedic Surgery

## 2018-10-30 MED ORDER — HYDROCODONE-ACETAMINOPHEN 7.5-325 MG PO TABS: 1.0000 | ORAL_TABLET | Freq: Four times a day (QID) | ORAL | 0 refills | Status: DC | PRN

## 2018-10-30 NOTE — Telephone Encounter (Signed)
Patient called for refill:  HYDROcodone-acetaminophen (NORCO) 7.5-325 MG tablet 70 tablet  -[place]

## 2018-11-27 ENCOUNTER — Ambulatory Visit: Payer: Self-pay | Admitting: Orthopaedic Surgery

## 2018-11-27 ENCOUNTER — Encounter: Payer: Self-pay | Admitting: Orthopaedic Surgery

## 2018-11-27 VITALS — BP 129/63 | HR 88 | Ht 64.0 in | Wt 176.0 lb

## 2018-11-27 DIAGNOSIS — F17210 Nicotine dependence, cigarettes, uncomplicated: Secondary | ICD-10-CM

## 2018-11-27 DIAGNOSIS — M545 Low back pain: Secondary | ICD-10-CM

## 2018-11-27 DIAGNOSIS — M25552 Pain in left hip: Secondary | ICD-10-CM

## 2018-11-27 DIAGNOSIS — M25561 Pain in right knee: Secondary | ICD-10-CM

## 2018-11-27 DIAGNOSIS — M25562 Pain in left knee: Secondary | ICD-10-CM

## 2018-11-27 DIAGNOSIS — M25551 Pain in right hip: Secondary | ICD-10-CM

## 2018-11-27 DIAGNOSIS — M2550 Pain in unspecified joint: Secondary | ICD-10-CM

## 2018-11-27 DIAGNOSIS — G8929 Other chronic pain: Secondary | ICD-10-CM

## 2018-11-27 MED ORDER — HYDROCODONE-ACETAMINOPHEN 7.5-325 MG PO TABS: 1.0000 | ORAL_TABLET | Freq: Four times a day (QID) | ORAL | 0 refills | Status: DC | PRN

## 2018-11-27 MED ORDER — PREDNISONE 5 MG PO TABS: ORAL_TABLET | ORAL | 0 refills | Status: DC

## 2018-11-27 NOTE — Progress Notes (Signed)
Patient [name], female DOB:Mar 17, 1966, 53 y.o. [id number]  Chief Complaint  Patient presents with  . Back Pain    HPI  Judith Rodriguez is a 53 y.o. female who has multiple joint pains and lower back pain.  She did well with the prednisone as long as she was on it. She cannot take NSAIDs.  I will begin a month long tapered dose of prednisone. She has no insurance and cannot afford to see rheumatologist.  I have gone over precautions of the prednisone with her.   Body mass index is 30.21 kg/m.  ROS  Review of Systems  Constitutional:       Patient does not have Diabetes Mellitus. Patient does not have hypertension. Patient does not have COPD or shortness of breath. Patient does not have BMI > 35. Patient has current smoking history.  HENT: Negative for congestion.   Respiratory: Negative for cough and shortness of breath.   Cardiovascular: Negative for chest pain and leg swelling.  Endocrine: Negative for cold intolerance.  Musculoskeletal: Positive for arthralgias and back pain.  Allergic/Immunologic: Positive for environmental allergies.  All other systems reviewed and are negative.   All other systems reviewed and are negative.  The following is a summary of the past history medically, past history surgically, known current medicines, social history and family history.  This information is gathered electronically by the computer from prior information and documentation.  I review this each visit and have found including this information at this point in the chart is beneficial and informative.    Past Medical History:  Diagnosis Date  . Arthritis   . Bulging lumbar disc    pushing on sciatic nerve L4  . Carpal tunnel syndrome   . Depression 12/01/2015  . Dyslipidemia 12/05/2015   Will try diet and exercise first  . Hot flashes 12/01/2015  . Vitamin D deficiency 12/05/2015   Take vitamin D3 5000 iu daily    Past Surgical History:  Procedure Laterality Date  .  ABDOMINAL HYSTERECTOMY    . CESAREAN SECTION    . COLONOSCOPY WITH PROPOFOL N/A 03/06/2018   Procedure: COLONOSCOPY WITH PROPOFOL;  Surgeon: Daneil Dolin, MD;  [facility];  Service: Endoscopy;  Laterality: N/A;  1:30pm  . POLYPECTOMY  03/06/2018   Procedure: POLYPECTOMY;  Surgeon: Daneil Dolin, MD;  [facility];  Service: Endoscopy;;  cecal polyp cs, hepatic flexure polyps  . SALPINGOOPHORECTOMY Left     Family History  Problem Relation Age of Onset  . Cancer Mother        lung  . Cancer Father        lung  . Hypertension Father   . Hypertension Brother   . Aneurysm Paternal Grandmother        brain  . Heart disease Paternal Grandfather        heart attack  . Hypertension Sister   . Colon cancer Paternal Aunt        less than age 61    Social History Social History   Tobacco Use  . Smoking status: Current Every Day Smoker    Packs/day: 0.25    Years: 33.00    Pack years: 8.25    Types: Cigarettes  . Smokeless tobacco: Never Used  Substance Use Topics  . Alcohol use: No  . Drug use: No    Allergies  Allergen Reactions  . Aspirin Hives    Current Outpatient Medications  Medication Sig Dispense Refill  .  Cholecalciferol (VITAMIN D-3) 5000 units TABS Take 1 tablet by mouth daily.     . citalopram (CELEXA) 20 MG tablet Take 1 tablet (20 mg total) by mouth daily. 30 tablet 12  . diphenhydrAMINE (BENADRYL) 25 mg capsule Take 25 mg by mouth daily as needed for allergies.     Marland Kitchen HYDROcodone-acetaminophen (NORCO) 7.5-325 MG tablet Take 1 tablet by mouth every 6 (six) hours as needed for moderate pain. 70 tablet 0  . hydrocortisone (ANUSOL-HC) 2.5 % rectal cream Place 1 application rectally 2 (two) times daily. (Patient taking differently: Place 1 application rectally 2 (two) times daily as needed for hemorrhoids. ) 30 g 0  . Menthol, Topical Analgesic, (BENGAY EX) Apply 1 application topically daily as needed (muscle pain).    . predniSONE  (DELTASONE) 5 MG tablet 4 pills a day for a week, then 3 pills a day for a week, then 2 pills a day for a week, then 1 pill a day for a week, then 1/2 a pill until finished 75 tablet 0   No current facility-administered medications for this visit.      Physical Exam  Blood pressure 129/63, pulse 88, height 5\' 4"  (1.626 m), weight 176 lb (79.8 kg).  Constitutional: overall normal hygiene, normal nutrition, well developed, normal grooming, normal body habitus. Assistive device:none  Musculoskeletal: gait and station Limp none, muscle tone and strength are normal, no tremors or atrophy is present.  .  Neurological: coordination overall normal.  Deep tendon reflex/nerve stretch intact.  Sensation normal.  Cranial nerves II-XII intact.   Skin:   Normal overall no scars, lesions, ulcers or rashes. No psoriasis.  Psychiatric: Alert and oriented x 3.  Recent memory intact, remote memory unclear.  Normal mood and affect. Well groomed.  Good eye contact.  Cardiovascular: overall no swelling, no varicosities, no edema bilaterally, normal temperatures of the legs and arms, no clubbing, cyanosis and good capillary refill.  Lymphatic: palpation is normal.  Her hands have tenderness of the DIP and PIP joints but no swelling.   Spine/Pelvis examination:  Inspection:  Overall, sacoiliac joint benign and hips nontender; without crepitus or defects.   Thoracic spine inspection: Alignment normal without kyphosis present   Lumbar spine inspection:  Alignment  with normal lumbar lordosis, without scoliosis apparent.   Thoracic spine palpation:  without tenderness of spinal processes   Lumbar spine palpation: without tenderness of lumbar area; without tightness of lumbar muscles    Range of Motion:   Lumbar flexion, forward flexion is normal without pain or tenderness    Lumbar extension is full without pain or tenderness   Left lateral bend is normal without pain or tenderness   Right lateral  bend is normal without pain or tenderness   Straight leg raising is normal  Strength & tone: normal   Stability overall normal stability  All other systems reviewed and are negative   The patient has been educated about the nature of the problem(s) and counseled on treatment options.  The patient appeared to understand what I have discussed and is in agreement with it.  Encounter Diagnoses  Name Primary?  . Chronic pain of multiple joints   . Chronic midline low back pain without sciatica Yes  . Chronic arthralgias of knees and hips   . Cigarette nicotine dependence without complication     PLAN Call if any problems.  Precautions discussed. Begin month long prednisone tapered dose.  Return to clinic 1 month   I have reviewed  the [facility] Controlled Substance Reporting System web site prior to prescribing narcotic medicine for this patient.   Electronically Signed Sanjuana Kava, MD 1/30/20209:48 AM

## 2018-11-27 NOTE — Patient Instructions (Signed)
Steps to Quit Smoking    Smoking tobacco can be bad for your health. It can also affect almost every organ in your body. Smoking puts you and people around you at risk for many serious long-lasting (chronic) diseases. Quitting smoking is hard, but it is one of the best things that you can do for your health. It is never too late to quit.  What are the benefits of quitting smoking?  When you quit smoking, you lower your risk for getting serious diseases and conditions. They can include:  · Lung cancer or lung disease.  · Heart disease.  · Stroke.  · Heart attack.  · Not being able to have children (infertility).  · Weak bones (osteoporosis) and broken bones (fractures).  If you have coughing, wheezing, and shortness of breath, those symptoms may get better when you quit. You may also get sick less often. If you are pregnant, quitting smoking can help to lower your chances of having a baby of low birth weight.  What can I do to help me quit smoking?  Talk with your doctor about what can help you quit smoking. Some things you can do (strategies) include:  · Quitting smoking totally, instead of slowly cutting back how much you smoke over a period of time.  · Going to in-person counseling. You are more likely to quit if you go to many counseling sessions.  · Using resources and support systems, such as:  ? Online chats with a counselor.  ? Phone quitlines.  ? Printed self-help materials.  ? Support groups or group counseling.  ? Text messaging programs.  ? Mobile phone apps or applications.  · Taking medicines. Some of these medicines may have nicotine in them. If you are pregnant or breastfeeding, do not take any medicines to quit smoking unless your doctor says it is okay. Talk with your doctor about counseling or other things that can help you.  Talk with your doctor about using more than one strategy at the same time, such as taking medicines while you are also going to in-person counseling. This can help make  quitting easier.  What things can I do to make it easier to quit?  Quitting smoking might feel very hard at first, but there is a lot that you can do to make it easier. Take these steps:  · Talk to your family and friends. Ask them to support and encourage you.  · Call phone quitlines, reach out to support groups, or work with a counselor.  · Ask people who smoke to not smoke around you.  · Avoid places that make you want (trigger) to smoke, such as:  ? Bars.  ? Parties.  ? Smoke-break areas at work.  · Spend time with people who do not smoke.  · Lower the stress in your life. Stress can make you want to smoke. Try these things to help your stress:  ? Getting regular exercise.  ? Deep-breathing exercises.  ? Yoga.  ? Meditating.  ? Doing a body scan. To do this, close your eyes, focus on one area of your body at a time from head to toe, and notice which parts of your body are tense. Try to relax the muscles in those areas.  · Download or buy apps on your mobile phone or tablet that can help you stick to your quit plan. There are many free apps, such as QuitGuide from the CDC (Centers for Disease Control and Prevention). You can find more   support from smokefree.gov and other websites.  This information is not intended to replace advice given to you by your health care provider. Make sure you discuss any questions you have with your health care provider.  Document Released: 08/11/2009 Document Revised: 06/12/2016 Document Reviewed: 03/01/2015  Elsevier Interactive Patient Education © 2019 Elsevier Inc.

## 2018-12-25 ENCOUNTER — Ambulatory Visit: Payer: Self-pay | Admitting: Orthopaedic Surgery

## 2018-12-25 ENCOUNTER — Encounter: Payer: Self-pay | Admitting: Orthopaedic Surgery

## 2018-12-25 VITALS — BP 134/76 | HR 77 | Ht 64.0 in | Wt 172.0 lb

## 2018-12-25 DIAGNOSIS — G8929 Other chronic pain: Secondary | ICD-10-CM

## 2018-12-25 DIAGNOSIS — M2550 Pain in unspecified joint: Secondary | ICD-10-CM

## 2018-12-25 DIAGNOSIS — F17210 Nicotine dependence, cigarettes, uncomplicated: Secondary | ICD-10-CM

## 2018-12-25 DIAGNOSIS — M545 Low back pain, unspecified: Secondary | ICD-10-CM

## 2018-12-25 MED ORDER — HYDROCODONE-ACETAMINOPHEN 7.5-325 MG PO TABS: 1.0000 | ORAL_TABLET | Freq: Four times a day (QID) | ORAL | 0 refills | Status: DC | PRN

## 2018-12-25 NOTE — Progress Notes (Signed)
Patient Judith Rodriguez, female DOB:Jan 25, 1966, 53 y.o. [id number]  Chief Complaint  Patient presents with  . Back Pain    HPI  Judith Rodriguez is a 53 y.o. female who has multiple joint pains and back pain.  She has been rolling paint and has more pain in the right shoulder today.  She has no weakness to the back. She is active and taking her medicine.   Body mass index is 29.52 kg/m.  ROS  Review of Systems  Constitutional:       Patient does not have Diabetes Mellitus. Patient does not have hypertension. Patient does not have COPD or shortness of breath. Patient does not have BMI > 35. Patient has current smoking history.  HENT: Negative for congestion.   Respiratory: Negative for cough and shortness of breath.   Cardiovascular: Negative for chest pain and leg swelling.  Endocrine: Negative for cold intolerance.  Musculoskeletal: Positive for arthralgias and back pain.  Allergic/Immunologic: Positive for environmental allergies.  All other systems reviewed and are negative.   All other systems reviewed and are negative.  The following is a summary of the past history medically, past history surgically, known current medicines, social history and family history.  This information is gathered electronically by the computer from prior information and documentation.  I review this each visit and have found including this information at this point in the chart is beneficial and informative.    Past Medical History:  Diagnosis Date  . Arthritis   . Bulging lumbar disc    pushing on sciatic nerve L4  . Carpal tunnel syndrome   . Depression 12/01/2015  . Dyslipidemia 12/05/2015   Will try diet and exercise first  . Hot flashes 12/01/2015  . Vitamin D deficiency 12/05/2015   Take vitamin D3 5000 iu daily    Past Surgical History:  Procedure Laterality Date  . ABDOMINAL HYSTERECTOMY    . CESAREAN SECTION    . COLONOSCOPY WITH PROPOFOL N/A 03/06/2018   Procedure: COLONOSCOPY WITH  PROPOFOL;  Surgeon: Daneil Dolin, MD;  [facility];  Service: Endoscopy;  Laterality: N/A;  1:30pm  . POLYPECTOMY  03/06/2018   Procedure: POLYPECTOMY;  Surgeon: Daneil Dolin, MD;  [facility];  Service: Endoscopy;;  cecal polyp cs, hepatic flexure polyps  . SALPINGOOPHORECTOMY Left     Family History  Problem Relation Age of Onset  . Cancer Mother        lung  . Cancer Father        lung  . Hypertension Father   . Hypertension Brother   . Aneurysm Paternal Grandmother        brain  . Heart disease Paternal Grandfather        heart attack  . Hypertension Sister   . Colon cancer Paternal Aunt        less than age 12    Social History Social History   Tobacco Use  . Smoking status: Current Every Day Smoker    Packs/day: 0.25    Years: 33.00    Pack years: 8.25    Types: Cigarettes  . Smokeless tobacco: Never Used  Substance Use Topics  . Alcohol use: No  . Drug use: No    Allergies  Allergen Reactions  . Aspirin Hives    Current Outpatient Medications  Medication Sig Dispense Refill  . Cholecalciferol (VITAMIN D-3) 5000 units TABS Take 1 tablet by mouth daily.     . citalopram (CELEXA) 20 MG  tablet Take 1 tablet (20 mg total) by mouth daily. 30 tablet 12  . diphenhydrAMINE (BENADRYL) 25 mg capsule Take 25 mg by mouth daily as needed for allergies.     Marland Kitchen HYDROcodone-acetaminophen (NORCO) 7.5-325 MG tablet Take 1 tablet by mouth every 6 (six) hours as needed for moderate pain. 70 tablet 0  . hydrocortisone (ANUSOL-HC) 2.5 % rectal cream Place 1 application rectally 2 (two) times daily. 30 g 0  . Menthol, Topical Analgesic, (BENGAY EX) Apply 1 application topically daily as needed (muscle pain).    . predniSONE (DELTASONE) 5 MG tablet 4 pills a day for a week, then 3 pills a day for a week, then 2 pills a day for a week, then 1 pill a day for a week, then 1/2 a pill until finished 75 tablet 0   No current facility-administered medications  for this visit.      Physical Exam  Blood pressure 134/76, pulse 77, height 5\' 4"  (1.626 m), weight 172 lb (78 kg).  Constitutional: overall normal hygiene, normal nutrition, well developed, normal grooming, normal body habitus. Assistive device:none  Musculoskeletal: gait and station Limp none, muscle tone and strength are normal, no tremors or atrophy is present.  .  Neurological: coordination overall normal.  Deep tendon reflex/nerve stretch intact.  Sensation normal.  Cranial nerves II-XII intact.   Skin:   Normal overall no scars, lesions, ulcers or rashes. No psoriasis.  Psychiatric: Alert and oriented x 3.  Recent memory intact, remote memory unclear.  Normal mood and affect. Well groomed.  Good eye contact.  Cardiovascular: overall no swelling, no varicosities, no edema bilaterally, normal temperatures of the legs and arms, no clubbing, cyanosis and good capillary refill.  Lymphatic: palpation is normal.  Spine/Pelvis examination:  Inspection:  Overall, sacoiliac joint benign and hips nontender; without crepitus or defects.   Thoracic spine inspection: Alignment normal without kyphosis present   Lumbar spine inspection:  Alignment  with normal lumbar lordosis, without scoliosis apparent.   Thoracic spine palpation:  without tenderness of spinal processes   Lumbar spine palpation: without tenderness of lumbar area; without tightness of lumbar muscles    Range of Motion:   Lumbar flexion, forward flexion is normal without pain or tenderness    Lumbar extension is full without pain or tenderness   Left lateral bend is normal without pain or tenderness   Right lateral bend is normal without pain or tenderness   Straight leg raising is normal  Strength & tone: normal   Stability overall normal stability  Right shoulder is tender, ROM forward 120, abduction 90, internal 35, external 35, extension 10, adduction full.  NV intact. All other systems reviewed and are  negative   The patient has been educated about the nature of the problem(s) and counseled on treatment options.  The patient appeared to understand what I have discussed and is in agreement with it.  Encounter Diagnoses  Name Primary?  . Chronic pain of multiple joints Yes  . Chronic midline low back pain without sciatica   . Cigarette nicotine dependence without complication     PLAN Call if any problems.  Precautions discussed.  Continue current medications.   Return to clinic 6 weeks   I have reviewed the [facility] web site prior to prescribing narcotic medicine for this patient.   Electronically Signed Sanjuana Kava, MD 2/27/20209:08 AM

## 2018-12-25 NOTE — Patient Instructions (Signed)
Steps to Quit Smoking    Smoking tobacco can be bad for your health. It can also affect almost every organ in your body. Smoking puts you and people around you at risk for many serious long-lasting (chronic) diseases. Quitting smoking is hard, but it is one of the best things that you can do for your health. It is never too late to quit.  What are the benefits of quitting smoking?  When you quit smoking, you lower your risk for getting serious diseases and conditions. They can include:  · Lung cancer or lung disease.  · Heart disease.  · Stroke.  · Heart attack.  · Not being able to have children (infertility).  · Weak bones (osteoporosis) and broken bones (fractures).  If you have coughing, wheezing, and shortness of breath, those symptoms may get better when you quit. You may also get sick less often. If you are pregnant, quitting smoking can help to lower your chances of having a baby of low birth weight.  What can I do to help me quit smoking?  Talk with your doctor about what can help you quit smoking. Some things you can do (strategies) include:  · Quitting smoking totally, instead of slowly cutting back how much you smoke over a period of time.  · Going to in-person counseling. You are more likely to quit if you go to many counseling sessions.  · Using resources and support systems, such as:  ? Online chats with a counselor.  ? Phone quitlines.  ? Printed self-help materials.  ? Support groups or group counseling.  ? Text messaging programs.  ? Mobile phone apps or applications.  · Taking medicines. Some of these medicines may have nicotine in them. If you are pregnant or breastfeeding, do not take any medicines to quit smoking unless your doctor says it is okay. Talk with your doctor about counseling or other things that can help you.  Talk with your doctor about using more than one strategy at the same time, such as taking medicines while you are also going to in-person counseling. This can help make  quitting easier.  What things can I do to make it easier to quit?  Quitting smoking might feel very hard at first, but there is a lot that you can do to make it easier. Take these steps:  · Talk to your family and friends. Ask them to support and encourage you.  · Call phone quitlines, reach out to support groups, or work with a counselor.  · Ask people who smoke to not smoke around you.  · Avoid places that make you want (trigger) to smoke, such as:  ? Bars.  ? Parties.  ? Smoke-break areas at work.  · Spend time with people who do not smoke.  · Lower the stress in your life. Stress can make you want to smoke. Try these things to help your stress:  ? Getting regular exercise.  ? Deep-breathing exercises.  ? Yoga.  ? Meditating.  ? Doing a body scan. To do this, close your eyes, focus on one area of your body at a time from head to toe, and notice which parts of your body are tense. Try to relax the muscles in those areas.  · Download or buy apps on your mobile phone or tablet that can help you stick to your quit plan. There are many free apps, such as QuitGuide from the CDC (Centers for Disease Control and Prevention). You can find more   support from smokefree.gov and other websites.  This information is not intended to replace advice given to you by your health care provider. Make sure you discuss any questions you have with your health care provider.  Document Released: 08/11/2009 Document Revised: 06/12/2016 Document Reviewed: 03/01/2015  Elsevier Interactive Patient Education © 2019 Elsevier Inc.

## 2019-01-21 ENCOUNTER — Telehealth: Payer: Self-pay | Admitting: Orthopaedic Surgery

## 2019-01-21 MED ORDER — HYDROCODONE-ACETAMINOPHEN 7.5-325 MG PO TABS: 1.0000 | ORAL_TABLET | Freq: Four times a day (QID) | ORAL | 0 refills | Status: DC | PRN

## 2019-01-21 NOTE — Telephone Encounter (Signed)
Hydrocodone-Acetaminophen  7.5/325 mg  Qty 70 Tablets  PATIENT USES [facility] IN [place]

## 2019-02-05 ENCOUNTER — Ambulatory Visit: Payer: Self-pay | Admitting: Orthopaedic Surgery

## 2019-02-18 ENCOUNTER — Telehealth: Payer: Self-pay | Admitting: Orthopaedic Surgery

## 2019-02-18 MED ORDER — HYDROCODONE-ACETAMINOPHEN 7.5-325 MG PO TABS: 1.0000 | ORAL_TABLET | Freq: Four times a day (QID) | ORAL | 0 refills | Status: DC | PRN

## 2019-02-18 NOTE — Telephone Encounter (Signed)
Hydrocodone-Acetaminophen 7.5/325 mg  Qty 65 Tablets  PATIENT USES [facility] IN [place]

## 2019-03-05 ENCOUNTER — Encounter: Payer: Self-pay | Admitting: Internal Medicine

## 2019-03-10 ENCOUNTER — Ambulatory Visit: Payer: Self-pay | Admitting: Orthopaedic Surgery

## 2019-03-12 ENCOUNTER — Other Ambulatory Visit: Payer: Self-pay

## 2019-03-12 ENCOUNTER — Ambulatory Visit: Payer: Self-pay | Admitting: Orthopaedic Surgery

## 2019-03-12 ENCOUNTER — Encounter: Payer: Self-pay | Admitting: Orthopaedic Surgery

## 2019-03-12 VITALS — BP 139/76 | HR 81 | Temp 97.2°F | Ht 64.0 in | Wt 171.0 lb

## 2019-03-12 DIAGNOSIS — M545 Low back pain: Secondary | ICD-10-CM

## 2019-03-12 DIAGNOSIS — G8929 Other chronic pain: Secondary | ICD-10-CM

## 2019-03-12 DIAGNOSIS — M2550 Pain in unspecified joint: Secondary | ICD-10-CM

## 2019-03-12 DIAGNOSIS — F17210 Nicotine dependence, cigarettes, uncomplicated: Secondary | ICD-10-CM

## 2019-03-12 MED ORDER — PREDNISONE 5 MG (21) PO TBPK
ORAL_TABLET | ORAL | 0 refills | Status: DC
Start: 2019-03-12 — End: 2019-05-13

## 2019-03-12 NOTE — Progress Notes (Signed)
Patient ZO:XWRU Rodriguez, female DOB:1965/12/07, 53 y.o. [id number]  Chief Complaint  Patient presents with  . Back Pain    Chronic back pain.    HPI  Judith Rodriguez is a 53 y.o. female who has lower back pain chronic and multiple arthralgias.  She says the cooler weather has made her worse. She has no new trauma, no swelling, no redness. She is taking her pain medicine.     Body mass index is 29.35 kg/m.  ROS  Review of Systems  Constitutional:       Patient does not have Diabetes Mellitus. Patient does not have hypertension. Patient does not have COPD or shortness of breath. Patient does not have BMI > 35. Patient has current smoking history.  HENT: Negative for congestion.   Respiratory: Negative for cough and shortness of breath.   Cardiovascular: Negative for chest pain and leg swelling.  Endocrine: Negative for cold intolerance.  Musculoskeletal: Positive for arthralgias and back pain.  Allergic/Immunologic: Positive for environmental allergies.  All other systems reviewed and are negative.   All other systems reviewed and are negative.  The following is a summary of the past history medically, past history surgically, known current medicines, social history and family history.  This information is gathered electronically by the computer from prior information and documentation.  I review this each visit and have found including this information at this point in the chart is beneficial and informative.    Past Medical History:  Diagnosis Date  . Arthritis   . Bulging lumbar disc    pushing on sciatic nerve L4  . Carpal tunnel syndrome   . Depression 12/01/2015  . Dyslipidemia 12/05/2015   Will try diet and exercise first  . Hot flashes 12/01/2015  . Vitamin D deficiency 12/05/2015   Take vitamin D3 5000 iu daily    Past Surgical History:  Procedure Laterality Date  . ABDOMINAL HYSTERECTOMY    . CESAREAN SECTION    . COLONOSCOPY WITH PROPOFOL N/A 03/06/2018   Procedure:  COLONOSCOPY WITH PROPOFOL;  Surgeon: Daneil Dolin, MD;  [facility];  Service: Endoscopy;  Laterality: N/A;  1:30pm  . POLYPECTOMY  03/06/2018   Procedure: POLYPECTOMY;  Surgeon: Daneil Dolin, MD;  [facility];  Service: Endoscopy;;  cecal polyp cs, hepatic flexure polyps  . SALPINGOOPHORECTOMY Left     Family History  Problem Relation Age of Onset  . Cancer Mother        lung  . Cancer Father        lung  . Hypertension Father   . Hypertension Brother   . Aneurysm Paternal Grandmother        brain  . Heart disease Paternal Grandfather        heart attack  . Hypertension Sister   . Colon cancer Paternal Aunt        less than age 27    Social History Social History   Tobacco Use  . Smoking status: Current Every Day Smoker    Packs/day: 0.25    Years: 33.00    Pack years: 8.25    Types: Cigarettes  . Smokeless tobacco: Never Used  Substance Use Topics  . Alcohol use: No  . Drug use: No    Allergies  Allergen Reactions  . Aspirin Hives    Current Outpatient Medications  Medication Sig Dispense Refill  . Cholecalciferol (VITAMIN D-3) 5000 units TABS Take 1 tablet by mouth daily.     Marland Kitchen  citalopram (CELEXA) 20 MG tablet Take 1 tablet (20 mg total) by mouth daily. 30 tablet 12  . diphenhydrAMINE (BENADRYL) 25 mg capsule Take 25 mg by mouth daily as needed for allergies.     Marland Kitchen HYDROcodone-acetaminophen (NORCO) 7.5-325 MG tablet Take 1 tablet by mouth every 6 (six) hours as needed for moderate pain. 65 tablet 0  . hydrocortisone (ANUSOL-HC) 2.5 % rectal cream Place 1 application rectally 2 (two) times daily. 30 g 0  . Menthol, Topical Analgesic, (BENGAY EX) Apply 1 application topically daily as needed (muscle pain).    . predniSONE (STERAPRED UNI-PAK 21 TAB) 5 MG (21) TBPK tablet Take 6 pills first day; 5 pills second day; 4 pills third day; 3 pills fourth day; 2 pills next day and 1 pill last day. 21 tablet 0   No current facility-administered  medications for this visit.      Physical Exam  Blood pressure 139/76, pulse 81, temperature (!) 97.2 F (36.2 C), height 5\' 4"  (1.626 m), weight 171 lb (77.6 kg).  Constitutional: overall normal hygiene, normal nutrition, well developed, normal grooming, normal body habitus. Assistive device:none  Musculoskeletal: gait and station Limp none, muscle tone and strength are normal, no tremors or atrophy is present.  .  Neurological: coordination overall normal.  Deep tendon reflex/nerve stretch intact.  Sensation normal.  Cranial nerves II-XII intact.   Skin:   Normal overall no scars, lesions, ulcers or rashes. No psoriasis.  Psychiatric: Alert and oriented x 3.  Recent memory intact, remote memory unclear.  Normal mood and affect. Well groomed.  Good eye contact.  Cardiovascular: overall no swelling, no varicosities, no edema bilaterally, normal temperatures of the legs and arms, no clubbing, cyanosis and good capillary refill.  Lymphatic: palpation is normal.  Spine/Pelvis examination:  Inspection:  Overall, sacoiliac joint benign and hips nontender; without crepitus or defects.   Thoracic spine inspection: Alignment normal without kyphosis present   Lumbar spine inspection:  Alignment  with normal lumbar lordosis, without scoliosis apparent.   Thoracic spine palpation:  without tenderness of spinal processes   Lumbar spine palpation: without tenderness of lumbar area; without tightness of lumbar muscles    Range of Motion:   Lumbar flexion, forward flexion is normal without pain or tenderness    Lumbar extension is full without pain or tenderness   Left lateral bend is normal without pain or tenderness   Right lateral bend is normal without pain or tenderness   Straight leg raising is normal  Strength & tone: normal   Stability overall normal stability  All other systems reviewed and are negative   The patient has been educated about the nature of the problem(s) and  counseled on treatment options.  The patient appeared to understand what I have discussed and is in agreement with it.  Encounter Diagnoses  Name Primary?  . Chronic pain of multiple joints Yes  . Chronic midline low back pain without sciatica   . Cigarette nicotine dependence without complication     PLAN Call if any problems.  Precautions discussed.  Continue current medications. I did call in prednisone dose pack.  Return to clinic 3 months   Electronically Signed Sanjuana Kava, MD 5/14/20208:47 AM

## 2019-03-19 ENCOUNTER — Telehealth: Payer: Self-pay | Admitting: Orthopaedic Surgery

## 2019-03-19 MED ORDER — HYDROCODONE-ACETAMINOPHEN 7.5-325 MG PO TABS: 1.0000 | ORAL_TABLET | Freq: Four times a day (QID) | ORAL | 0 refills | Status: DC | PRN

## 2019-03-19 NOTE — Telephone Encounter (Signed)
Patient called for refill: HYDROcodone-acetaminophen (NORCO) 7.5-325 MG tablet 65 tablet  -[place]

## 2019-04-15 ENCOUNTER — Telehealth: Payer: Self-pay | Admitting: Orthopaedic Surgery

## 2019-04-15 MED ORDER — HYDROCODONE-ACETAMINOPHEN 7.5-325 MG PO TABS: 1.0000 | ORAL_TABLET | Freq: Four times a day (QID) | ORAL | 0 refills | Status: DC | PRN

## 2019-04-15 NOTE — Telephone Encounter (Signed)
Judith Rodriguez called and stated that her prescription refill is due on Sunday, 04/19/19 but that Dr. Luna Glasgow will only be in the office on Thursday morning.  She requests refill on Hydrocodone/Acetaminophen 7.5-325  Mgs.  Qty  65  Sig: Take 1 tablet by mouth every 6 (six) hours as needed for moderate pain.  Patient states she uses [facility] in [place]

## 2019-05-13 ENCOUNTER — Telehealth: Payer: Self-pay

## 2019-05-13 MED ORDER — PREDNISONE 5 MG (21) PO TBPK: ORAL_TABLET | ORAL | 0 refills | Status: DC

## 2019-05-13 MED ORDER — HYDROCODONE-ACETAMINOPHEN 7.5-325 MG PO TABS: 1.0000 | ORAL_TABLET | Freq: Four times a day (QID) | ORAL | 0 refills | Status: DC | PRN

## 2019-05-13 NOTE — Telephone Encounter (Signed)
Hydrocodone-Acetaminophen 7.5/325 mg  Qty 65 Tablets  PATIENT Judith Rodriguez  She is asking also if you could put her on three times a day instead of two. States the pain is waking her up during the night and she can't sleep. Also asking if you could prescribe her some more Prednisone.

## 2019-06-11 ENCOUNTER — Ambulatory Visit: Payer: Self-pay | Admitting: Orthopaedic Surgery

## 2019-06-11 ENCOUNTER — Telehealth: Payer: Self-pay | Admitting: Orthopaedic Surgery

## 2019-06-11 MED ORDER — HYDROCODONE-ACETAMINOPHEN 7.5-325 MG PO TABS: 1.0000 | ORAL_TABLET | Freq: Four times a day (QID) | ORAL | 0 refills | Status: DC | PRN

## 2019-06-11 NOTE — Telephone Encounter (Signed)
Patient requests refill on Hydrocodone/Acetaminophen 7.5-325   Mgs.  Qty  90  Sig: Take 1 tablet by mouth every 6 (six) hours as needed for moderate pain.  Patient states she uses [facility] in [place]

## 2019-06-16 NOTE — Patient Instructions (Signed)

## 2019-06-17 ENCOUNTER — Ambulatory Visit (INDEPENDENT_AMBULATORY_CARE_PROVIDER_SITE_OTHER): Payer: Self-pay | Admitting: Orthopaedic Surgery

## 2019-06-17 ENCOUNTER — Encounter: Payer: Self-pay | Admitting: Orthopaedic Surgery

## 2019-06-17 ENCOUNTER — Other Ambulatory Visit: Payer: Self-pay

## 2019-06-17 VITALS — BP 140/75 | HR 82 | Ht 64.0 in | Wt 172.0 lb

## 2019-06-17 DIAGNOSIS — G8929 Other chronic pain: Secondary | ICD-10-CM

## 2019-06-17 DIAGNOSIS — M545 Low back pain, unspecified: Secondary | ICD-10-CM

## 2019-06-17 NOTE — Progress Notes (Signed)
Patient Judith Rodriguez, female DOB:May 04, 1966, 53 y.o. [id number]  Chief Complaint  Patient presents with  . Back Pain    the same/more pain    HPI  Judith Rodriguez is a 53 y.o. female who has chronic lower back pain.  She has had better days this summer but still has pain in the mid back at times.  She has no weakness. She has no new trauma.  She has had some pain of the left dorsal wrist more in the mornings.  She uses a pain cream and that helps.   Body mass index is 29.52 kg/m.  ROS  Review of Systems  Constitutional:       Patient does not have Diabetes Mellitus. Patient does not have hypertension. Patient does not have COPD or shortness of breath. Patient does not have BMI > 35. Patient has current smoking history.  HENT: Negative for congestion.   Respiratory: Negative for cough and shortness of breath.   Cardiovascular: Negative for chest pain and leg swelling.  Endocrine: Negative for cold intolerance.  Musculoskeletal: Positive for arthralgias and back pain.  Allergic/Immunologic: Positive for environmental allergies.  All other systems reviewed and are negative.   All other systems reviewed and are negative.  The following is a summary of the past history medically, past history surgically, known current medicines, social history and family history.  This information is gathered electronically by the computer from prior information and documentation.  I review this each visit and have found including this information at this point in the chart is beneficial and informative.    Past Medical History:  Diagnosis Date  . Arthritis   . Bulging lumbar disc    pushing on sciatic nerve L4  . Carpal tunnel syndrome   . Depression 12/01/2015  . Dyslipidemia 12/05/2015   Will try diet and exercise first  . Hot flashes 12/01/2015  . Vitamin D deficiency 12/05/2015   Take vitamin D3 5000 iu daily    Past Surgical History:  Procedure Laterality Date  . ABDOMINAL HYSTERECTOMY     . CESAREAN SECTION    . COLONOSCOPY WITH PROPOFOL N/A 03/06/2018   Procedure: COLONOSCOPY WITH PROPOFOL;  Surgeon: Daneil Dolin, MD;  [facility];  Service: Endoscopy;  Laterality: N/A;  1:30pm  . POLYPECTOMY  03/06/2018   Procedure: POLYPECTOMY;  Surgeon: Daneil Dolin, MD;  [facility];  Service: Endoscopy;;  cecal polyp cs, hepatic flexure polyps  . SALPINGOOPHORECTOMY Left     Family History  Problem Relation Age of Onset  . Cancer Mother        lung  . Cancer Father        lung  . Hypertension Father   . Hypertension Brother   . Aneurysm Paternal Grandmother        brain  . Heart disease Paternal Grandfather        heart attack  . Hypertension Sister   . Colon cancer Paternal Aunt        less than age 53    Social History Social History   Tobacco Use  . Smoking status: Current Every Day Smoker    Packs/day: 0.25    Years: 33.00    Pack years: 8.25    Types: Cigarettes  . Smokeless tobacco: Never Used  Substance Use Topics  . Alcohol use: No  . Drug use: No    Allergies  Allergen Reactions  . Aspirin Hives    Current Outpatient Medications  Medication  Sig Dispense Refill  . Cholecalciferol (VITAMIN D-3) 5000 units TABS Take 1 tablet by mouth daily.     . citalopram (CELEXA) 20 MG tablet Take 1 tablet (20 mg total) by mouth daily. 30 tablet 12  . diphenhydrAMINE (BENADRYL) 25 mg capsule Take 25 mg by mouth daily as needed for allergies.     Marland Kitchen HYDROcodone-acetaminophen (NORCO) 7.5-325 MG tablet Take 1 tablet by mouth every 6 (six) hours as needed for moderate pain. 90 tablet 0  . hydrocortisone (ANUSOL-HC) 2.5 % rectal cream Place 1 application rectally 2 (two) times daily. 30 g 0  . Menthol, Topical Analgesic, (BENGAY EX) Apply 1 application topically daily as needed (muscle pain).    . predniSONE (STERAPRED UNI-PAK 21 TAB) 5 MG (21) TBPK tablet Take 6 pills first day; 5 pills second day; 4 pills third day; 3 pills fourth day; 2  pills next day and 1 pill last day. 21 tablet 0   No current facility-administered medications for this visit.      Physical Exam  Blood pressure 140/75, pulse 82, height 5\' 4"  (1.626 m), weight 172 lb (78 kg).  Constitutional: overall normal hygiene, normal nutrition, well developed, normal grooming, normal body habitus. Assistive device:none  Musculoskeletal: gait and station Limp none, muscle tone and strength are normal, no tremors or atrophy is present.  .  Neurological: coordination overall normal.  Deep tendon reflex/nerve stretch intact.  Sensation normal.  Cranial nerves II-XII intact.   Skin:   Normal overall no scars, lesions, ulcers or rashes. No psoriasis.  Psychiatric: Alert and oriented x 3.  Recent memory intact, remote memory unclear.  Normal mood and affect. Well groomed.  Good eye contact.  Cardiovascular: overall no swelling, no varicosities, no edema bilaterally, normal temperatures of the legs and arms, no clubbing, cyanosis and good capillary refill.  Spine/Pelvis examination:  Inspection:  Overall, sacoiliac joint benign and hips nontender; without crepitus or defects.   Thoracic spine inspection: Alignment normal without kyphosis present   Lumbar spine inspection:  Alignment  with normal lumbar lordosis, without scoliosis apparent.   Thoracic spine palpation:  without tenderness of spinal processes   Lumbar spine palpation: without tenderness of lumbar area; without tightness of lumbar muscles    Range of Motion:   Lumbar flexion, forward flexion is normal without pain or tenderness    Lumbar extension is full without pain or tenderness   Left lateral bend is normal without pain or tenderness   Right lateral bend is normal without pain or tenderness   Straight leg raising is normal  Strength & tone: normal   Stability overall normal stability  Lymphatic: palpation is normal.  All other systems reviewed and are negative   The patient has been  educated about the nature of the problem(s) and counseled on treatment options.  The patient appeared to understand what I have discussed and is in agreement with it.  Encounter Diagnosis  Name Primary?  . Chronic midline low back pain without sciatica Yes    PLAN Call if any problems.  Precautions discussed.  Continue current medications.   Return to clinic 3 months   Electronically Signed Sanjuana Kava, MD 8/19/20209:05 AM

## 2019-06-26 ENCOUNTER — Telehealth: Payer: Self-pay | Admitting: Orthopaedic Surgery

## 2019-06-26 NOTE — Telephone Encounter (Signed)
Patient of Dr. Brooke Bonito is requesting Predisone.    She uses [facility] in [place]

## 2019-06-29 MED ORDER — PREDNISONE 5 MG (21) PO TBPK: ORAL_TABLET | ORAL | 0 refills | Status: DC

## 2019-07-07 ENCOUNTER — Other Ambulatory Visit: Payer: Self-pay | Admitting: Radiology

## 2019-07-07 MED ORDER — HYDROCODONE-ACETAMINOPHEN 7.5-325 MG PO TABS: 1.0000 | ORAL_TABLET | Freq: Four times a day (QID) | ORAL | 0 refills | Status: DC | PRN

## 2019-07-07 NOTE — Telephone Encounter (Signed)
Patient called in and requested refill of hydrocodone.  [facility].

## 2019-07-08 ENCOUNTER — Other Ambulatory Visit: Payer: Self-pay | Admitting: Adult Health

## 2019-08-03 ENCOUNTER — Telehealth: Payer: Self-pay | Admitting: Orthopaedic Surgery

## 2019-08-03 NOTE — Telephone Encounter (Signed)
Patient requests refill on Hydrocodone/Acetaminophen 7.5-325  Mgs.  Qty  90  Sig: Take 1 tablet by mouth every 6 (six) hours as needed for moderate pain.  Patient states she uses [facility] in [place]

## 2019-08-04 MED ORDER — HYDROCODONE-ACETAMINOPHEN 7.5-325 MG PO TABS: 1.0000 | ORAL_TABLET | Freq: Four times a day (QID) | ORAL | 0 refills | Status: DC | PRN

## 2019-08-08 IMAGING — MR MR LUMBAR SPINE W/O CM
4 of 5 series · 18 of 48 positions shown · non-contrast
Comparison: Prior radiographs from 09/16/2017.

CLINICAL DATA: Initial evaluation for low back pain with left leg
pain for 2 years.

EXAM:
MRI LUMBAR SPINE WITHOUT CONTRAST
TECHNIQUE: Multiplanar, multisequence MR imaging of the lumbar spine was
performed. No intravenous contrast was administered.

[Series 6: T2 · sagittal · 4.0mm · 0.73mm/px · 6 of 17 slices shown (1 of 2)]
[im 1/17]
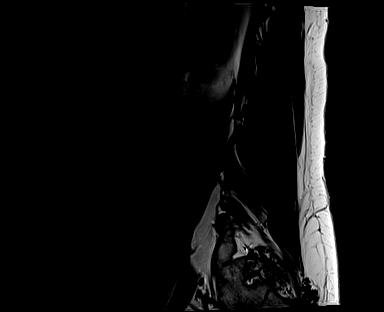
[im 4/17]
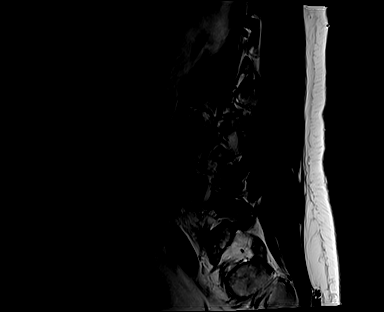
[im 7/17]
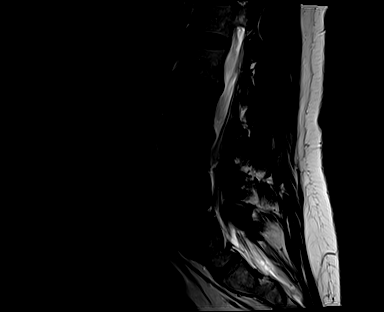
[im 10/17]
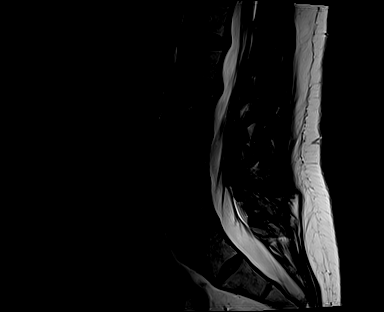
[im 13/17]
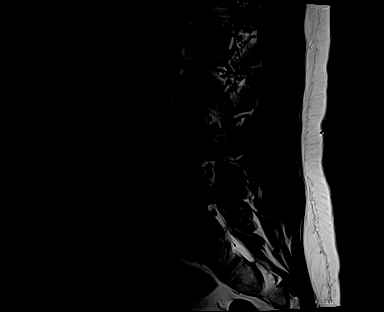
[im 17/17]
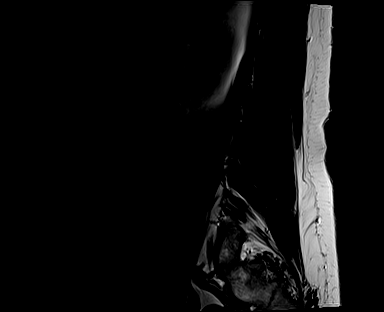

[Series 7: T1 · sagittal · 4.0mm · 0.73mm/px · 3 of 17 slices shown (1 of 2)]
[im 4/17]
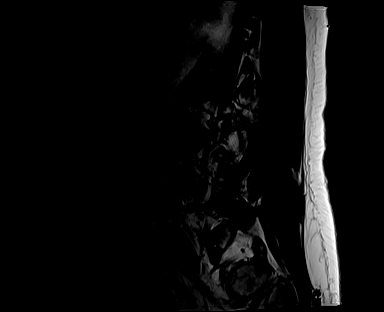
[im 10/17]
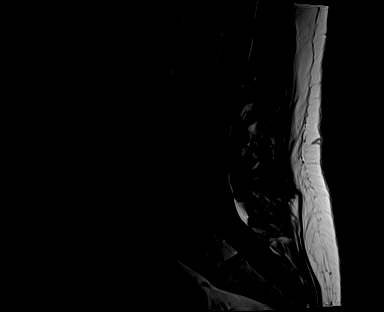
[im 17/17]
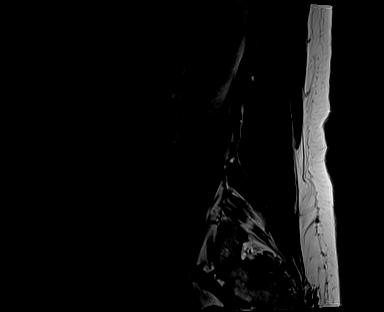

[Series 11: T1 · axial · 4.0mm · 0.28mm/px · z∈[-42,+121]mm · 3 of 39 slices shown (2 of 2)]
[im 6/39]
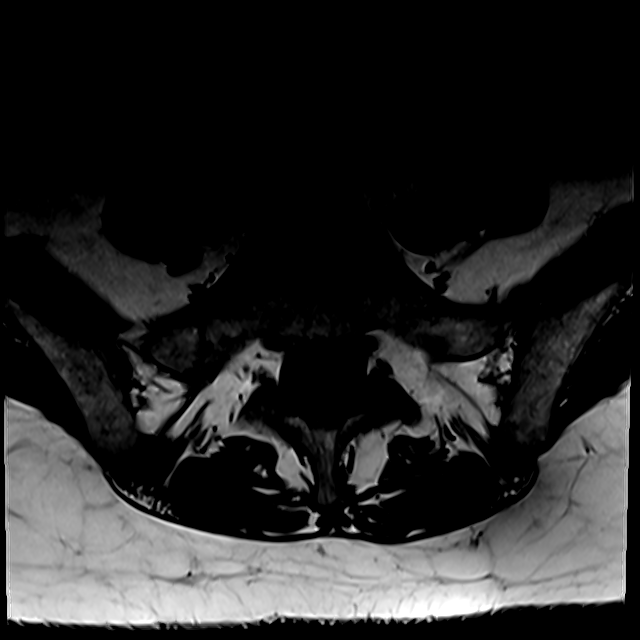
[im 20/39]
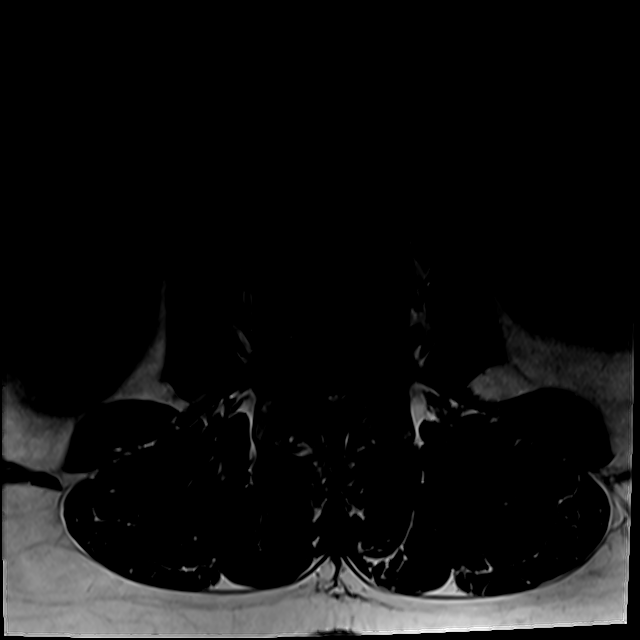
[im 33/39]
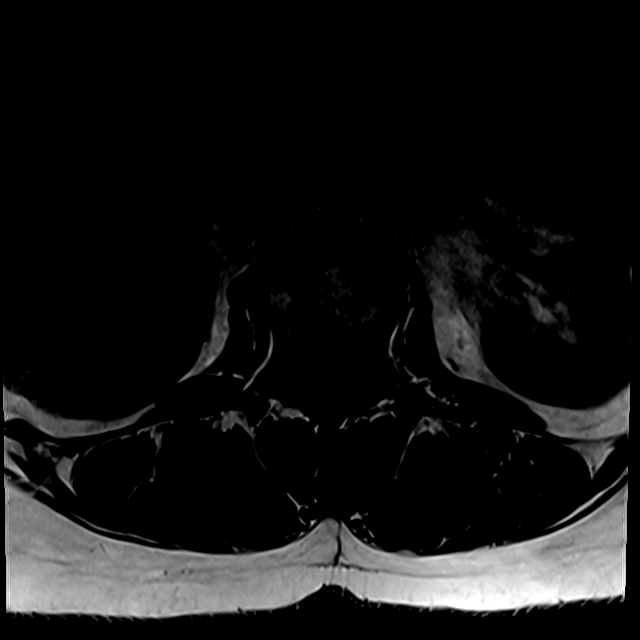

[Series 14: T2 · axial · 4.0mm · 0.28mm/px · z∈[-67,+121]mm · 6 of 39 slices shown (2 of 2)]
[im 1/39]
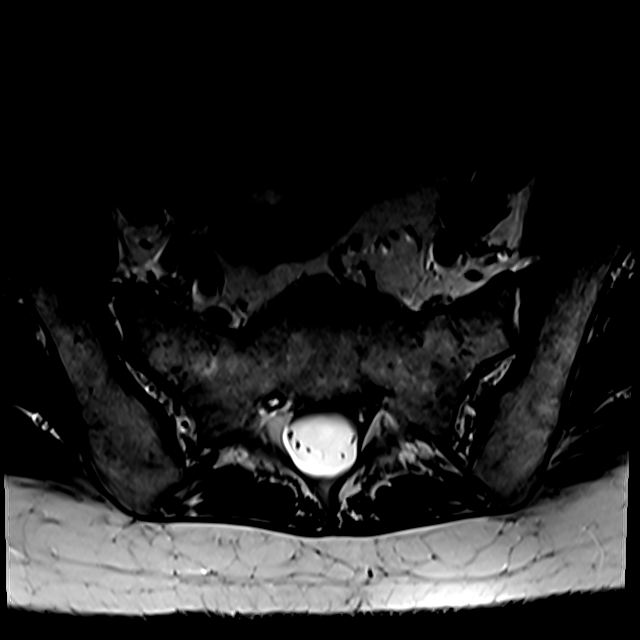
[im 6/39]
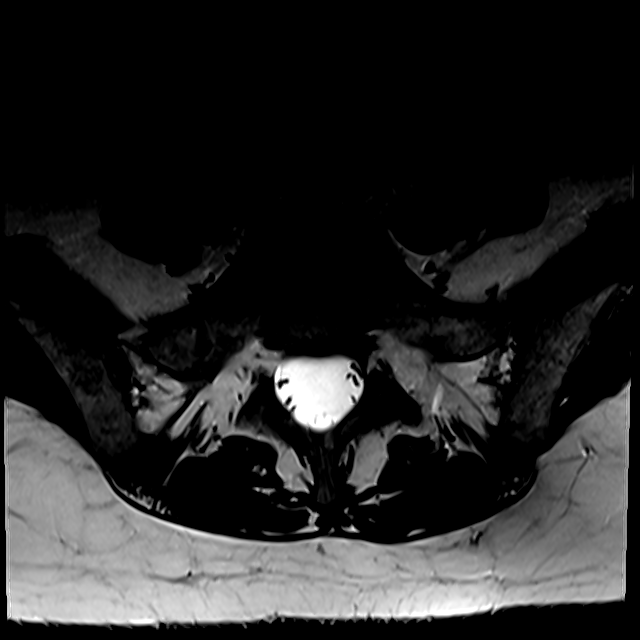
[im 11/39]
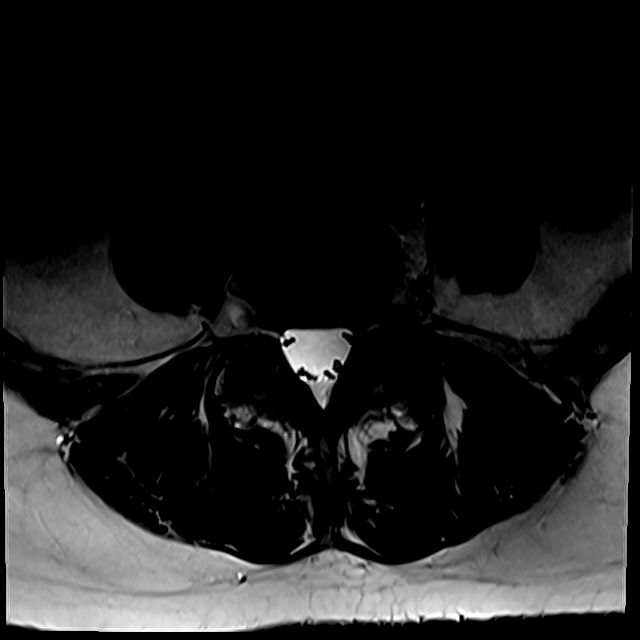
[im 17/39]
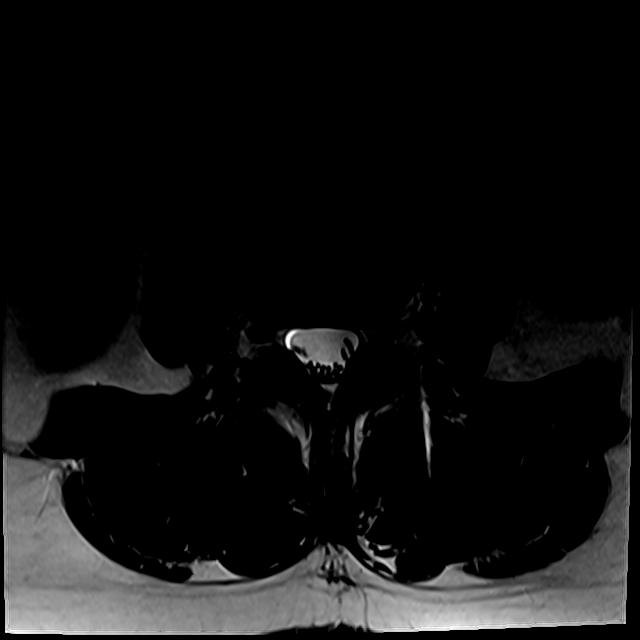
[im 20/39]
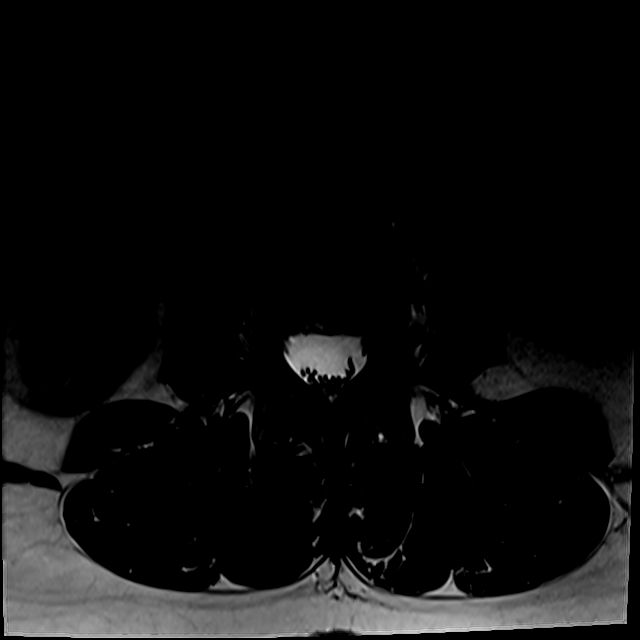
[im 33/39]
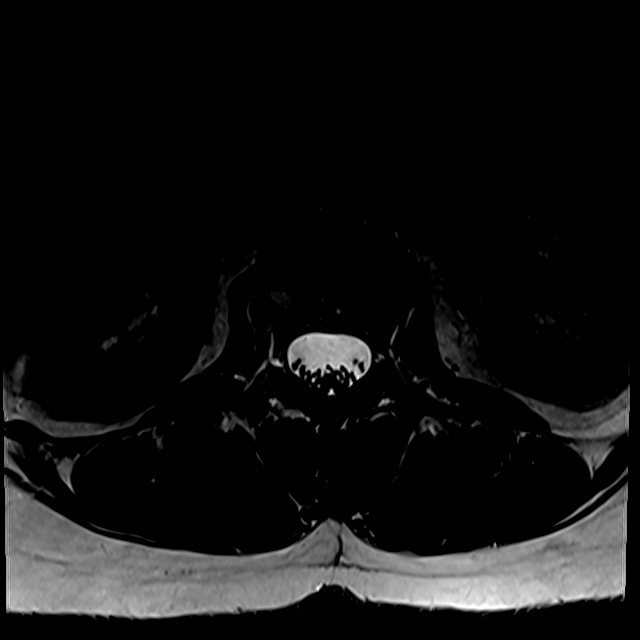

[18 of 48 positions shown; findings below may reference images not displayed]

FINDINGS: Segmentation: Transitional lumbosacral anatomy with partial
sacralization of the L5 vertebral body.

Alignment: Mild levoscoliosis. Vertebral bodies otherwise normally
aligned with preservation of the normal

Vertebrae: Vertebral body heights maintained without evidence for
acute or chronic fracture. Bone marrow signal intensity within
normal limits. Few small benign hemangiomas noted. No worrisome
osseous lesions. Reactive edema about the left L4-5 facet due to
facet degeneration (series 8, image 13).

Conus medullaris and cauda equina: Conus extends to the L1 Level.
Conus and cauda equina appear normal.

Paraspinal and other soft tissues: Paraspinous soft tissues within
normal limits. Visualized visceral structures within normal limits.

Disc levels:

T12-L1: Mild diffuse disc bulge with disc desiccation. Small right
paracentral disc protrusion indents the ventral thecal sac. No
significant stenosis.

L1-2: Diffuse disc bulge with disc desiccation. Small left
paracentral disc protrusion indents the left ventral thecal sac. No
stenosis or neural impingement.

L2-3: Mild diffuse disc bulge with disc desiccation. Superimposed
shallow left subarticular/ foraminal disc protrusion, mildly
indenting the left ventral thecal sac (series 14, image 17). Mild
facet ligament flavum hypertrophy. No significant canal or lateral
recess stenosis. Foramina remain patent. No impingement.

L3-4: Mild diffuse disc bulge with disc desiccation. Mild facet
ligamentum flavum hypertrophy. No stenosis or impingement.

L4-5: Disc desiccation with mild left left eccentric disc bulge.
Bulging disc closely approximates and contacts the exiting left L4
nerve root (series 7, image 13). Moderate facet arthrosis. Reactive
edema about the left L4-5 facet due to facet arthritis. No
significant canal stenosis. Mild left L4 foraminal narrowing.

L5-S1: Rudimentary disc due to transitional lumbosacral anatomy. No
stenosis.
IMPRESSION: 1. Mild left eccentric disc bulge at L4-5, contacting and
potentially irritating the exiting left L4 nerve root.
2. Additional small disc protrusions at T12-L1, L1-2, and L2-3 as
above without significant stenosis or frank neural impingement.
3. Reactive edema about the left L4-5 facet due to facet arthritis,
which could serve as a source for lower back pain.
4. Transitional lumbosacral anatomy.

## 2019-08-11 ENCOUNTER — Telehealth: Payer: Self-pay | Admitting: Orthopaedic Surgery

## 2019-08-11 MED ORDER — PREDNISONE 5 MG (21) PO TBPK: ORAL_TABLET | ORAL | 0 refills | Status: DC

## 2019-08-11 NOTE — Telephone Encounter (Signed)
Patient called to request medication, due to "hurting all over": predniSONE (STERAPRED UNI-PAK 21 TAB) 5 MG (21) TBPK tablet 21 tablet  -[place]   - aware of next scheduled appointnment, [place] office, 09/09/19

## 2019-08-20 ENCOUNTER — Telehealth: Payer: Self-pay

## 2019-08-20 NOTE — Telephone Encounter (Signed)
Hydrocodone-Acetaminophen 7.5/325mg  Qty 90 Tablets    PATIENT USES [facility] 

## 2019-08-24 MED ORDER — HYDROCODONE-ACETAMINOPHEN 7.5-325 MG PO TABS: 1.0000 | ORAL_TABLET | Freq: Four times a day (QID) | ORAL | 0 refills | Status: DC | PRN

## 2019-09-01 ENCOUNTER — Other Ambulatory Visit: Payer: Self-pay

## 2019-09-01 ENCOUNTER — Encounter: Payer: Self-pay | Admitting: Orthopaedic Surgery

## 2019-09-01 ENCOUNTER — Ambulatory Visit: Payer: Self-pay | Admitting: Orthopaedic Surgery

## 2019-09-01 VITALS — BP 141/60 | HR 88 | Ht 64.0 in | Wt 172.0 lb

## 2019-09-01 DIAGNOSIS — M25532 Pain in left wrist: Secondary | ICD-10-CM

## 2019-09-01 MED ORDER — PREDNISONE 5 MG (21) PO TBPK: ORAL_TABLET | ORAL | 0 refills | Status: DC

## 2019-09-01 NOTE — Progress Notes (Signed)
Patient [id number] Mccambridge, female DOB:1966/05/19, 53 y.o. [id number]  Chief Complaint  Patient presents with  . Back Pain    worse   . Wrist Pain    left    HPI  Judith Rodriguez is a 53 y.o. female who has pain of the left wrist. She has some dorsal swelling, no trauma, no redness. She has used a rub on it, heat and ice. She cannot take any NSAIDs.  She has no trauma.  She has a splint and uses a glove.  It hurts most of the time.     Body mass index is 29.52 kg/m.  ROS  Review of Systems  Constitutional:       Patient does not have Diabetes Mellitus. Patient does not have hypertension. Patient does not have COPD or shortness of breath. Patient does not have BMI > 35. Patient has current smoking history.  HENT: Negative for congestion.   Respiratory: Negative for cough and shortness of breath.   Cardiovascular: Negative for chest pain and leg swelling.  Endocrine: Negative for cold intolerance.  Musculoskeletal: Positive for arthralgias and back pain.  Allergic/Immunologic: Positive for environmental allergies.  All other systems reviewed and are negative.   All other systems reviewed and are negative.  The following is a summary of the past history medically, past history surgically, known current medicines, social history and family history.  This information is gathered electronically by the computer from prior information and documentation.  I review this each visit and have found including this information at this point in the chart is beneficial and informative.    Past Medical History:  Diagnosis Date  . Arthritis   . Bulging lumbar disc    pushing on sciatic nerve L4  . Carpal tunnel syndrome   . Depression 12/01/2015  . Dyslipidemia 12/05/2015   Will try diet and exercise first  . Hot flashes 12/01/2015  . Vitamin D deficiency 12/05/2015   Take vitamin D3 5000 iu daily    Past Surgical History:  Procedure Laterality Date  . ABDOMINAL HYSTERECTOMY    . CESAREAN  SECTION    . COLONOSCOPY WITH PROPOFOL N/A 03/06/2018   Procedure: COLONOSCOPY WITH PROPOFOL;  Surgeon: Daneil Dolin, MD;  [facility];  Service: Endoscopy;  Laterality: N/A;  1:30pm  . POLYPECTOMY  03/06/2018   Procedure: POLYPECTOMY;  Surgeon: Daneil Dolin, MD;  Location: [facility];  Service: Endoscopy;;  cecal polyp cs, hepatic flexure polyps  . SALPINGOOPHORECTOMY Left     Family History  Problem Relation Age of Onset  . Cancer Mother        lung  . Cancer Father        lung  . Hypertension Father   . Hypertension Brother   . Aneurysm Paternal Grandmother        brain  . Heart disease Paternal Grandfather        heart attack  . Hypertension Sister   . Colon cancer Paternal Aunt        less than age 56    Social History Social History   Tobacco Use  . Smoking status: Current Every Day Smoker    Packs/day: 0.25    Years: 33.00    Pack years: 8.25    Types: Cigarettes  . Smokeless tobacco: Never Used  Substance Use Topics  . Alcohol use: No  . Drug use: No    Allergies  Allergen Reactions  . Aspirin Hives    Current Outpatient  Medications  Medication Sig Dispense Refill  . Cholecalciferol (VITAMIN D-3) 5000 units TABS Take 1 tablet by mouth daily.     . citalopram (CELEXA) 20 MG tablet TAKE 1 TABLET(20 MG) BY MOUTH DAILY 30 tablet 2  . diphenhydrAMINE (BENADRYL) 25 mg capsule Take 25 mg by mouth daily as needed for allergies.     Marland Kitchen HYDROcodone-acetaminophen (NORCO) 7.5-325 MG tablet Take 1 tablet by mouth every 6 (six) hours as needed for moderate pain. 90 tablet 0  . hydrocortisone (ANUSOL-HC) 2.5 % rectal cream Place 1 application rectally 2 (two) times daily. 30 g 0  . Menthol, Topical Analgesic, (BENGAY EX) Apply 1 application topically daily as needed (muscle pain).    . predniSONE (STERAPRED UNI-PAK 21 TAB) 5 MG (21) TBPK tablet Take 6 pills first day; 5 pills second day; 4 pills third day; 3 pills fourth day; 2 pills next day and 1 pill  last day. 21 tablet 0   No current facility-administered medications for this visit.      Physical Exam  Blood pressure (!) 141/60, pulse 88, height 5\' 4"  (1.626 m), weight 172 lb (78 kg).  Constitutional: overall normal hygiene, normal nutrition, well developed, normal grooming, normal body habitus. Assistive device:a glove for the left hand and wrist  Musculoskeletal: gait and station Limp none, muscle tone and strength are normal, no tremors or atrophy is present. Left dorsal wrist has some slight diffuse swelling, no redness, ROM is full. Grips are normal. .  Neurological: coordination overall normal.  Deep tendon reflex/nerve stretch intact.  Sensation normal.  Cranial nerves II-XII intact.   Skin:   Normal overall no scars, lesions, ulcers or rashes. No psoriasis.  Psychiatric: Alert and oriented x 3.  Recent memory intact, remote memory unclear.  Normal mood and affect. Well groomed.  Good eye contact.  Cardiovascular: overall no swelling, no varicosities, no edema bilaterally, normal temperatures of the legs and arms, no clubbing, cyanosis and good capillary refill.  Lymphatic: palpation is normal.  All other systems reviewed and are negative   The patient has been educated about the nature of the problem(s) and counseled on treatment options.  The patient appeared to understand what I have discussed and is in agreement with it.  Encounter Diagnosis  Name Primary?  . Left wrist pain Yes    PLAN Call if any problems.  Precautions discussed.  Continue current medications. Begin prednisone dose pack again.   Return to clinic 2 weeks   Electronically [place], MD 11/3/20204:04 PM

## 2019-09-09 ENCOUNTER — Ambulatory Visit: Payer: Self-pay | Admitting: Orthopaedic Surgery

## 2019-09-14 ENCOUNTER — Telehealth: Payer: Self-pay | Admitting: Orthopaedic Surgery

## 2019-09-14 MED ORDER — HYDROCODONE-ACETAMINOPHEN 7.5-325 MG PO TABS: 1.0000 | ORAL_TABLET | Freq: Four times a day (QID) | ORAL | 0 refills | Status: DC | PRN

## 2019-09-14 NOTE — Telephone Encounter (Signed)
Patient requests refill on Hydrocodone/Acetaminophen 7.5-325   Mgs.  Qty  90 ° °Sig: Take 1 tablet by mouth every 6 (six) hours as needed for moderate pain. ° °Patient states she uses [facility] in [place] °

## 2019-09-23 ENCOUNTER — Ambulatory Visit: Payer: Self-pay | Admitting: Orthopaedic Surgery

## 2019-09-30 ENCOUNTER — Telehealth: Payer: Self-pay

## 2019-09-30 NOTE — Telephone Encounter (Signed)
Prednisone   PATIENT USES [facility]

## 2019-09-30 NOTE — Telephone Encounter (Signed)
Not due until next week.  Resubmit then.

## 2019-10-01 ENCOUNTER — Telehealth: Payer: Self-pay

## 2019-10-01 MED ORDER — PREDNISONE 5 MG (21) PO TBPK: ORAL_TABLET | ORAL | 0 refills | Status: DC

## 2019-10-01 NOTE — Telephone Encounter (Signed)
Patient is asking if you will send in another prescription for Prednisone for her. She is aware that her pain medication is not due until next week.  PATIENT USES Judith Rodriguez

## 2019-10-05 ENCOUNTER — Other Ambulatory Visit: Payer: Self-pay

## 2019-10-05 MED ORDER — HYDROCODONE-ACETAMINOPHEN 7.5-325 MG PO TABS: 1.0000 | ORAL_TABLET | Freq: Four times a day (QID) | ORAL | 0 refills | Status: DC | PRN

## 2019-10-05 NOTE — Telephone Encounter (Signed)
Hydrocodone-Acetaminophen 7.5/325 mg  Qty 90 Tablets  PATIENT USES [facility]

## 2019-10-14 ENCOUNTER — Telehealth: Payer: Self-pay | Admitting: *Deleted

## 2019-10-14 MED ORDER — CITALOPRAM HYDROBROMIDE 20 MG PO TABS: ORAL_TABLET | ORAL | 6 refills | Status: DC

## 2019-10-14 NOTE — Telephone Encounter (Signed)
Patient called requesting a refill on her Celexa.  Informed there was a note on the last refill that she needs an appointment for further refills as she has not been seen in our office since 2018. Patient states she has not made appointment because she has not had any problems and currently does not have any insurance.  Informed I would send the message to [facility] for review. Pt verbalized understanding.

## 2019-10-14 NOTE — Telephone Encounter (Signed)
Will refill celexa, pt has ?knot in groin, to come in Friday at 11 to be seen

## 2019-10-14 NOTE — Telephone Encounter (Signed)
Patient called requesting refill for Celexa.

## 2019-10-14 NOTE — Addendum Note (Signed)
Addended by: Derrek Monaco A on: 10/14/2019 01:32 PM   Modules accepted: Orders

## 2019-10-16 ENCOUNTER — Ambulatory Visit: Payer: Self-pay | Admitting: Adult Health

## 2019-10-19 ENCOUNTER — Ambulatory Visit: Payer: Self-pay | Admitting: Adult Health

## 2019-10-19 ENCOUNTER — Other Ambulatory Visit: Payer: Self-pay

## 2019-10-19 ENCOUNTER — Encounter: Payer: Self-pay | Admitting: Adult Health

## 2019-10-19 VITALS — BP 117/70 | HR 97 | Ht 64.0 in | Wt 175.0 lb

## 2019-10-19 DIAGNOSIS — N8189 Other female genital prolapse: Secondary | ICD-10-CM | POA: Insufficient documentation

## 2019-10-19 DIAGNOSIS — Z8739 Personal history of other diseases of the musculoskeletal system and connective tissue: Secondary | ICD-10-CM | POA: Insufficient documentation

## 2019-10-19 DIAGNOSIS — N816 Rectocele: Secondary | ICD-10-CM | POA: Insufficient documentation

## 2019-10-19 DIAGNOSIS — R1032 Left lower quadrant pain: Secondary | ICD-10-CM | POA: Insufficient documentation

## 2019-10-19 MED ORDER — GABAPENTIN 300 MG PO CAPS: 300.0000 mg | ORAL_CAPSULE | Freq: Three times a day (TID) | ORAL | 1 refills | Status: DC

## 2019-10-19 NOTE — Progress Notes (Signed)
  Subjective:     Patient ID: Judith Rodriguez, female   DOB: [birth year], 53 y.o.   MRN: [id number]  HPI Judith Rodriguez is a 53 year old white female, sp hysterectomy in complaining of pain in left side,?lump, for 10 days, no fever, or nausea and vomiting.  Has chronic back pain and sees Dr Luna Glasgow, has pain meds. She is self pay. PCP is [facility].  Review of Systems Has pain and ?lump in left side x 10 days Denies fever,nausea or vomiting.   Reviewed past medical,surgical, social and family history. Reviewed medications and allergies.     Objective:   Physical Exam BP 117/70 (BP Location: Left Arm, Patient Position: Sitting, Cuff Size: Normal)   Pulse 97   Ht 5\' 4"  (1.626 m)   Wt 175 lb (79.4 kg)   BMI 30.04 kg/m fall risk is low Skin warm and dry.Pelvic: external genitalia is normal in appearance no lesions,+tenderness in mons pubis down into left vulva, no bulge noted, vagina: has relaxation,urethra has no lesions or masses noted, cervix and uterus are absent,adnexa: no masses or tenderness noted. Bladder is non tender and no masses felt. +rectocele, No hernia in abdominal wall or masses felt, had Dr Glo Herring in for co exam. She is allergic to ASA, or would try NSAID, but will try gabapentin.     Assessment:     1. Left inguinal pain   2. Pelvic relaxation   3. Rectocele   4. History of back pain       Plan:    Try ice Rest Will try gabapentin Meds ordered this encounter  Medications  . gabapentin (NEURONTIN) 300 MG capsule    Sig: Take 1 capsule (300 mg total) by mouth 3 (three) times daily.    Dispense:  90 capsule    Refill:  1    Order Specific Question:   Supervising Provider    Answer:   Tania Ade H [[id number]]  Follow up in 3 weeks, if still hurts will get CT should have insurance then If pain increases go to ER

## 2019-10-19 NOTE — Patient Instructions (Signed)
If pain increases go to ER

## 2019-10-26 ENCOUNTER — Telehealth: Payer: Self-pay | Admitting: Orthopaedic Surgery

## 2019-10-26 NOTE — Telephone Encounter (Signed)
Too early.  Not due until 11-05-2019

## 2019-10-26 NOTE — Telephone Encounter (Signed)
Patient requests refill on Hydrocodone/Acetaminophen 7.5-325  Mgs.  QAty  90  Sig: Take 1 tablet by mouth every 6 (six) hours as needed for moderate pain.  Patient states she uses [facility] in [place]

## 2019-11-03 ENCOUNTER — Encounter: Payer: Self-pay | Admitting: Orthopaedic Surgery

## 2019-11-03 ENCOUNTER — Ambulatory Visit (INDEPENDENT_AMBULATORY_CARE_PROVIDER_SITE_OTHER): Payer: 59 | Admitting: Orthopaedic Surgery

## 2019-11-03 ENCOUNTER — Other Ambulatory Visit: Payer: Self-pay

## 2019-11-03 VITALS — BP 143/103 | HR 113 | Temp 97.5°F | Ht 64.0 in | Wt 175.5 lb

## 2019-11-03 DIAGNOSIS — M25532 Pain in left wrist: Secondary | ICD-10-CM | POA: Diagnosis not present

## 2019-11-03 DIAGNOSIS — M545 Low back pain: Secondary | ICD-10-CM

## 2019-11-03 DIAGNOSIS — F17210 Nicotine dependence, cigarettes, uncomplicated: Secondary | ICD-10-CM | POA: Diagnosis not present

## 2019-11-03 DIAGNOSIS — G8929 Other chronic pain: Secondary | ICD-10-CM

## 2019-11-03 MED ORDER — HYDROCODONE-ACETAMINOPHEN 7.5-325 MG PO TABS: 1.0000 | ORAL_TABLET | Freq: Four times a day (QID) | ORAL | 0 refills | Status: DC | PRN

## 2019-11-03 NOTE — Patient Instructions (Signed)

## 2019-11-03 NOTE — Progress Notes (Signed)
Patient [name], female DOB:[birth year], 54 y.o. [id number]  Chief Complaint  Patient presents with  . Wrist Pain    L/has a knot/can't bend it backwards  . Back Pain    hurting real bad    HPI  Judith Rodriguez is a 54 y.o. female who has pain in the left dorsal wrist and lower back.  The wrist has less swelling and less pain but she wears a glove on it all the time. She has lower back pain that radiates to the left lower leg at times. She has no new trauma.  Dr. Glo Herring gave her Neurontin but she got sick from it.  She stopped it.  She has no new trauma, no weakness.   Body mass index is 30.12 kg/m.  ROS  Review of Systems  Constitutional:       Patient does not have Diabetes Mellitus. Patient does not have hypertension. Patient does not have COPD or shortness of breath. Patient does not have BMI > 35. Patient has current smoking history.  HENT: Negative for congestion.   Respiratory: Negative for cough and shortness of breath.   Cardiovascular: Negative for chest pain and leg swelling.  Endocrine: Negative for cold intolerance.  Musculoskeletal: Positive for arthralgias and back pain.  Allergic/Immunologic: Positive for environmental allergies.  All other systems reviewed and are negative.   All other systems reviewed and are negative.  The following is a summary of the past history medically, past history surgically, known current medicines, social history and family history.  This information is gathered electronically by the computer from prior information and documentation.  I review this each visit and have found including this information at this point in the chart is beneficial and informative.    Past Medical History:  Diagnosis Date  . Arthritis   . Bulging lumbar disc    pushing on sciatic nerve L4  . Carpal tunnel syndrome   . Depression 12/01/2015  . Dyslipidemia 12/05/2015   Will try diet and exercise first  . Hot flashes 12/01/2015  . Vitamin D  deficiency 12/05/2015   Take vitamin D3 5000 iu daily    Past Surgical History:  Procedure Laterality Date  . ABDOMINAL HYSTERECTOMY    . CESAREAN SECTION    . COLONOSCOPY WITH PROPOFOL N/A 03/06/2018   Procedure: COLONOSCOPY WITH PROPOFOL;  Surgeon: Daneil Dolin, MD;  [facility];  Service: Endoscopy;  Laterality: N/A;  1:30pm  . POLYPECTOMY  03/06/2018   Procedure: POLYPECTOMY;  Surgeon: Daneil Dolin, MD;  [facility];  Service: Endoscopy;;  cecal polyp cs, hepatic flexure polyps  . SALPINGOOPHORECTOMY Left     Family History  Problem Relation Age of Onset  . Cancer Mother        lung  . Cancer Father        lung  . Hypertension Father   . Hypertension Brother   . Aneurysm Paternal Grandmother        brain  . Heart disease Paternal Grandfather        heart attack  . Hypertension Sister   . Colon cancer Paternal Aunt        less than age 60    Social History Social History   Tobacco Use  . Smoking status: Current Every Day Smoker    Packs/day: 0.25    Years: 33.00    Pack years: 8.25    Types: Cigarettes  . Smokeless tobacco: Never Used  Substance Use Topics  .  Alcohol use: No  . Drug use: No    Allergies  Allergen Reactions  . Aspirin Hives  . Gabapentin     Upsets stomach    Current Outpatient Medications  Medication Sig Dispense Refill  . Cholecalciferol (VITAMIN D-3) 5000 units TABS Take 1 tablet by mouth daily.     . citalopram (CELEXA) 20 MG tablet TAKE 1 TABLET(20 MG) BY MOUTH DAILY 30 tablet 6  . diphenhydrAMINE (BENADRYL) 25 mg capsule Take 25 mg by mouth daily as needed for allergies.     Marland Kitchen gabapentin (NEURONTIN) 300 MG capsule Take 1 capsule (300 mg total) by mouth 3 (three) times daily. 90 capsule 1  . HYDROcodone-acetaminophen (NORCO) 7.5-325 MG tablet Take 1 tablet by mouth every 6 (six) hours as needed for moderate pain. 90 tablet 0  . Menthol, Topical Analgesic, (BENGAY EX) Apply 1 application topically daily as  needed (muscle pain).     No current facility-administered medications for this visit.     Physical Exam  Blood pressure (!) 143/103, pulse (!) 113, temperature (!) 97.5 F (36.4 C), height 5\' 4"  (1.626 m), weight 175 lb 8 oz (79.6 kg).  Constitutional: overall normal hygiene, normal nutrition, well developed, normal grooming, normal body habitus. Assistive device:glove left hand  Musculoskeletal: gait and station Limp none, muscle tone and strength are normal, no tremors or atrophy is present.  .  Neurological: coordination overall normal.  Deep tendon reflex/nerve stretch intact.  Sensation normal.  Cranial nerves II-XII intact.   Skin:   Normal overall no scars, lesions, ulcers or rashes. No psoriasis.  Psychiatric: Alert and oriented x 3.  Recent memory intact, remote memory unclear.  Normal mood and affect. Well groomed.  Good eye contact.  Cardiovascular: overall no swelling, no varicosities, no edema bilaterally, normal temperatures of the legs and arms, no clubbing, cyanosis and good capillary refill.  Lymphatic: palpation is normal.  Left dorsal wrist is tender.  She has a small area of tenderness of the mid wrist.  ROM is good.  Spine/Pelvis examination:  Inspection:  Overall, sacoiliac joint benign and hips nontender; without crepitus or defects.   Thoracic spine inspection: Alignment normal without kyphosis present   Lumbar spine inspection:  Alignment  with normal lumbar lordosis, without scoliosis apparent.   Thoracic spine palpation:  without tenderness of spinal processes   Lumbar spine palpation: without tenderness of lumbar area; without tightness of lumbar muscles    Range of Motion:   Lumbar flexion, forward flexion is normal without pain or tenderness    Lumbar extension is full without pain or tenderness   Left lateral bend is normal without pain or tenderness   Right lateral bend is normal without pain or tenderness   Straight leg raising is  normal  Strength & tone: normal   Stability overall normal stability  All other systems reviewed and are negative   The patient has been educated about the nature of the problem(s) and counseled on treatment options.  The patient appeared to understand what I have discussed and is in agreement with it.  Encounter Diagnoses  Name Primary?  . Left wrist pain Yes  . Chronic midline low back pain without sciatica   . Cigarette nicotine dependence without complication     PLAN Call if any problems.  Precautions discussed.  Continue current medications.   Return to clinic 1 month   I have reviewed the [facility] web site prior to prescribing narcotic medicine for  this patient.   Electronically Signed Sanjuana Kava, MD 1/5/20212:27 PM

## 2019-11-09 ENCOUNTER — Ambulatory Visit: Payer: Self-pay | Admitting: Adult Health

## 2019-12-02 ENCOUNTER — Encounter: Payer: Self-pay | Admitting: Orthopaedic Surgery

## 2019-12-02 ENCOUNTER — Ambulatory Visit (INDEPENDENT_AMBULATORY_CARE_PROVIDER_SITE_OTHER): Payer: 59 | Admitting: Orthopaedic Surgery

## 2019-12-02 ENCOUNTER — Other Ambulatory Visit: Payer: Self-pay

## 2019-12-02 VITALS — BP 155/111 | HR 108 | Ht 64.0 in | Wt 180.1 lb

## 2019-12-02 DIAGNOSIS — M5442 Lumbago with sciatica, left side: Secondary | ICD-10-CM

## 2019-12-02 DIAGNOSIS — G8929 Other chronic pain: Secondary | ICD-10-CM

## 2019-12-02 DIAGNOSIS — F17210 Nicotine dependence, cigarettes, uncomplicated: Secondary | ICD-10-CM | POA: Diagnosis not present

## 2019-12-02 MED ORDER — TIZANIDINE HCL 4 MG PO TABS: ORAL_TABLET | ORAL | 1 refills | Status: DC

## 2019-12-02 MED ORDER — HYDROCODONE-ACETAMINOPHEN 7.5-325 MG PO TABS: 1.0000 | ORAL_TABLET | Freq: Four times a day (QID) | ORAL | 0 refills | Status: DC | PRN

## 2019-12-02 NOTE — Patient Instructions (Signed)

## 2019-12-02 NOTE — Progress Notes (Signed)
Patient [name], female DOB:1966/07/05, 54 y.o. [id number]  Chief Complaint  Patient presents with  . Back Pain    Hurting real bad since 3am feels like being in labor    HPI  Judith Rodriguez is a 54 y.o. female who has lower back pain with left sided sciatica.  She has had a flare up of her pain over the last week.  She has just completed a prednisone dose pack for another problem she has being treated by her family doctor.  She is out of pain medicine.  The paresthesias on the left have intensified over the last few days and last longer.  She has no weakness, no trauma.  The cold weather has made it worse she says.  She has new insurance and declines a MRI today or new x-rays.  She is trying to see what deductible she has.   Body mass index is 30.92 kg/m.  ROS  Review of Systems  Constitutional:       Patient does not have Diabetes Mellitus. Patient does not have hypertension. Patient does not have COPD or shortness of breath. Patient does not have BMI > 35. Patient has current smoking history.  HENT: Negative for congestion.   Respiratory: Negative for cough and shortness of breath.   Cardiovascular: Negative for chest pain and leg swelling.  Endocrine: Negative for cold intolerance.  Musculoskeletal: Positive for arthralgias and back pain.  Allergic/Immunologic: Positive for environmental allergies.  All other systems reviewed and are negative.   All other systems reviewed and are negative.  The following is a summary of the past history medically, past history surgically, known current medicines, social history and family history.  This information is gathered electronically by the computer from prior information and documentation.  I review this each visit and have found including this information at this point in the chart is beneficial and informative.    Past Medical History:  Diagnosis Date  . Arthritis   . Bulging lumbar disc    pushing on sciatic nerve L4   . Carpal tunnel syndrome   . Depression 12/01/2015  . Dyslipidemia 12/05/2015   Will try diet and exercise first  . Hot flashes 12/01/2015  . Vitamin D deficiency 12/05/2015   Take vitamin D3 5000 iu daily    Past Surgical History:  Procedure Laterality Date  . ABDOMINAL HYSTERECTOMY    . CESAREAN SECTION    . COLONOSCOPY WITH PROPOFOL N/A 03/06/2018   Procedure: COLONOSCOPY WITH PROPOFOL;  Surgeon: Daneil Dolin, MD;  [facility]: [facility];  Service: Endoscopy;  Laterality: N/A;  1:30pm  . POLYPECTOMY  03/06/2018   Procedure: POLYPECTOMY;  Surgeon: Daneil Dolin, MD;  Location: [facility];  Service: Endoscopy;;  cecal polyp cs, hepatic flexure polyps  . SALPINGOOPHORECTOMY Left     Family History  Problem Relation Age of Onset  . Cancer Mother        lung  . Cancer Father        lung  . Hypertension Father   . Hypertension Brother   . Aneurysm Paternal Grandmother        brain  . Heart disease Paternal Grandfather        heart attack  . Hypertension Sister   . Colon cancer Paternal Aunt        less than age 39    Social History Social History   Tobacco Use  . Smoking status: Current Every Day Smoker    Packs/day: 0.25  Years: 33.00    Pack years: 8.25    Types: Cigarettes  . Smokeless tobacco: Never Used  Substance Use Topics  . Alcohol use: No  . Drug use: No    Allergies  Allergen Reactions  . Aspirin Hives  . Gabapentin     Upsets stomach    Current Outpatient Medications  Medication Sig Dispense Refill  . Cholecalciferol (VITAMIN D-3) 5000 units TABS Take 1 tablet by mouth daily.     . citalopram (CELEXA) 20 MG tablet TAKE 1 TABLET(20 MG) BY MOUTH DAILY 30 tablet 6  . diphenhydrAMINE (BENADRYL) 25 mg capsule Take 25 mg by mouth daily as needed for allergies.     Marland Kitchen gabapentin (NEURONTIN) 300 MG capsule Take 1 capsule (300 mg total) by mouth 3 (three) times daily. 90 capsule 1  . HYDROcodone-acetaminophen (NORCO) 7.5-325 MG tablet Take 1 tablet by  mouth every 6 (six) hours as needed for moderate pain. 90 tablet 0  . Menthol, Topical Analgesic, (BENGAY EX) Apply 1 application topically daily as needed (muscle pain).    Marland Kitchen tiZANidine (ZANAFLEX) 4 MG tablet One by mouth every night before bed as needed for spasm 30 tablet 1   No current facility-administered medications for this visit.     Physical Exam  Blood pressure (!) 155/111, pulse (!) 108, height 5\' 4"  (1.626 m), weight 180 lb 2 oz (81.7 kg).  Constitutional: overall normal hygiene, normal nutrition, well developed, normal grooming, normal body habitus. Assistive device:none  Musculoskeletal: gait and station Limp none, muscle tone and strength are normal, no tremors or atrophy is present.  .  Neurological: coordination overall normal.  Deep tendon reflex/nerve stretch intact.  Sensation normal.  Cranial nerves II-XII intact.   Skin:   Normal overall no scars, lesions, ulcers or rashes. No psoriasis.  Psychiatric: Alert and oriented x 3.  Recent memory intact, remote memory unclear.  Normal mood and affect. Well groomed.  Good eye contact.  Cardiovascular: overall no swelling, no varicosities, no edema bilaterally, normal temperatures of the legs and arms, no clubbing, cyanosis and good capillary refill.  Spine/Pelvis examination:  Inspection:  Overall, sacoiliac joint benign and hips nontender; without crepitus or defects.   Thoracic spine inspection: Alignment normal without kyphosis present   Lumbar spine inspection:  Alignment  with normal lumbar lordosis, without scoliosis apparent.   Thoracic spine palpation:  without tenderness of spinal processes   Lumbar spine palpation: without tenderness of lumbar area; without tightness of lumbar muscles    Range of Motion:   Lumbar flexion, forward flexion is normal without pain or tenderness    Lumbar extension is full without pain or tenderness   Left lateral bend is normal without pain or tenderness   Right lateral  bend is normal without pain or tenderness   Straight leg raising is normal  Strength & tone: normal   Stability overall normal stability  Lymphatic: palpation is normal.  All other systems reviewed and are negative   The patient has been educated about the nature of the problem(s) and counseled on treatment options.  The patient appeared to understand what I have discussed and is in agreement with it.  Encounter Diagnoses  Name Primary?  . Chronic left-sided low back pain with left-sided sciatica Yes  . Cigarette nicotine dependence without complication     PLAN Call if any problems.  Precautions discussed.  Continue current medications. I will begin Zanaflex also.  Return to clinic 1 month   I have reviewed  the [facility] Controlled Substance Reporting System web site prior to prescribing narcotic medicine for this patient.   Electronically Signed Sanjuana Kava, MD 2/3/20219:07 AM

## 2019-12-22 ENCOUNTER — Telehealth: Payer: Self-pay | Admitting: Orthopaedic Surgery

## 2019-12-22 NOTE — Telephone Encounter (Signed)
Patient requests refill on Hydrocodone/Acetaminophen 7.5-325  Mgs.  Qty  90  Sig: Take 1 tablet by mouth every 6 (six) hours as needed for moderate pain.  Patient states she uses [facility] in [place]

## 2019-12-23 MED ORDER — HYDROCODONE-ACETAMINOPHEN 7.5-325 MG PO TABS: 1.0000 | ORAL_TABLET | Freq: Four times a day (QID) | ORAL | 0 refills | Status: DC | PRN

## 2019-12-28 DIAGNOSIS — S42309A Unspecified fracture of shaft of humerus, unspecified arm, initial encounter for closed fracture: Secondary | ICD-10-CM | POA: Insufficient documentation

## 2019-12-28 DIAGNOSIS — W19XXXA Unspecified fall, initial encounter: Secondary | ICD-10-CM | POA: Insufficient documentation

## 2019-12-28 HISTORY — PX: HUMERUS FRACTURE SURGERY: SHX670

## 2019-12-30 ENCOUNTER — Ambulatory Visit: Payer: 59 | Admitting: Orthopaedic Surgery

## 2019-12-30 MED ORDER — ENOXAPARIN SODIUM 60 MG/0.6ML ~~LOC~~ SOLN
0.60 | SUBCUTANEOUS | Status: DC
Start: 2019-12-30 — End: 2019-12-30

## 2019-12-30 MED ORDER — ACETAMINOPHEN 325 MG PO TABS
325.00 | ORAL_TABLET | ORAL | Status: DC
Start: 2019-12-30 — End: 2019-12-30

## 2019-12-30 MED ORDER — POLYETHYLENE GLYCOL 3350 17 GM/SCOOP PO POWD
17.00 | ORAL | Status: DC
Start: 2019-12-31 — End: 2019-12-30

## 2019-12-30 MED ORDER — TRAMADOL HCL 50 MG PO TABS
50.00 | ORAL_TABLET | ORAL | Status: DC
Start: ? — End: 2019-12-30

## 2019-12-30 MED ORDER — CITALOPRAM HYDROBROMIDE 20 MG PO TABS
20.00 | ORAL_TABLET | ORAL | Status: DC
Start: 2019-12-30 — End: 2019-12-30

## 2019-12-30 MED ORDER — SENNOSIDES-DOCUSATE SODIUM 8.6-50 MG PO TABS
2.00 | ORAL_TABLET | ORAL | Status: DC
Start: 2019-12-30 — End: 2019-12-30

## 2019-12-30 MED ORDER — OXYCODONE-ACETAMINOPHEN 5-325 MG PO TABS
1.00 | ORAL_TABLET | ORAL | Status: DC
Start: ? — End: 2019-12-30

## 2019-12-31 ENCOUNTER — Telehealth: Payer: Self-pay

## 2019-12-31 NOTE — Telephone Encounter (Signed)
Patient called stating she has just been released from [facility] yesterday. She fell off a ladder and broke both of her shoulders. She wanted to be sure that we were informed about this so if Dr. Luna Glasgow wanted to look at this information.

## 2020-01-05 ENCOUNTER — Ambulatory Visit (INDEPENDENT_AMBULATORY_CARE_PROVIDER_SITE_OTHER): Payer: 59 | Admitting: Orthopaedic Surgery

## 2020-01-05 ENCOUNTER — Encounter: Payer: Self-pay | Admitting: Orthopaedic Surgery

## 2020-01-05 ENCOUNTER — Other Ambulatory Visit: Payer: Self-pay

## 2020-01-05 VITALS — BP 142/102 | HR 93 | Ht 64.0 in | Wt 180.0 lb

## 2020-01-05 DIAGNOSIS — M25511 Pain in right shoulder: Secondary | ICD-10-CM | POA: Diagnosis not present

## 2020-01-05 DIAGNOSIS — M5442 Lumbago with sciatica, left side: Secondary | ICD-10-CM

## 2020-01-05 DIAGNOSIS — Z789 Other specified health status: Secondary | ICD-10-CM | POA: Insufficient documentation

## 2020-01-05 DIAGNOSIS — G8929 Other chronic pain: Secondary | ICD-10-CM

## 2020-01-05 DIAGNOSIS — Z7409 Other reduced mobility: Secondary | ICD-10-CM | POA: Insufficient documentation

## 2020-01-05 DIAGNOSIS — F17210 Nicotine dependence, cigarettes, uncomplicated: Secondary | ICD-10-CM | POA: Diagnosis not present

## 2020-01-05 NOTE — Progress Notes (Signed)
Patient CR:[id number] Spearing, female DOB:1966-08-05, 54 y.o. [id number]  Chief Complaint  Patient presents with  . Back Pain    HPI  Judith Rodriguez is a 54 y.o. female who has chronic lower back pain.  She had a fall off a ladder on 12-28-2019 and fractured her right shoulder that needed surgery which was done at [facility].  She has gotten low on her pain medicine and she is here requesting more.  She had refill on 12-23-2019 and it is not due until 01-20-2020.  I told her I could not add more or refill now.  She has a "pain contract" with me and she knows she cannot get medicine earlier than a month for any reason.  She has talked to the doctor at [facility] and was told they could not give any pain medicine.  I have explained the law to her.  She understands.  She is to return to [place] in two weeks.  Her back pain is stable, she has no weakness.  Her main pain now is her right shoulder.     Body mass index is 30.9 kg/m.  ROS  Review of Systems  Constitutional:       Patient does not have Diabetes Mellitus. Patient does not have hypertension. Patient does not have COPD or shortness of breath. Patient does not have BMI > 35. Patient has current smoking history.  HENT: Negative for congestion.   Respiratory: Negative for cough and shortness of breath.   Cardiovascular: Negative for chest pain and leg swelling.  Endocrine: Negative for cold intolerance.  Musculoskeletal: Positive for arthralgias and back pain.  Allergic/Immunologic: Positive for environmental allergies.  All other systems reviewed and are negative.   All other systems reviewed and are negative.  The following is a summary of the past history medically, past history surgically, known current medicines, social history and family history.  This information is gathered electronically by the computer from prior information and documentation.  I review this each visit and have found including this information at this  point in the chart is beneficial and informative.    Past Medical History:  Diagnosis Date  . Arthritis   . Bulging lumbar disc    pushing on sciatic nerve L4  . Carpal tunnel syndrome   . Depression 12/01/2015  . Dyslipidemia 12/05/2015   Will try diet and exercise first  . Hot flashes 12/01/2015  . Vitamin D deficiency 12/05/2015   Take vitamin D3 5000 iu daily    Past Surgical History:  Procedure Laterality Date  . ABDOMINAL HYSTERECTOMY    . CESAREAN SECTION    . COLONOSCOPY WITH PROPOFOL N/A 03/06/2018   Procedure: COLONOSCOPY WITH PROPOFOL;  Surgeon: Daneil Dolin, MD;  [facility];  Service: Endoscopy;  Laterality: N/A;  1:30pm  . POLYPECTOMY  03/06/2018   Procedure: POLYPECTOMY;  Surgeon: Daneil Dolin, MD;  [facility];  Service: Endoscopy;;  cecal polyp cs, hepatic flexure polyps  . SALPINGOOPHORECTOMY Left     Family History  Problem Relation Age of Onset  . Cancer Mother        lung  . Cancer Father        lung  . Hypertension Father   . Hypertension Brother   . Aneurysm Paternal Grandmother        brain  . Heart disease Paternal Grandfather        heart attack  . Hypertension Sister   . Colon cancer Paternal Aunt  less than age 16    Social History Social History   Tobacco Use  . Smoking status: Current Every Day Smoker    Packs/day: 0.25    Years: 33.00    Pack years: 8.25    Types: Cigarettes  . Smokeless tobacco: Never Used  Substance Use Topics  . Alcohol use: No  . Drug use: No    Allergies  Allergen Reactions  . Aspirin Hives  . Gabapentin     Upsets stomach    Current Outpatient Medications  Medication Sig Dispense Refill  . Cholecalciferol (VITAMIN D-3) 5000 units TABS Take 1 tablet by mouth daily.     . citalopram (CELEXA) 20 MG tablet TAKE 1 TABLET(20 MG) BY MOUTH DAILY 30 tablet 6  . diphenhydrAMINE (BENADRYL) 25 mg capsule Take 25 mg by mouth daily as needed for allergies.     Marland Kitchen gabapentin  (NEURONTIN) 300 MG capsule Take 1 capsule (300 mg total) by mouth 3 (three) times daily. 90 capsule 1  . HYDROcodone-acetaminophen (NORCO) 7.5-325 MG tablet Take 1 tablet by mouth every 6 (six) hours as needed for moderate pain. 90 tablet 0  . Menthol, Topical Analgesic, (BENGAY EX) Apply 1 application topically daily as needed (muscle pain).    Marland Kitchen tiZANidine (ZANAFLEX) 4 MG tablet One by mouth every night before bed as needed for spasm 30 tablet 1   No current facility-administered medications for this visit.     Physical Exam  Blood pressure (!) 142/102, pulse 93, height 5\' 4"  (1.626 m), weight 180 lb (81.6 kg).  Constitutional: overall normal hygiene, normal nutrition, well developed, normal grooming, normal body habitus. Assistive device:sling  Musculoskeletal: gait and station Limp none, muscle tone and strength are normal, no tremors or atrophy is present.  .  Neurological: coordination overall normal.  Deep tendon reflex/nerve stretch intact.  Sensation normal.  Cranial nerves II-XII intact.   Skin:   Normal overall no scars, lesions, ulcers or rashes. No psoriasis.  Psychiatric: Alert and oriented x 3.  Recent memory intact, remote memory unclear.  Normal mood and affect. Well groomed.  Good eye contact.  Cardiovascular: overall no swelling, no varicosities, no edema bilaterally, normal temperatures of the legs and arms, no clubbing, cyanosis and good capillary refill.  Lymphatic: palpation is normal.  She has bandage over the right upper lateral arm.  I did not remove it.  Spine/Pelvis examination:  Inspection:  Overall, sacoiliac joint benign and hips nontender; without crepitus or defects.   Thoracic spine inspection: Alignment normal without kyphosis present   Lumbar spine inspection:  Alignment  with normal lumbar lordosis, without scoliosis apparent.   Thoracic spine palpation:  without tenderness of spinal processes   Lumbar spine palpation: without tenderness of  lumbar area; without tightness of lumbar muscles    Range of Motion:   Lumbar flexion, forward flexion is normal without pain or tenderness    Lumbar extension is full without pain or tenderness   Left lateral bend is normal without pain or tenderness   Right lateral bend is normal without pain or tenderness   Straight leg raising is normal  Strength & tone: normal   Stability overall normal stability  All other systems reviewed and are negative   The patient has been educated about the nature of the problem(s) and counseled on treatment options.  The patient appeared to understand what I have discussed and is in agreement with it.  Encounter Diagnoses  Name Primary?  . Chronic left-sided low back  pain with left-sided sciatica Yes  . Cigarette nicotine dependence without complication   . Acute pain of right shoulder     PLAN Call if any problems.  Precautions discussed.  Continue current medications.   Return to clinic 1 month   Electronically Signed Sanjuana Kava, MD 3/9/20219:17 AM

## 2020-01-05 NOTE — Patient Instructions (Signed)
Steps to Quit Smoking Smoking tobacco is the leading cause of preventable death. It can affect almost every organ in the body. Smoking puts you and people around you at risk for many serious, long-lasting (chronic) diseases. Quitting smoking can be hard, but it is one of the best things that you can do for your health. It is never too late to quit. How do I get ready to quit? When you decide to quit smoking, make a plan to help you succeed. Before you quit:  Pick a date to quit. Set a date within the next 2 weeks to give you time to prepare.  Write down the reasons why you are quitting. Keep this list in places where you will see it often.  Tell your family, friends, and co-workers that you are quitting. Their support is important.  Talk with your doctor about the choices that may help you quit.  Find out if your health insurance will pay for these treatments.  Know the people, places, things, and activities that make you want to smoke (triggers). Avoid them. What first steps can I take to quit smoking?  Throw away all cigarettes at home, at work, and in your car.  Throw away the things that you use when you smoke, such as ashtrays and lighters.  Clean your car. Make sure to empty the ashtray.  Clean your home, including curtains and carpets. What can I do to help me quit smoking? Talk with your doctor about taking medicines and seeing a counselor at the same time. You are more likely to succeed when you do both.  If you are pregnant or breastfeeding, talk with your doctor about counseling or other ways to quit smoking. Do not take medicine to help you quit smoking unless your doctor tells you to do so. To quit smoking: Quit right away  Quit smoking totally, instead of slowly cutting back on how much you smoke over a period of time.  Go to counseling. You are more likely to quit if you go to counseling sessions regularly. Take medicine You may take medicines to help you quit. Some  medicines need a prescription, and some you can buy over-the-counter. Some medicines may contain a drug called nicotine to replace the nicotine in cigarettes. Medicines may:  Help you to stop having the desire to smoke (cravings).  Help to stop the problems that come when you stop smoking (withdrawal symptoms). Your doctor may ask you to use:  Nicotine patches, gum, or lozenges.  Nicotine inhalers or sprays.  Non-nicotine medicine that is taken by mouth. Find resources Find resources and other ways to help you quit smoking and remain smoke-free after you quit. These resources are most helpful when you use them often. They include:  Online chats with a counselor.  Phone quitlines.  Printed self-help materials.  Support groups or group counseling.  Text messaging programs.  Mobile phone apps. Use apps on your mobile phone or tablet that can help you stick to your quit plan. There are many free apps for mobile phones and tablets as well as websites. Examples include Quit Guide from the CDC and smokefree.gov  What things can I do to make it easier to quit?   Talk to your family and friends. Ask them to support and encourage you.  Call a phone quitline (1-800-QUIT-NOW), reach out to support groups, or work with a counselor.  Ask people who smoke to not smoke around you.  Avoid places that make you want to smoke,   such as: ? Bars. ? Parties. ? Smoke-break areas at work.  Spend time with people who do not smoke.  Lower the stress in your life. Stress can make you want to smoke. Try these things to help your stress: ? Getting regular exercise. ? Doing deep-breathing exercises. ? Doing yoga. ? Meditating. ? Doing a body scan. To do this, close your eyes, focus on one area of your body at a time from head to toe. Notice which parts of your body are tense. Try to relax the muscles in those areas. How will I feel when I quit smoking? Day 1 to 3 weeks Within the first 24 hours,  you may start to have some problems that come from quitting tobacco. These problems are very bad 2-3 days after you quit, but they do not often last for more than 2-3 weeks. You may get these symptoms:  Mood swings.  Feeling restless, nervous, angry, or annoyed.  Trouble concentrating.  Dizziness.  Strong desire for high-sugar foods and nicotine.  Weight gain.  Trouble pooping (constipation).  Feeling like you may vomit (nausea).  Coughing or a sore throat.  Changes in how the medicines that you take for other issues work in your body.  Depression.  Trouble sleeping (insomnia). Week 3 and afterward After the first 2-3 weeks of quitting, you may start to notice more positive results, such as:  Better sense of smell and taste.  Less coughing and sore throat.  Slower heart rate.  Lower blood pressure.  Clearer skin.  Better breathing.  Fewer sick days. Quitting smoking can be hard. Do not give up if you fail the first time. Some people need to try a few times before they succeed. Do your best to stick to your quit plan, and talk with your doctor if you have any questions or concerns. Summary  Smoking tobacco is the leading cause of preventable death. Quitting smoking can be hard, but it is one of the best things that you can do for your health.  When you decide to quit smoking, make a plan to help you succeed.  Quit smoking right away, not slowly over a period of time.  When you start quitting, seek help from your doctor, family, or friends. This information is not intended to replace advice given to you by your health care provider. Make sure you discuss any questions you have with your health care provider. Document Revised: 07/10/2019 Document Reviewed: 01/03/2019 Elsevier Patient Education  2020 [employer]  

## 2020-01-13 ENCOUNTER — Telehealth: Payer: Self-pay | Admitting: Orthopaedic Surgery

## 2020-01-13 NOTE — Telephone Encounter (Signed)
Patient requests refill on Hydrocodone/Acetaminophen 7.5-325  Mgs.  Qty  90  Sig: Take 1 tablet by mouth every 6 (six) hours as needed for moderate pain.  Patient states she uses [facility] in [place]

## 2020-01-13 NOTE — Telephone Encounter (Signed)
One week too early.  Not due until 3-24

## 2020-01-15 NOTE — Telephone Encounter (Signed)
Patient was advised.  

## 2020-01-20 ENCOUNTER — Telehealth: Payer: Self-pay

## 2020-01-20 MED ORDER — HYDROCODONE-ACETAMINOPHEN 7.5-325 MG PO TABS: 1.0000 | ORAL_TABLET | Freq: Four times a day (QID) | ORAL | 0 refills | Status: DC | PRN

## 2020-01-20 NOTE — Telephone Encounter (Signed)
Hydrocodone-Acetaminophen 7.5/325mg   Qty 90 Tablets    PATIENT USES [facility]

## 2020-02-02 ENCOUNTER — Ambulatory Visit: Payer: 59 | Admitting: Orthopaedic Surgery

## 2020-02-18 ENCOUNTER — Other Ambulatory Visit: Payer: Self-pay

## 2020-02-18 ENCOUNTER — Encounter: Payer: Self-pay | Admitting: Orthopaedic Surgery

## 2020-02-18 ENCOUNTER — Ambulatory Visit (INDEPENDENT_AMBULATORY_CARE_PROVIDER_SITE_OTHER): Payer: 59 | Admitting: Orthopaedic Surgery

## 2020-02-18 VITALS — Ht 64.0 in | Wt 173.0 lb

## 2020-02-18 DIAGNOSIS — F17210 Nicotine dependence, cigarettes, uncomplicated: Secondary | ICD-10-CM | POA: Diagnosis not present

## 2020-02-18 DIAGNOSIS — G8929 Other chronic pain: Secondary | ICD-10-CM

## 2020-02-18 DIAGNOSIS — M5442 Lumbago with sciatica, left side: Secondary | ICD-10-CM | POA: Diagnosis not present

## 2020-02-18 MED ORDER — HYDROCODONE-ACETAMINOPHEN 7.5-325 MG PO TABS: 1.0000 | ORAL_TABLET | Freq: Four times a day (QID) | ORAL | 0 refills | Status: DC | PRN

## 2020-02-18 MED ORDER — PREDNISONE 5 MG (21) PO TBPK: ORAL_TABLET | ORAL | 0 refills | Status: DC

## 2020-02-18 NOTE — Progress Notes (Signed)
Patient [id number] Mcgaugh, female DOB:[birth year], 54 y.o. [id number]  Chief Complaint  Patient presents with  . Back Pain    HPI  Judith Rodriguez is a 54 y.o. female who has chronic lower back pain She has had more pain in the last few days with the cold weather we are undergoing.  She has no paresthesias, no weakness.  She is taking her medicine and doing her exercises.   Body mass index is 29.7 kg/m.  ROS  Review of Systems  Constitutional:       Patient does not have Diabetes Mellitus. Patient does not have hypertension. Patient does not have COPD or shortness of breath. Patient does not have BMI > 35. Patient has current smoking history.  HENT: Negative for congestion.   Respiratory: Negative for cough and shortness of breath.   Cardiovascular: Negative for chest pain and leg swelling.  Endocrine: Negative for cold intolerance.  Musculoskeletal: Positive for arthralgias and back pain.  Allergic/Immunologic: Positive for environmental allergies.  All other systems reviewed and are negative.   All other systems reviewed and are negative.  The following is a summary of the past history medically, past history surgically, known current medicines, social history and family history.  This information is gathered electronically by the computer from prior information and documentation.  I review this each visit and have found including this information at this point in the chart is beneficial and informative.    Past Medical History:  Diagnosis Date  . Arthritis   . Bulging lumbar disc    pushing on sciatic nerve L4  . Carpal tunnel syndrome   . Depression 12/01/2015  . Dyslipidemia 12/05/2015   Will try diet and exercise first  . Hot flashes 12/01/2015  . Vitamin D deficiency 12/05/2015   Take vitamin D3 5000 iu daily    Past Surgical History:  Procedure Laterality Date  . ABDOMINAL HYSTERECTOMY    . CESAREAN SECTION    . COLONOSCOPY WITH PROPOFOL N/A 03/06/2018   Procedure:  COLONOSCOPY WITH PROPOFOL;  Surgeon: Daneil Dolin, MD;  [facility];  Service: Endoscopy;  Laterality: N/A;  1:30pm  . POLYPECTOMY  03/06/2018   Procedure: POLYPECTOMY;  Surgeon: Daneil Dolin, MD;  Location: [facility];  Service: Endoscopy;;  cecal polyp cs, hepatic flexure polyps  . SALPINGOOPHORECTOMY Left     Family History  Problem Relation Age of Onset  . Cancer Mother        lung  . Cancer Father        lung  . Hypertension Father   . Hypertension Brother   . Aneurysm Paternal Grandmother        brain  . Heart disease Paternal Grandfather        heart attack  . Hypertension Sister   . Colon cancer Paternal Aunt        less than age 12    Social History Social History   Tobacco Use  . Smoking status: Current Every Day Smoker    Packs/day: 0.25    Years: 33.00    Pack years: 8.25    Types: Cigarettes  . Smokeless tobacco: Never Used  Substance Use Topics  . Alcohol use: No  . Drug use: No    Allergies  Allergen Reactions  . Aspirin Hives  . Gabapentin     Upsets stomach    Current Outpatient Medications  Medication Sig Dispense Refill  . Cholecalciferol (VITAMIN D-3) 5000 units TABS Take 1 tablet by  mouth daily.     . citalopram (CELEXA) 20 MG tablet TAKE 1 TABLET(20 MG) BY MOUTH DAILY 30 tablet 6  . diphenhydrAMINE (BENADRYL) 25 mg capsule Take 25 mg by mouth daily as needed for allergies.     Marland Kitchen gabapentin (NEURONTIN) 300 MG capsule Take 1 capsule (300 mg total) by mouth 3 (three) times daily. 90 capsule 1  . HYDROcodone-acetaminophen (NORCO) 7.5-325 MG tablet Take 1 tablet by mouth every 6 (six) hours as needed for moderate pain. 90 tablet 0  . Menthol, Topical Analgesic, (BENGAY EX) Apply 1 application topically daily as needed (muscle pain).    Marland Kitchen tiZANidine (ZANAFLEX) 4 MG tablet One by mouth every night before bed as needed for spasm 30 tablet 1  . predniSONE (STERAPRED UNI-PAK 21 TAB) 5 MG (21) TBPK tablet Take 6 pills first day; 5  pills second day; 4 pills third day; 3 pills fourth day; 2 pills next day and 1 pill last day. 21 tablet 0   No current facility-administered medications for this visit.     Physical Exam  Height 5\' 4"  (1.626 m), weight 173 lb (78.5 kg).  Constitutional: overall normal hygiene, normal nutrition, well developed, normal grooming, normal body habitus. Assistive device:none  Musculoskeletal: gait and station Limp none, muscle tone and strength are normal, no tremors or atrophy is present.  .  Neurological: coordination overall normal.  Deep tendon reflex/nerve stretch intact.  Sensation normal.  Cranial nerves II-XII intact.   Skin:   Normal overall no scars, lesions, ulcers or rashes. No psoriasis.  Psychiatric: Alert and oriented x 3.  Recent memory intact, remote memory unclear.  Normal mood and affect. Well groomed.  Good eye contact.  Cardiovascular: overall no swelling, no varicosities, no edema bilaterally, normal temperatures of the legs and arms, no clubbing, cyanosis and good capillary refill.  Lymphatic: palpation is normal.  Spine/Pelvis examination:  Inspection:  Overall, sacoiliac joint benign and hips nontender; without crepitus or defects.   Thoracic spine inspection: Alignment normal without kyphosis present   Lumbar spine inspection:  Alignment  with normal lumbar lordosis, without scoliosis apparent.   Thoracic spine palpation:  without tenderness of spinal processes   Lumbar spine palpation: without tenderness of lumbar area; without tightness of lumbar muscles    Range of Motion:   Lumbar flexion, forward flexion is normal without pain or tenderness    Lumbar extension is full without pain or tenderness   Left lateral bend is normal without pain or tenderness   Right lateral bend is normal without pain or tenderness   Straight leg raising is normal  Strength & tone: normal   Stability overall normal stability  All other systems reviewed and are negative    The patient has been educated about the nature of the problem(s) and counseled on treatment options.  The patient appeared to understand what I have discussed and is in agreement with it.  Encounter Diagnoses  Name Primary?  . Chronic left-sided low back pain with left-sided sciatica Yes  . Cigarette nicotine dependence without complication     PLAN Call if any problems.  Precautions discussed.  Continue current medications. I will also call in prednisone dose pack.  Return to clinic 1 month   I have reviewed the [place] web site prior to prescribing narcotic medicine for this patient.   Electronically Signed Sanjuana Kava, MD 4/22/20219:32 AM

## 2020-02-18 NOTE — Patient Instructions (Signed)
Steps to Quit Smoking Smoking tobacco is the leading cause of preventable death. It can affect almost every organ in the body. Smoking puts you and people around you at risk for many serious, long-lasting (chronic) diseases. Quitting smoking can be hard, but it is one of the best things that you can do for your health. It is never too late to quit. How do I get ready to quit? When you decide to quit smoking, make a plan to help you succeed. Before you quit:  Pick a date to quit. Set a date within the next 2 weeks to give you time to prepare.  Write down the reasons why you are quitting. Keep this list in places where you will see it often.  Tell your family, friends, and co-workers that you are quitting. Their support is important.  Talk with your doctor about the choices that may help you quit.  Find out if your health insurance will pay for these treatments.  Know the people, places, things, and activities that make you want to smoke (triggers). Avoid them. What first steps can I take to quit smoking?  Throw away all cigarettes at home, at work, and in your car.  Throw away the things that you use when you smoke, such as ashtrays and lighters.  Clean your car. Make sure to empty the ashtray.  Clean your home, including curtains and carpets. What can I do to help me quit smoking? Talk with your doctor about taking medicines and seeing a counselor at the same time. You are more likely to succeed when you do both.  If you are pregnant or breastfeeding, talk with your doctor about counseling or other ways to quit smoking. Do not take medicine to help you quit smoking unless your doctor tells you to do so. To quit smoking: Quit right away  Quit smoking totally, instead of slowly cutting back on how much you smoke over a period of time.  Go to counseling. You are more likely to quit if you go to counseling sessions regularly. Take medicine You may take medicines to help you quit. Some  medicines need a prescription, and some you can buy over-the-counter. Some medicines may contain a drug called nicotine to replace the nicotine in cigarettes. Medicines may:  Help you to stop having the desire to smoke (cravings).  Help to stop the problems that come when you stop smoking (withdrawal symptoms). Your doctor may ask you to use:  Nicotine patches, gum, or lozenges.  Nicotine inhalers or sprays.  Non-nicotine medicine that is taken by mouth. Find resources Find resources and other ways to help you quit smoking and remain smoke-free after you quit. These resources are most helpful when you use them often. They include:  Online chats with a counselor.  Phone quitlines.  Printed self-help materials.  Support groups or group counseling.  Text messaging programs.  Mobile phone apps. Use apps on your mobile phone or tablet that can help you stick to your quit plan. There are many free apps for mobile phones and tablets as well as websites. Examples include Quit Guide from the CDC and smokefree.gov  What things can I do to make it easier to quit?   Talk to your family and friends. Ask them to support and encourage you.  Call a phone quitline (1-800-QUIT-NOW), reach out to support groups, or work with a counselor.  Ask people who smoke to not smoke around you.  Avoid places that make you want to smoke,   such as: ? Bars. ? Parties. ? Smoke-break areas at work.  Spend time with people who do not smoke.  Lower the stress in your life. Stress can make you want to smoke. Try these things to help your stress: ? Getting regular exercise. ? Doing deep-breathing exercises. ? Doing yoga. ? Meditating. ? Doing a body scan. To do this, close your eyes, focus on one area of your body at a time from head to toe. Notice which parts of your body are tense. Try to relax the muscles in those areas. How will I feel when I quit smoking? Day 1 to 3 weeks Within the first 24 hours,  you may start to have some problems that come from quitting tobacco. These problems are very bad 2-3 days after you quit, but they do not often last for more than 2-3 weeks. You may get these symptoms:  Mood swings.  Feeling restless, nervous, angry, or annoyed.  Trouble concentrating.  Dizziness.  Strong desire for high-sugar foods and nicotine.  Weight gain.  Trouble pooping (constipation).  Feeling like you may vomit (nausea).  Coughing or a sore throat.  Changes in how the medicines that you take for other issues work in your body.  Depression.  Trouble sleeping (insomnia). Week 3 and afterward After the first 2-3 weeks of quitting, you may start to notice more positive results, such as:  Better sense of smell and taste.  Less coughing and sore throat.  Slower heart rate.  Lower blood pressure.  Clearer skin.  Better breathing.  Fewer sick days. Quitting smoking can be hard. Do not give up if you fail the first time. Some people need to try a few times before they succeed. Do your best to stick to your quit plan, and talk with your doctor if you have any questions or concerns. Summary  Smoking tobacco is the leading cause of preventable death. Quitting smoking can be hard, but it is one of the best things that you can do for your health.  When you decide to quit smoking, make a plan to help you succeed.  Quit smoking right away, not slowly over a period of time.  When you start quitting, seek help from your doctor, family, or friends. This information is not intended to replace advice given to you by your health care provider. Make sure you discuss any questions you have with your health care provider. Document Revised: 07/10/2019 Document Reviewed: 01/03/2019 Elsevier Patient Education  2020 [employer]  

## 2020-03-10 ENCOUNTER — Encounter: Payer: Self-pay | Admitting: Orthopaedic Surgery

## 2020-03-10 ENCOUNTER — Ambulatory Visit (INDEPENDENT_AMBULATORY_CARE_PROVIDER_SITE_OTHER): Payer: 59 | Admitting: Orthopaedic Surgery

## 2020-03-10 ENCOUNTER — Other Ambulatory Visit: Payer: Self-pay

## 2020-03-10 VITALS — BP 142/96 | HR 86 | Ht 64.0 in | Wt 174.0 lb

## 2020-03-10 DIAGNOSIS — M2550 Pain in unspecified joint: Secondary | ICD-10-CM

## 2020-03-10 DIAGNOSIS — G8929 Other chronic pain: Secondary | ICD-10-CM

## 2020-03-10 DIAGNOSIS — F17210 Nicotine dependence, cigarettes, uncomplicated: Secondary | ICD-10-CM

## 2020-03-10 DIAGNOSIS — M5442 Lumbago with sciatica, left side: Secondary | ICD-10-CM

## 2020-03-10 NOTE — Patient Instructions (Signed)
Steps to Quit Smoking Smoking tobacco is the leading cause of preventable death. It can affect almost every organ in the body. Smoking puts you and people around you at risk for many serious, long-lasting (chronic) diseases. Quitting smoking can be hard, but it is one of the best things that you can do for your health. It is never too late to quit. How do I get ready to quit? When you decide to quit smoking, make a plan to help you succeed. Before you quit:  Pick a date to quit. Set a date within the next 2 weeks to give you time to prepare.  Write down the reasons why you are quitting. Keep this list in places where you will see it often.  Tell your family, friends, and co-workers that you are quitting. Their support is important.  Talk with your doctor about the choices that may help you quit.  Find out if your health insurance will pay for these treatments.  Know the people, places, things, and activities that make you want to smoke (triggers). Avoid them. What first steps can I take to quit smoking?  Throw away all cigarettes at home, at work, and in your car.  Throw away the things that you use when you smoke, such as ashtrays and lighters.  Clean your car. Make sure to empty the ashtray.  Clean your home, including curtains and carpets. What can I do to help me quit smoking? Talk with your doctor about taking medicines and seeing a counselor at the same time. You are more likely to succeed when you do both.  If you are pregnant or breastfeeding, talk with your doctor about counseling or other ways to quit smoking. Do not take medicine to help you quit smoking unless your doctor tells you to do so. To quit smoking: Quit right away  Quit smoking totally, instead of slowly cutting back on how much you smoke over a period of time.  Go to counseling. You are more likely to quit if you go to counseling sessions regularly. Take medicine You may take medicines to help you quit. Some  medicines need a prescription, and some you can buy over-the-counter. Some medicines may contain a drug called nicotine to replace the nicotine in cigarettes. Medicines may:  Help you to stop having the desire to smoke (cravings).  Help to stop the problems that come when you stop smoking (withdrawal symptoms). Your doctor may ask you to use:  Nicotine patches, gum, or lozenges.  Nicotine inhalers or sprays.  Non-nicotine medicine that is taken by mouth. Find resources Find resources and other ways to help you quit smoking and remain smoke-free after you quit. These resources are most helpful when you use them often. They include:  Online chats with a counselor.  Phone quitlines.  Printed self-help materials.  Support groups or group counseling.  Text messaging programs.  Mobile phone apps. Use apps on your mobile phone or tablet that can help you stick to your quit plan. There are many free apps for mobile phones and tablets as well as websites. Examples include Quit Guide from the CDC and smokefree.gov  What things can I do to make it easier to quit?   Talk to your family and friends. Ask them to support and encourage you.  Call a phone quitline (1-800-QUIT-NOW), reach out to support groups, or work with a counselor.  Ask people who smoke to not smoke around you.  Avoid places that make you want to smoke,   such as: ? Bars. ? Parties. ? Smoke-break areas at work.  Spend time with people who do not smoke.  Lower the stress in your life. Stress can make you want to smoke. Try these things to help your stress: ? Getting regular exercise. ? Doing deep-breathing exercises. ? Doing yoga. ? Meditating. ? Doing a body scan. To do this, close your eyes, focus on one area of your body at a time from head to toe. Notice which parts of your body are tense. Try to relax the muscles in those areas. How will I feel when I quit smoking? Day 1 to 3 weeks Within the first 24 hours,  you may start to have some problems that come from quitting tobacco. These problems are very bad 2-3 days after you quit, but they do not often last for more than 2-3 weeks. You may get these symptoms:  Mood swings.  Feeling restless, nervous, angry, or annoyed.  Trouble concentrating.  Dizziness.  Strong desire for high-sugar foods and nicotine.  Weight gain.  Trouble pooping (constipation).  Feeling like you may vomit (nausea).  Coughing or a sore throat.  Changes in how the medicines that you take for other issues work in your body.  Depression.  Trouble sleeping (insomnia). Week 3 and afterward After the first 2-3 weeks of quitting, you may start to notice more positive results, such as:  Better sense of smell and taste.  Less coughing and sore throat.  Slower heart rate.  Lower blood pressure.  Clearer skin.  Better breathing.  Fewer sick days. Quitting smoking can be hard. Do not give up if you fail the first time. Some people need to try a few times before they succeed. Do your best to stick to your quit plan, and talk with your doctor if you have any questions or concerns. Summary  Smoking tobacco is the leading cause of preventable death. Quitting smoking can be hard, but it is one of the best things that you can do for your health.  When you decide to quit smoking, make a plan to help you succeed.  Quit smoking right away, not slowly over a period of time.  When you start quitting, seek help from your doctor, family, or friends. This information is not intended to replace advice given to you by your health care provider. Make sure you discuss any questions you have with your health care provider. Document Revised: 07/10/2019 Document Reviewed: 01/03/2019 Elsevier Patient Education  2020 [employer]  

## 2020-03-10 NOTE — Progress Notes (Signed)
Patient [id number] Costilow, female DOB:1966-01-07, 54 y.o. [id number]  Chief Complaint  Patient presents with  . Back Pain    HPI  Judith Rodriguez is a 54 y.o. female who has lower back pain, shoulder pain and multiple joint pains.  She is taking her medicine.  She is using pain patches.  She would like more medicine next refill. She has no weakness, no new trauma.   Body mass index is 29.87 kg/m.  ROS  Review of Systems  Constitutional:       Patient does not have Diabetes Mellitus. Patient does not have hypertension. Patient does not have COPD or shortness of breath. Patient does not have BMI > 35. Patient has current smoking history.  HENT: Negative for congestion.   Respiratory: Negative for cough and shortness of breath.   Cardiovascular: Negative for chest pain and leg swelling.  Endocrine: Negative for cold intolerance.  Musculoskeletal: Positive for arthralgias and back pain.  Allergic/Immunologic: Positive for environmental allergies.  All other systems reviewed and are negative.   All other systems reviewed and are negative.  The following is a summary of the past history medically, past history surgically, known current medicines, social history and family history.  This information is gathered electronically by the computer from prior information and documentation.  I review this each visit and have found including this information at this point in the chart is beneficial and informative.    Past Medical History:  Diagnosis Date  . Arthritis   . Bulging lumbar disc    pushing on sciatic nerve L4  . Carpal tunnel syndrome   . Depression 12/01/2015  . Dyslipidemia 12/05/2015   Will try diet and exercise first  . Hot flashes 12/01/2015  . Vitamin D deficiency 12/05/2015   Take vitamin D3 5000 iu daily    Past Surgical History:  Procedure Laterality Date  . ABDOMINAL HYSTERECTOMY    . CESAREAN SECTION    . COLONOSCOPY WITH PROPOFOL N/A 03/06/2018   Procedure: COLONOSCOPY  WITH PROPOFOL;  Surgeon: Daneil Dolin, MD;  [facility];  Service: Endoscopy;  Laterality: N/A;  1:30pm  . POLYPECTOMY  03/06/2018   Procedure: POLYPECTOMY;  Surgeon: Daneil Dolin, MD;  [facility];  Service: Endoscopy;;  cecal polyp cs, hepatic flexure polyps  . SALPINGOOPHORECTOMY Left     Family History  Problem Relation Age of Onset  . Cancer Mother        lung  . Cancer Father        lung  . Hypertension Father   . Hypertension Brother   . Aneurysm Paternal Grandmother        brain  . Heart disease Paternal Grandfather        heart attack  . Hypertension Sister   . Colon cancer Paternal Aunt        less than age 84    Social History Social History   Tobacco Use  . Smoking status: Current Every Day Smoker    Packs/day: 0.25    Years: 33.00    Pack years: 8.25    Types: Cigarettes  . Smokeless tobacco: Never Used  Substance Use Topics  . Alcohol use: No  . Drug use: No    Allergies  Allergen Reactions  . Aspirin Hives  . Gabapentin     Upsets stomach    Current Outpatient Medications  Medication Sig Dispense Refill  . Cholecalciferol (VITAMIN D-3) 5000 units TABS Take 1 tablet by mouth daily.     Marland Kitchen  citalopram (CELEXA) 20 MG tablet TAKE 1 TABLET(20 MG) BY MOUTH DAILY 30 tablet 6  . diphenhydrAMINE (BENADRYL) 25 mg capsule Take 25 mg by mouth daily as needed for allergies.     Marland Kitchen gabapentin (NEURONTIN) 300 MG capsule Take 1 capsule (300 mg total) by mouth 3 (three) times daily. 90 capsule 1  . HYDROcodone-acetaminophen (NORCO) 7.5-325 MG tablet Take 1 tablet by mouth every 6 (six) hours as needed for moderate pain. 90 tablet 0  . Menthol, Topical Analgesic, (BENGAY EX) Apply 1 application topically daily as needed (muscle pain).    Marland Kitchen tiZANidine (ZANAFLEX) 4 MG tablet One by mouth every night before bed as needed for spasm 30 tablet 1   No current facility-administered medications for this visit.     Physical Exam  Blood pressure  (!) 142/96, pulse 86, height 5\' 4"  (1.626 m), weight 174 lb (78.9 kg).  Constitutional: overall normal hygiene, normal nutrition, well developed, normal grooming, normal body habitus. Assistive device:none  Musculoskeletal: gait and station Limp none, muscle tone and strength are normal, no tremors or atrophy is present.  .  Neurological: coordination overall normal.  Deep tendon reflex/nerve stretch intact.  Sensation normal.  Cranial nerves II-XII intact.   Skin:   Normal overall no scars, lesions, ulcers or rashes. No psoriasis.  Psychiatric: Alert and oriented x 3.  Recent memory intact, remote memory unclear.  Normal mood and affect. Well groomed.  Good eye contact.  Cardiovascular: overall no swelling, no varicosities, no edema bilaterally, normal temperatures of the legs and arms, no clubbing, cyanosis and good capillary refill.  Lymphatic: palpation is normal.  Spine/Pelvis examination:  Inspection:  Overall, sacoiliac joint benign and hips nontender; without crepitus or defects.   Thoracic spine inspection: Alignment normal without kyphosis present   Lumbar spine inspection:  Alignment  with normal lumbar lordosis, without scoliosis apparent.   Thoracic spine palpation:  without tenderness of spinal processes   Lumbar spine palpation: without tenderness of lumbar area; without tightness of lumbar muscles    Range of Motion:   Lumbar flexion, forward flexion is normal without pain or tenderness    Lumbar extension is full without pain or tenderness   Left lateral bend is normal without pain or tenderness   Right lateral bend is normal without pain or tenderness   Straight leg raising is normal  Strength & tone: normal   Stability overall normal stability  All other systems reviewed and are negative   The patient has been educated about the nature of the problem(s) and counseled on treatment options.  The patient appeared to understand what I have discussed and is in  agreement with it.  Encounter Diagnoses  Name Primary?  . Chronic left-sided low back pain with left-sided sciatica Yes  . Cigarette nicotine dependence without complication   . Chronic pain of multiple joints     PLAN Call if any problems.  Precautions discussed.  Continue current medications.   Return to clinic 6 weeks   Electronically Signed Sanjuana Kava, MD 5/13/202110:08 AM

## 2020-03-16 ENCOUNTER — Telehealth: Payer: Self-pay

## 2020-03-16 NOTE — Telephone Encounter (Signed)
Hydrocodone-Acetaminophen 7.5/325 mg. Qty 90 Tablets   PATIENT USES [facility] IN [place]

## 2020-03-17 MED ORDER — HYDROCODONE-ACETAMINOPHEN 7.5-325 MG PO TABS: 1.0000 | ORAL_TABLET | Freq: Four times a day (QID) | ORAL | 0 refills | Status: DC | PRN

## 2020-03-23 ENCOUNTER — Ambulatory Visit: Payer: 59 | Admitting: Orthopaedic Surgery

## 2020-04-14 ENCOUNTER — Telehealth: Payer: Self-pay | Admitting: Adult Health

## 2020-04-14 ENCOUNTER — Telehealth: Payer: Self-pay | Admitting: Orthopaedic Surgery

## 2020-04-14 MED ORDER — GABAPENTIN 300 MG PO CAPS: 300.0000 mg | ORAL_CAPSULE | Freq: Three times a day (TID) | ORAL | 1 refills | Status: DC

## 2020-04-14 MED ORDER — HYDROCODONE-ACETAMINOPHEN 7.5-325 MG PO TABS: 1.0000 | ORAL_TABLET | Freq: Four times a day (QID) | ORAL | 0 refills | Status: DC | PRN

## 2020-04-14 NOTE — Addendum Note (Signed)
Addended by: Derrek Monaco A on: 04/14/2020 11:55 AM   Modules accepted: Orders

## 2020-04-14 NOTE — Telephone Encounter (Signed)
Patient called stating that she has contacted her pharmacy and they sent over a refill of her gabapentin and her pharmacy states that they has not received the medication refill back. Please contact pt

## 2020-04-14 NOTE — Telephone Encounter (Signed)
Refilled gabapentin

## 2020-04-14 NOTE — Telephone Encounter (Signed)
Patient requests refill on Hydrocodone/Acetaminophen 7.5-325  Mgs.  Qty  110      Sig: Take 1 tablet by mouth every 6 (six) hours as needed for moderate pain.   Patient uses [facility] in [place]

## 2020-04-14 NOTE — Telephone Encounter (Signed)
Pt needs a refill on Gabapentin. Thanks!! [place]

## 2020-04-18 ENCOUNTER — Other Ambulatory Visit (HOSPITAL_COMMUNITY): Payer: Self-pay | Admitting: Physician Assistant

## 2020-04-18 DIAGNOSIS — D3500 Benign neoplasm of unspecified adrenal gland: Secondary | ICD-10-CM

## 2020-04-20 ENCOUNTER — Encounter (HOSPITAL_COMMUNITY): Payer: Self-pay

## 2020-04-20 ENCOUNTER — Ambulatory Visit (HOSPITAL_COMMUNITY): Admission: RE | Admit: 2020-04-20 | Payer: 59 | Source: Ambulatory Visit

## 2020-04-21 ENCOUNTER — Encounter: Payer: Self-pay | Admitting: Orthopaedic Surgery

## 2020-04-21 ENCOUNTER — Other Ambulatory Visit: Payer: Self-pay

## 2020-04-21 ENCOUNTER — Ambulatory Visit (INDEPENDENT_AMBULATORY_CARE_PROVIDER_SITE_OTHER): Payer: 59 | Admitting: Orthopaedic Surgery

## 2020-04-21 ENCOUNTER — Ambulatory Visit: Payer: 59

## 2020-04-21 VITALS — BP 133/82 | HR 81 | Ht 64.0 in | Wt 172.0 lb

## 2020-04-21 DIAGNOSIS — M5442 Lumbago with sciatica, left side: Secondary | ICD-10-CM

## 2020-04-21 DIAGNOSIS — M542 Cervicalgia: Secondary | ICD-10-CM | POA: Diagnosis not present

## 2020-04-21 DIAGNOSIS — M2550 Pain in unspecified joint: Secondary | ICD-10-CM | POA: Diagnosis not present

## 2020-04-21 DIAGNOSIS — F17210 Nicotine dependence, cigarettes, uncomplicated: Secondary | ICD-10-CM | POA: Diagnosis not present

## 2020-04-21 DIAGNOSIS — G8929 Other chronic pain: Secondary | ICD-10-CM

## 2020-04-21 NOTE — Progress Notes (Signed)
Patient Judith Rodriguez, female DOB:02-02-1966, 54 y.o. [id number]  Chief Complaint  Patient presents with  . Back Pain  . Neck Pain  . Shoulder Pain    left     HPI  Judith Rodriguez is a 54 y.o. female who has chronic lower back pain that is stable.  She is taking her medicine and doing her exercises.  She has no new trauma.  She has also developed pain in the neck area, more on the left and the left trapezius.  She has no paresthesias or trauma.  She has tried ice, heat and rubs.  She is on pain medicine and Neurontin.   Body mass index is 29.52 kg/m.  ROS  Review of Systems  Constitutional:       Patient does not have Diabetes Mellitus. Patient does not have hypertension. Patient does not have COPD or shortness of breath. Patient does not have BMI > 35. Patient has current smoking history.  HENT: Negative for congestion.   Respiratory: Negative for cough and shortness of breath.   Cardiovascular: Negative for chest pain and leg swelling.  Endocrine: Negative for cold intolerance.  Musculoskeletal: Positive for arthralgias and back pain.  Allergic/Immunologic: Positive for environmental allergies.  All other systems reviewed and are negative.   All other systems reviewed and are negative.  The following is a summary of the past history medically, past history surgically, known current medicines, social history and family history.  This information is gathered electronically by the computer from prior information and documentation.  I review this each visit and have found including this information at this point in the chart is beneficial and informative.    Past Medical History:  Diagnosis Date  . Arthritis   . Bulging lumbar disc    pushing on sciatic nerve L4  . Carpal tunnel syndrome   . Depression 12/01/2015  . Dyslipidemia 12/05/2015   Will try diet and exercise first  . Hot flashes 12/01/2015  . Vitamin D deficiency 12/05/2015   Take vitamin D3 5000 iu daily    Past  Surgical History:  Procedure Laterality Date  . ABDOMINAL HYSTERECTOMY    . CESAREAN SECTION    . COLONOSCOPY WITH PROPOFOL N/A 03/06/2018   Procedure: COLONOSCOPY WITH PROPOFOL;  Surgeon: Daneil Dolin, MD;  [facility];  Service: Endoscopy;  Laterality: N/A;  1:30pm  . POLYPECTOMY  03/06/2018   Procedure: POLYPECTOMY;  Surgeon: Daneil Dolin, MD;  [facility];  Service: Endoscopy;;  cecal polyp cs, hepatic flexure polyps  . SALPINGOOPHORECTOMY Left     Family History  Problem Relation Age of Onset  . Cancer Mother        lung  . Cancer Father        lung  . Hypertension Father   . Hypertension Brother   . Aneurysm Paternal Grandmother        brain  . Heart disease Paternal Grandfather        heart attack  . Hypertension Sister   . Colon cancer Paternal Aunt        less than age 70    Social History Social History   Tobacco Use  . Smoking status: Current Every Day Smoker    Packs/day: 0.25    Years: 33.00    Pack years: 8.25    Types: Cigarettes  . Smokeless tobacco: Never Used  Vaping Use  . Vaping Use: Never used  Substance Use Topics  . Alcohol use: No  .  Drug use: No    Allergies  Allergen Reactions  . Aspirin Hives  . Gabapentin     Upsets stomach    Current Outpatient Medications  Medication Sig Dispense Refill  . Cholecalciferol (VITAMIN D-3) 5000 units TABS Take 1 tablet by mouth daily.     . citalopram (CELEXA) 20 MG tablet TAKE 1 TABLET(20 MG) BY MOUTH DAILY 30 tablet 6  . diphenhydrAMINE (BENADRYL) 25 mg capsule Take 25 mg by mouth daily as needed for allergies.     Marland Kitchen gabapentin (NEURONTIN) 300 MG capsule Take 1 capsule (300 mg total) by mouth 3 (three) times daily. 90 capsule 1  . HYDROcodone-acetaminophen (NORCO) 7.5-325 MG tablet Take 1 tablet by mouth every 6 (six) hours as needed for moderate pain. 110 tablet 0  . Menthol, Topical Analgesic, (BENGAY EX) Apply 1 application topically daily as needed (muscle pain).     Marland Kitchen tiZANidine (ZANAFLEX) 4 MG tablet One by mouth every night before bed as needed for spasm 30 tablet 1   No current facility-administered medications for this visit.     Physical Exam  Blood pressure 133/82, pulse 81, height 5\' 4"  (1.626 m), weight 172 lb (78 kg).  Constitutional: overall normal hygiene, normal nutrition, well developed, normal grooming, normal body habitus. Assistive device:none  Musculoskeletal: gait and station Limp none, muscle tone and strength are normal, no tremors or atrophy is present.  .  Neurological: coordination overall normal.  Deep tendon reflex/nerve stretch intact.  Sensation normal.  Cranial nerves II-XII intact.   Skin:   Normal overall no scars, lesions, ulcers or rashes. No psoriasis.  Psychiatric: Alert and oriented x 3.  Recent memory intact, remote memory unclear.  Normal mood and affect. Well groomed.  Good eye contact.  Cardiovascular: overall no swelling, no varicosities, no edema bilaterally, normal temperatures of the legs and arms, no clubbing, cyanosis and good capillary refill.  Lymphatic: palpation is normal.  Spine/Pelvis examination:  Inspection:  Overall, sacoiliac joint benign and hips nontender; without crepitus or defects.   Thoracic spine inspection: Alignment normal without kyphosis present   Lumbar spine inspection:  Alignment  with normal lumbar lordosis, without scoliosis apparent.   Thoracic spine palpation:  without tenderness of spinal processes   Lumbar spine palpation: without tenderness of lumbar area; without tightness of lumbar muscles    Range of Motion:   Lumbar flexion, forward flexion is normal without pain or tenderness    Lumbar extension is full without pain or tenderness   Left lateral bend is normal without pain or tenderness   Right lateral bend is normal without pain or tenderness   Straight leg raising is normal  Strength & tone: normal   Stability overall normal stability  Neck has full  ROM but pain in the neck more on the left and some tightness of left trapezius but no spasm.  ROM of the left shoulder is full.  NV intact. All other systems reviewed and are negative   The patient has been educated about the nature of the problem(s) and counseled on treatment options.  The patient appeared to understand what I have discussed and is in agreement with it.  X-rays were done of the cervical spine, reported separately.  Encounter Diagnoses  Name Primary?  . Neck pain on left side Yes  . Chronic left-sided low back pain with left-sided sciatica   . Cigarette nicotine dependence without complication   . Chronic pain of multiple joints    I offered referral to  PT but she will do things at home.  PLAN Call if any problems.  Precautions discussed.  Continue current medications.   Return to clinic 3 months   Electronically Signed Sanjuana Kava, MD 6/24/20219:36 AM

## 2020-04-21 NOTE — Patient Instructions (Signed)
Steps to Quit Smoking Smoking tobacco is the leading cause of preventable death. It can affect almost every organ in the body. Smoking puts you and people around you at risk for many serious, long-lasting (chronic) diseases. Quitting smoking can be hard, but it is one of the best things that you can do for your health. It is never too late to quit. How do I get ready to quit? When you decide to quit smoking, make a plan to help you succeed. Before you quit:  Pick a date to quit. Set a date within the next 2 weeks to give you time to prepare.  Write down the reasons why you are quitting. Keep this list in places where you will see it often.  Tell your family, friends, and co-workers that you are quitting. Their support is important.  Talk with your doctor about the choices that may help you quit.  Find out if your health insurance will pay for these treatments.  Know the people, places, things, and activities that make you want to smoke (triggers). Avoid them. What first steps can I take to quit smoking?  Throw away all cigarettes at home, at work, and in your car.  Throw away the things that you use when you smoke, such as ashtrays and lighters.  Clean your car. Make sure to empty the ashtray.  Clean your home, including curtains and carpets. What can I do to help me quit smoking? Talk with your doctor about taking medicines and seeing a counselor at the same time. You are more likely to succeed when you do both.  If you are pregnant or breastfeeding, talk with your doctor about counseling or other ways to quit smoking. Do not take medicine to help you quit smoking unless your doctor tells you to do so. To quit smoking: Quit right away  Quit smoking totally, instead of slowly cutting back on how much you smoke over a period of time.  Go to counseling. You are more likely to quit if you go to counseling sessions regularly. Take medicine You may take medicines to help you quit. Some  medicines need a prescription, and some you can buy over-the-counter. Some medicines may contain a drug called nicotine to replace the nicotine in cigarettes. Medicines may:  Help you to stop having the desire to smoke (cravings).  Help to stop the problems that come when you stop smoking (withdrawal symptoms). Your doctor may ask you to use:  Nicotine patches, gum, or lozenges.  Nicotine inhalers or sprays.  Non-nicotine medicine that is taken by mouth. Find resources Find resources and other ways to help you quit smoking and remain smoke-free after you quit. These resources are most helpful when you use them often. They include:  Online chats with a counselor.  Phone quitlines.  Printed self-help materials.  Support groups or group counseling.  Text messaging programs.  Mobile phone apps. Use apps on your mobile phone or tablet that can help you stick to your quit plan. There are many free apps for mobile phones and tablets as well as websites. Examples include Quit Guide from the CDC and smokefree.gov  What things can I do to make it easier to quit?   Talk to your family and friends. Ask them to support and encourage you.  Call a phone quitline (1-800-QUIT-NOW), reach out to support groups, or work with a counselor.  Ask people who smoke to not smoke around you.  Avoid places that make you want to smoke,   such as: ? Bars. ? Parties. ? Smoke-break areas at work.  Spend time with people who do not smoke.  Lower the stress in your life. Stress can make you want to smoke. Try these things to help your stress: ? Getting regular exercise. ? Doing deep-breathing exercises. ? Doing yoga. ? Meditating. ? Doing a body scan. To do this, close your eyes, focus on one area of your body at a time from head to toe. Notice which parts of your body are tense. Try to relax the muscles in those areas. How will I feel when I quit smoking? Day 1 to 3 weeks Within the first 24 hours,  you may start to have some problems that come from quitting tobacco. These problems are very bad 2-3 days after you quit, but they do not often last for more than 2-3 weeks. You may get these symptoms:  Mood swings.  Feeling restless, nervous, angry, or annoyed.  Trouble concentrating.  Dizziness.  Strong desire for high-sugar foods and nicotine.  Weight gain.  Trouble pooping (constipation).  Feeling like you may vomit (nausea).  Coughing or a sore throat.  Changes in how the medicines that you take for other issues work in your body.  Depression.  Trouble sleeping (insomnia). Week 3 and afterward After the first 2-3 weeks of quitting, you may start to notice more positive results, such as:  Better sense of smell and taste.  Less coughing and sore throat.  Slower heart rate.  Lower blood pressure.  Clearer skin.  Better breathing.  Fewer sick days. Quitting smoking can be hard. Do not give up if you fail the first time. Some people need to try a few times before they succeed. Do your best to stick to your quit plan, and talk with your doctor if you have any questions or concerns. Summary  Smoking tobacco is the leading cause of preventable death. Quitting smoking can be hard, but it is one of the best things that you can do for your health.  When you decide to quit smoking, make a plan to help you succeed.  Quit smoking right away, not slowly over a period of time.  When you start quitting, seek help from your doctor, family, or friends. This information is not intended to replace advice given to you by your health care provider. Make sure you discuss any questions you have with your health care provider. Document Revised: 07/10/2019 Document Reviewed: 01/03/2019 Elsevier Patient Education  2020 [employer]  

## 2020-05-12 ENCOUNTER — Telehealth: Payer: Self-pay | Admitting: Orthopaedic Surgery

## 2020-05-12 MED ORDER — HYDROCODONE-ACETAMINOPHEN 7.5-325 MG PO TABS: 1.0000 | ORAL_TABLET | Freq: Four times a day (QID) | ORAL | 0 refills | Status: DC | PRN

## 2020-05-12 NOTE — Telephone Encounter (Signed)
Patient requests refill: HYDROcodone-acetaminophen (NORCO) 7.5-325 MG tablet 110 tablet 0  -Data processing manager, [employer]

## 2020-05-17 ENCOUNTER — Other Ambulatory Visit: Payer: Self-pay | Admitting: Adult Health

## 2020-06-09 ENCOUNTER — Telehealth: Payer: Self-pay | Admitting: Orthopaedic Surgery

## 2020-06-09 MED ORDER — HYDROCODONE-ACETAMINOPHEN 7.5-325 MG PO TABS: 1.0000 | ORAL_TABLET | Freq: Four times a day (QID) | ORAL | 0 refills | Status: DC | PRN

## 2020-06-09 MED ORDER — PREDNISONE 5 MG (21) PO TBPK: ORAL_TABLET | ORAL | 0 refills | Status: DC

## 2020-06-09 NOTE — Telephone Encounter (Signed)
Patient requests refill on Prednisone 5 mgs.  Qty 21      Sig: Take 6 pills first day; 5 pills second day; 4 pills third day; 3 pills fourth day; 2 pills next day and 1 pill last day.   Patient states she uses [facility] in [place]

## 2020-06-09 NOTE — Telephone Encounter (Signed)
Patient requests refill on Hydrocodone/Acetaminophen 7.5-325  Mgs.  Qty 110  Sig: Take 1 tablet by mouth every 6 (six) hours as needed for moderate pain.  Patient states she uses [facility]

## 2020-07-02 ENCOUNTER — Other Ambulatory Visit: Payer: Self-pay | Admitting: Adult Health

## 2020-07-07 ENCOUNTER — Telehealth: Payer: Self-pay | Admitting: Orthopaedic Surgery

## 2020-07-07 MED ORDER — HYDROCODONE-ACETAMINOPHEN 7.5-325 MG PO TABS: 1.0000 | ORAL_TABLET | Freq: Four times a day (QID) | ORAL | 0 refills | Status: DC | PRN

## 2020-07-07 NOTE — Telephone Encounter (Signed)
Patient requests refill on Hydrocodone/Acetaminophen 7.5-325  Mgs.  Qty 110  ANV:BTYO 1 tablet by mouth every 6 (six) hours as needed for moderate pain.  [facility] states [facility] uses [facility]

## 2020-07-08 ENCOUNTER — Telehealth: Payer: Self-pay

## 2020-07-08 MED ORDER — GABAPENTIN 300 MG PO CAPS: 300.0000 mg | ORAL_CAPSULE | Freq: Three times a day (TID) | ORAL | 1 refills | Status: DC

## 2020-07-08 NOTE — Telephone Encounter (Signed)
Pt aware rx sent to [facility]. 

## 2020-07-08 NOTE — Telephone Encounter (Signed)
Refilled gabapentin

## 2020-07-08 NOTE — Telephone Encounter (Signed)
Pt needing a refill on her gabapentin Rx

## 2020-07-21 ENCOUNTER — Encounter: Payer: Self-pay | Admitting: Orthopaedic Surgery

## 2020-07-21 ENCOUNTER — Ambulatory Visit (INDEPENDENT_AMBULATORY_CARE_PROVIDER_SITE_OTHER): Payer: 59 | Admitting: Orthopaedic Surgery

## 2020-07-21 ENCOUNTER — Other Ambulatory Visit: Payer: Self-pay

## 2020-07-21 VITALS — BP 145/89 | HR 83 | Ht 64.0 in | Wt 172.0 lb

## 2020-07-21 DIAGNOSIS — M25561 Pain in right knee: Secondary | ICD-10-CM | POA: Diagnosis not present

## 2020-07-21 DIAGNOSIS — M25562 Pain in left knee: Secondary | ICD-10-CM

## 2020-07-21 DIAGNOSIS — M25551 Pain in right hip: Secondary | ICD-10-CM | POA: Diagnosis not present

## 2020-07-21 DIAGNOSIS — G8929 Other chronic pain: Secondary | ICD-10-CM | POA: Diagnosis not present

## 2020-07-21 DIAGNOSIS — M25552 Pain in left hip: Secondary | ICD-10-CM

## 2020-07-21 DIAGNOSIS — M5442 Lumbago with sciatica, left side: Secondary | ICD-10-CM

## 2020-07-21 NOTE — Progress Notes (Signed)
Patient Judith Rodriguez, female DOB:02/20/1966, 54 y.o. LXB:[id number]  Chief Complaint  Patient presents with  . Back Pain    LBP  . Shoulder Pain    bil shld pain, s/p fx's and surg    HPI  Judith Rodriguez is a 54 y.o. female who has lower back pain and some shoulder pain bilaterally.  She has good and bad days for the lower back.  She has no weakness, no trauma.  She is taking her medicine.  The shoulders have more pain with overhead use.   Body mass index is 29.52 kg/m.  ROS  Review of Systems  Constitutional:       Patient does not have Diabetes Mellitus. Patient does not have hypertension. Patient does not have COPD or shortness of breath. Patient does not have BMI > 35. Patient has current smoking history.  HENT: Negative for congestion.   Respiratory: Negative for cough and shortness of breath.   Cardiovascular: Negative for chest pain and leg swelling.  Endocrine: Negative for cold intolerance.  Musculoskeletal: Positive for arthralgias and back pain.  Allergic/Immunologic: Positive for environmental allergies.  All other systems reviewed and are negative.   All other systems reviewed and are negative.  The following is a summary of the past history medically, past history surgically, known current medicines, social history and family history.  This information is gathered electronically by the computer from prior information and documentation.  I review this each visit and have found including this information at this point in the chart is beneficial and informative.    Past Medical History:  Diagnosis Date  . Arthritis   . Bulging lumbar disc    pushing on sciatic nerve L4  . Carpal tunnel syndrome   . Depression 12/01/2015  . Dyslipidemia 12/05/2015   Will try diet and exercise first  . Hot flashes 12/01/2015  . Vitamin D deficiency 12/05/2015   Take vitamin D3 5000 iu daily    Past Surgical History:  Procedure Laterality Date  . ABDOMINAL HYSTERECTOMY    .  CESAREAN SECTION    . COLONOSCOPY WITH PROPOFOL N/A 03/06/2018   Procedure: COLONOSCOPY WITH PROPOFOL;  Surgeon: Daneil Dolin, MD;  [facility]: [facility];  Service: Endoscopy;  Laterality: N/A;  1:30pm  . POLYPECTOMY  03/06/2018   Procedure: POLYPECTOMY;  Surgeon: Daneil Dolin, MD;  Location: [facility];  Service: Endoscopy;;  cecal polyp cs, hepatic flexure polyps  . SALPINGOOPHORECTOMY Left     Family History  Problem Relation Age of Onset  . Cancer Mother        lung  . Cancer Father        lung  . Hypertension Father   . Hypertension Brother   . Aneurysm Paternal Grandmother        brain  . Heart disease Paternal Grandfather        heart attack  . Hypertension Sister   . Colon cancer Paternal Aunt        less than age 46    Social History Social History   Tobacco Use  . Smoking status: Current Every Day Smoker    Packs/day: 0.25    Years: 33.00    Pack years: 8.25    Types: Cigarettes  . Smokeless tobacco: Never Used  Vaping Use  . Vaping Use: Never used  Substance Use Topics  . Alcohol use: No  . Drug use: No    Allergies  Allergen Reactions  . Aspirin Hives  .  Gabapentin     Upsets stomach    Current Outpatient Medications  Medication Sig Dispense Refill  . Cholecalciferol (VITAMIN D-3) 5000 units TABS Take 1 tablet by mouth daily.     . citalopram (CELEXA) 20 MG tablet TAKE 1 TABLET(20 MG) BY MOUTH DAILY 30 tablet 6  . diphenhydrAMINE (BENADRYL) 25 mg capsule Take 25 mg by mouth daily as needed for allergies.     Marland Kitchen gabapentin (NEURONTIN) 300 MG capsule Take 1 capsule (300 mg total) by mouth 3 (three) times daily. 90 capsule 1  . HYDROcodone-acetaminophen (NORCO) 7.5-325 MG tablet Take 1 tablet by mouth every 6 (six) hours as needed for moderate pain. 110 tablet 0  . Menthol, Topical Analgesic, (BENGAY EX) Apply 1 application topically daily as needed (muscle pain).    . predniSONE (STERAPRED UNI-PAK 21 TAB) 5 MG (21) TBPK tablet Take 6 pills  first day; 5 pills second day; 4 pills third day; 3 pills fourth day; 2 pills next day and 1 pill last day. 21 tablet 0  . tiZANidine (ZANAFLEX) 4 MG tablet One by mouth every night before bed as needed for spasm 30 tablet 1   No current facility-administered medications for this visit.     Physical Exam  Blood pressure (!) 145/89, pulse 83, height 5\' 4"  (1.626 m), weight 172 lb (78 kg).  Constitutional: overall normal hygiene, normal nutrition, well developed, normal grooming, normal body habitus. Assistive device:none  Musculoskeletal: gait and station Limp none, muscle tone and strength are normal, no tremors or atrophy is present.  .  Neurological: coordination overall normal.  Deep tendon reflex/nerve stretch intact.  Sensation normal.  Cranial nerves II-XII intact.   Skin:   Normal overall no scars, lesions, ulcers or rashes. No psoriasis.  Psychiatric: Alert and oriented x 3.  Recent memory intact, remote memory unclear.  Normal mood and affect. Well groomed.  Good eye contact.  Cardiovascular: overall no swelling, no varicosities, no edema bilaterally, normal temperatures of the legs and arms, no clubbing, cyanosis and good capillary refill.  Lymphatic: palpation is normal.  Spine/Pelvis examination:  Inspection:  Overall, sacoiliac joint benign and hips nontender; without crepitus or defects.   Thoracic spine inspection: Alignment normal without kyphosis present   Lumbar spine inspection:  Alignment  with normal lumbar lordosis, without scoliosis apparent.   Thoracic spine palpation:  without tenderness of spinal processes   Lumbar spine palpation: without tenderness of lumbar area; without tightness of lumbar muscles    Range of Motion:   Lumbar flexion, forward flexion is normal without pain or tenderness    Lumbar extension is full without pain or tenderness   Left lateral bend is normal without pain or tenderness   Right lateral bend is normal without pain or  tenderness   Straight leg raising is normal  Strength & tone: normal   Stability overall normal stability All other systems reviewed and are negative   The patient has been educated about the nature of the problem(s) and counseled on treatment options.  The patient appeared to understand what I have discussed and is in agreement with it.  Encounter Diagnoses  Name Primary?  . Chronic left-sided low back pain with left-sided sciatica Yes  . Chronic arthralgias of knees and hips     PLAN Call if any problems.  Precautions discussed.  Continue current medications.   Return to clinic 3 months   Electronically Signed Sanjuana Kava, MD 9/23/20219:11 AM

## 2020-08-03 ENCOUNTER — Telehealth: Payer: Self-pay | Admitting: Orthopaedic Surgery

## 2020-08-03 NOTE — Telephone Encounter (Signed)
Patient called for refill: °HYDROcodone-acetaminophen (NORCO) 7.5-325 MG tablet 110 tablet  °-[facility] [place] ° °

## 2020-08-04 MED ORDER — HYDROCODONE-ACETAMINOPHEN 7.5-325 MG PO TABS
1.0000 | ORAL_TABLET | Freq: Four times a day (QID) | ORAL | 0 refills | Status: DC | PRN
Start: 2020-08-04 — End: 2020-09-06

## 2020-08-15 DIAGNOSIS — M7521 Bicipital tendinitis, right shoulder: Secondary | ICD-10-CM | POA: Insufficient documentation

## 2020-08-26 ENCOUNTER — Ambulatory Visit: Payer: 59 | Admitting: Gastroenterology

## 2020-09-06 ENCOUNTER — Telehealth: Payer: Self-pay | Admitting: Orthopaedic Surgery

## 2020-09-06 MED ORDER — HYDROCODONE-ACETAMINOPHEN 7.5-325 MG PO TABS
1.0000 | ORAL_TABLET | Freq: Four times a day (QID) | ORAL | 0 refills | Status: DC | PRN
Start: 2020-09-06 — End: 2020-10-05

## 2020-09-06 NOTE — Telephone Encounter (Signed)
Patient called for refill: HYDROcodone-acetaminophen (NORCO) 7.5-325 MG tablet 110 tablet  -[facility]

## 2020-09-07 ENCOUNTER — Telehealth: Payer: Self-pay | Admitting: Adult Health

## 2020-09-07 NOTE — Telephone Encounter (Signed)
Pt requesting refill on GABAPENTIN to be sent to [facility]  Please advise & notify pt

## 2020-09-07 NOTE — Telephone Encounter (Addendum)
Pt states Gabapentin caused upset stomach at first, but once she got used to it, stomach don't hurt anymore. Can you refill? Thanks!! [place]

## 2020-09-08 ENCOUNTER — Other Ambulatory Visit: Payer: Self-pay | Admitting: Advanced Practice Midwife

## 2020-09-08 MED ORDER — GABAPENTIN 300 MG PO CAPS: 300.0000 mg | ORAL_CAPSULE | Freq: Three times a day (TID) | ORAL | 1 refills | Status: DC

## 2020-09-08 NOTE — Progress Notes (Signed)
gabepentin refilled--Jennifer out on PAL.

## 2020-09-09 ENCOUNTER — Other Ambulatory Visit: Payer: Self-pay

## 2020-09-09 ENCOUNTER — Encounter: Payer: Self-pay | Admitting: Gastroenterology

## 2020-09-09 ENCOUNTER — Ambulatory Visit (INDEPENDENT_AMBULATORY_CARE_PROVIDER_SITE_OTHER): Payer: 59 | Admitting: Gastroenterology

## 2020-09-09 DIAGNOSIS — Z860101 Personal history of adenomatous and serrated colon polyps: Secondary | ICD-10-CM | POA: Insufficient documentation

## 2020-09-09 DIAGNOSIS — Z86010 Personal history of colonic polyps: Secondary | ICD-10-CM | POA: Diagnosis not present

## 2020-09-09 NOTE — Telephone Encounter (Signed)
Not sure why this came to me, it was filled yesterday!

## 2020-09-09 NOTE — Telephone Encounter (Signed)
Left message letting pt know med was sent to pharmacy. Judith Rodriguez

## 2020-09-09 NOTE — Progress Notes (Signed)
Primary Care Physician:  Rory Percy, MD  Primary Gastroenterologist:  Garfield Cornea, MD   Chief Complaint  Patient presents with  . Consult    TCS due for repeat    HPI:  Judith Rodriguez is a 54 y.o. female here to schedule surveillance colonoscopy.  Her last colonoscopy was in May 2019.  She had multiple tubular adenomas removed.  One was 30 mm in size, at the hepatic flexure, removed piecemeal.  She was advised to come back in 1 year for surveillance colonoscopy. Paternal aunt with history of colon cancer before age 22.  Since we last saw her she fell from a ladder while painting shower.  She only fell from the third step but she hit the shower seat and broke both shoulders.  The right shoulder had to be surgically repaired.  Has had issues with chronic pain from the shoulders as well as arthritis in multiple other joints.  BM regular. No melena, brbpr. No abdominal pain. No n/v. No heartburn. No dysphagia.    Current Outpatient Medications  Medication Sig Dispense Refill  . Cholecalciferol (VITAMIN D-3) 5000 units TABS Take 1 tablet by mouth daily.     . citalopram (CELEXA) 20 MG tablet TAKE 1 TABLET(20 MG) BY MOUTH DAILY 30 tablet 6  . diphenhydrAMINE (BENADRYL) 25 mg capsule Take 25 mg by mouth daily as needed for allergies.     Marland Kitchen gabapentin (NEURONTIN) 300 MG capsule Take 1 capsule (300 mg total) by mouth 3 (three) times daily. 90 capsule 1  . HYDROcodone-acetaminophen (NORCO) 7.5-325 MG tablet Take 1 tablet by mouth every 6 (six) hours as needed for moderate pain. 110 tablet 0  . Menthol, Topical Analgesic, (BENGAY EX) Apply 1 application topically daily as needed (muscle pain).    Marland Kitchen tiZANidine (ZANAFLEX) 4 MG tablet One by mouth every night before bed as needed for spasm 30 tablet 1   No current facility-administered medications for this visit.    Allergies as of 09/09/2020 - Review Complete 09/09/2020  Allergen Reaction Noted  . Aspirin Hives 10/20/2011    Past Medical  History:  Diagnosis Date  . Arthritis   . Bulging lumbar disc    pushing on sciatic nerve L4  . Carpal tunnel syndrome   . Depression 12/01/2015  . Dyslipidemia 12/05/2015   Will try diet and exercise first  . Hot flashes 12/01/2015  . Vitamin D deficiency 12/05/2015   Take vitamin D3 5000 iu daily    Past Surgical History:  Procedure Laterality Date  . ABDOMINAL HYSTERECTOMY    . CESAREAN SECTION    . COLONOSCOPY WITH PROPOFOL N/A 03/06/2018   Rourk: 4 colon polyps removed.  One was 30 mm at the hepatic flexure removed piecemeal.  Diverticulosis.  Nonbleeding internal hemorrhoids.  Pathology revealed tubular adenomas.  Advised for repeat colonoscopy in 1 year  . HUMERUS FRACTURE SURGERY Right 12/2019  . POLYPECTOMY  03/06/2018   Procedure: POLYPECTOMY;  Surgeon: Daneil Dolin, MD;  Location: [facility];  Service: Endoscopy;;  cecal polyp cs, hepatic flexure polyps  . SALPINGOOPHORECTOMY Left     Family History  Problem Relation Age of Onset  . Cancer Mother        lung  . Cancer Father        lung  . Hypertension Father   . Hypertension Brother   . Aneurysm Paternal Grandmother        brain  . Heart disease Paternal Grandfather  heart attack  . Hypertension Sister   . Colon cancer Paternal Aunt        less than age 63    Social History   Socioeconomic History  . Marital status: Married    Spouse name: Not on file  . Number of children: Not on file  . Years of education: Not on file  . Highest education level: Not on file  Occupational History  . Not on file  Tobacco Use  . Smoking status: Current Every Day Smoker    Packs/day: 0.25    Years: 33.00    Pack years: 8.25    Types: Cigarettes  . Smokeless tobacco: Never Used  Vaping Use  . Vaping Use: Never used  Substance and Sexual Activity  . Alcohol use: No  . Drug use: No  . Sexual activity: Not Currently    Birth control/protection: Surgical    Comment: hyst  Other Topics Concern  . Not on file   Social History Narrative  . Not on file   Social Determinants of Health   Financial Resource Strain:   . Difficulty of Paying Living Expenses: Not on file  Food Insecurity:   . Worried About Charity fundraiser in the Last Year: Not on file  . Ran Out of Food in the Last Year: Not on file  Transportation Needs:   . Lack of Transportation (Medical): Not on file  . Lack of Transportation (Non-Medical): Not on file  Physical Activity:   . Days of Exercise per Week: Not on file  . Minutes of Exercise per Session: Not on file  Stress:   . Feeling of Stress : Not on file  Social Connections:   . Frequency of Communication with Friends and Family: Not on file  . Frequency of Social Gatherings with Friends and Family: Not on file  . Attends Religious Services: Not on file  . Active Member of Clubs or Organizations: Not on file  . Attends Archivist Meetings: Not on file  . Marital Status: Not on file  Intimate Partner Violence:   . Fear of Current or Ex-Partner: Not on file  . Emotionally Abused: Not on file  . Physically Abused: Not on file  . Sexually Abused: Not on file      ROS:  General: Negative for anorexia, weight loss, fever, chills, fatigue, weakness. Eyes: Negative for vision changes.  ENT: Negative for hoarseness, difficulty swallowing , nasal congestion. CV: Negative for chest pain, angina, palpitations, dyspnea on exertion, peripheral edema.  Respiratory: Negative for dyspnea at rest, dyspnea on exertion, cough, sputum, wheezing.  GI: See history of present illness. GU:  Negative for dysuria, hematuria, urinary incontinence, urinary frequency, nocturnal urination.  MS: Positive for multiple joints with pain, back pain Derm: Negative for rash or itching.  Neuro: Negative for weakness, abnormal sensation, seizure, frequent headaches, memory loss, confusion.  Psych: Negative for anxiety, depression, suicidal ideation, hallucinations.  Endo: Negative for  unusual weight change.  Heme: Negative for bruising or bleeding. Allergy: Negative for rash or hives.    Physical Examination:  BP 124/73   Pulse 96   Temp (!) 97.1 F (36.2 C)   Ht 5\' 4"  (1.626 m)   Wt 175 lb (79.4 kg)   BMI 30.04 kg/m    General: Well-nourished, well-developed in no acute distress.  Head: Normocephalic, atraumatic.   Eyes: Conjunctiva pink, no icterus. Mouth: masked Neck: Supple without thyromegaly, masses, or lymphadenopathy.  Lungs: Clear to auscultation bilaterally.  Heart: Regular rate and rhythm, no murmurs rubs or gallops.  Abdomen: Bowel sounds are normal, nontender, nondistended, no hepatosplenomegaly or masses, no abdominal bruits or    hernia , no rebound or guarding.   Rectal: not performed Extremities: No lower extremity edema. No clubbing or deformities.  Neuro: Alert and oriented x 4 , grossly normal neurologically.  Skin: Warm and dry, no rash or jaundice.   Psych: Alert and cooperative, normal mood and affect.  Impression/plan:  54 year old female with history of adenomatous colon polyps, large polyp removed piecemeal in 2019, presenting for surveillance colonoscopy.  Family history significant for colon cancer, paternal aunt at a young age. Patient denies any GI symptoms.   Plan for colonoscopy in the near future.  Deep sedation due to polypharmacy. ASA II.  I have discussed the risks, alternatives, benefits with regards to but not limited to the risk of reaction to medication, bleeding, infection, perforation and the patient is agreeable to proceed. Written consent to be obtained.

## 2020-09-09 NOTE — Patient Instructions (Signed)
Colonoscopy as scheduled. See separate instructions.  

## 2020-10-04 ENCOUNTER — Telehealth: Payer: Self-pay | Admitting: Orthopaedic Surgery

## 2020-10-04 NOTE — Telephone Encounter (Signed)
Patient requests refill on Hydrocodone/Acetaminophen 7.5-325 mgs.   Qty  110  Sig: Take 1 tablet by mouth every 6 (six) hours as needed for moderate pain.  Patient states she uses Holiday representative in [place]

## 2020-10-05 MED ORDER — HYDROCODONE-ACETAMINOPHEN 7.5-325 MG PO TABS: 1.0000 | ORAL_TABLET | Freq: Four times a day (QID) | ORAL | 0 refills | Status: DC | PRN

## 2020-10-11 ENCOUNTER — Other Ambulatory Visit (HOSPITAL_COMMUNITY): Admission: RE | Admit: 2020-10-11 | Payer: 59 | Source: Ambulatory Visit

## 2020-10-11 ENCOUNTER — Other Ambulatory Visit: Payer: Self-pay

## 2020-10-11 ENCOUNTER — Other Ambulatory Visit (HOSPITAL_COMMUNITY)
Admission: RE | Admit: 2020-10-11 | Discharge: 2020-10-11 | Disposition: A | Discharge status: Home / Self Care | Payer: 59 | Source: Ambulatory Visit | Attending: Internal Medicine | Admitting: Internal Medicine

## 2020-10-11 DIAGNOSIS — Z01812 Encounter for preprocedural laboratory examination: Secondary | ICD-10-CM | POA: Diagnosis present

## 2020-10-11 DIAGNOSIS — Z20822 Contact with and (suspected) exposure to covid-19: Secondary | ICD-10-CM | POA: Insufficient documentation

## 2020-10-11 LAB — SARS CORONAVIRUS 2 (TAT 6-24 HRS): SARS Coronavirus 2: NEGATIVE

## 2020-10-13 ENCOUNTER — Ambulatory Visit (HOSPITAL_COMMUNITY): Payer: 59 | Admitting: Anesthesiology

## 2020-10-13 ENCOUNTER — Encounter (HOSPITAL_COMMUNITY): Payer: Self-pay | Admitting: Internal Medicine

## 2020-10-13 ENCOUNTER — Ambulatory Visit (HOSPITAL_COMMUNITY)
Admission: RE | Admit: 2020-10-13 | Discharge: 2020-10-13 | Disposition: A | Discharge status: Home / Self Care | Payer: 59 | Attending: Internal Medicine | Admitting: Internal Medicine

## 2020-10-13 ENCOUNTER — Encounter (HOSPITAL_COMMUNITY)
Admission: RE | Disposition: A | Discharge status: Home / Self Care | Payer: Self-pay | Source: Home / Self Care | Attending: Internal Medicine

## 2020-10-13 ENCOUNTER — Other Ambulatory Visit: Payer: Self-pay

## 2020-10-13 DIAGNOSIS — Z8249 Family history of ischemic heart disease and other diseases of the circulatory system: Secondary | ICD-10-CM | POA: Diagnosis not present

## 2020-10-13 DIAGNOSIS — K649 Unspecified hemorrhoids: Secondary | ICD-10-CM | POA: Diagnosis not present

## 2020-10-13 DIAGNOSIS — Z1211 Encounter for screening for malignant neoplasm of colon: Secondary | ICD-10-CM | POA: Diagnosis not present

## 2020-10-13 DIAGNOSIS — Z86010 Personal history of colonic polyps: Secondary | ICD-10-CM | POA: Insufficient documentation

## 2020-10-13 DIAGNOSIS — Z800 Family history of malignant neoplasm of digestive organs: Secondary | ICD-10-CM | POA: Insufficient documentation

## 2020-10-13 DIAGNOSIS — Z79899 Other long term (current) drug therapy: Secondary | ICD-10-CM | POA: Insufficient documentation

## 2020-10-13 DIAGNOSIS — Z801 Family history of malignant neoplasm of trachea, bronchus and lung: Secondary | ICD-10-CM | POA: Insufficient documentation

## 2020-10-13 DIAGNOSIS — K5730 Diverticulosis of large intestine without perforation or abscess without bleeding: Secondary | ICD-10-CM | POA: Diagnosis not present

## 2020-10-13 DIAGNOSIS — F17210 Nicotine dependence, cigarettes, uncomplicated: Secondary | ICD-10-CM | POA: Diagnosis not present

## 2020-10-13 DIAGNOSIS — Z886 Allergy status to analgesic agent status: Secondary | ICD-10-CM | POA: Insufficient documentation

## 2020-10-13 HISTORY — PX: COLONOSCOPY WITH PROPOFOL: SHX5780

## 2020-10-13 SURGERY — COLONOSCOPY WITH PROPOFOL
Anesthesia: General

## 2020-10-13 MED ORDER — PROPOFOL 10 MG/ML IV BOLUS
INTRAVENOUS | Status: DC | PRN
  Administered 2020-10-13: 100 mg via INTRAVENOUS
  Administered 2020-10-13: 30 mg via INTRAVENOUS

## 2020-10-13 MED ORDER — STERILE WATER FOR IRRIGATION IR SOLN
Status: DC | PRN
  Administered 2020-10-13: 1.5 mL

## 2020-10-13 MED ORDER — LACTATED RINGERS IV SOLN: INTRAVENOUS | Status: DC | PRN

## 2020-10-13 MED ORDER — LIDOCAINE HCL (CARDIAC) PF 100 MG/5ML IV SOSY
PREFILLED_SYRINGE | INTRAVENOUS | Status: DC | PRN
  Administered 2020-10-13: 100 mg via INTRAVENOUS

## 2020-10-13 MED ORDER — PROPOFOL 500 MG/50ML IV EMUL
INTRAVENOUS | Status: DC | PRN
  Administered 2020-10-13: 150 ug/kg/min via INTRAVENOUS

## 2020-10-13 MED ORDER — LACTATED RINGERS IV SOLN: INTRAVENOUS | Status: DC

## 2020-10-13 NOTE — H&P (Signed)
@LOGO @   Primary Care Physician:  Rory Percy, MD Primary Gastroenterologist:  Dr. Gala Romney  Pre-Procedure History & Physical: HPI:  Judith Rodriguez is a 54 y.o. female here for here for surveillance colonoscopy.  Multiple colonic adenomas-1 large (30 mm) removed 2019; she is overdue. Past Medical History:  Diagnosis Date  . Arthritis   . Bulging lumbar disc    pushing on sciatic nerve L4  . Carpal tunnel syndrome   . Depression 12/01/2015  . Dyslipidemia 12/05/2015   Will try diet and exercise first  . Hot flashes 12/01/2015  . Vitamin D deficiency 12/05/2015   Take vitamin D3 5000 iu daily    Past Surgical History:  Procedure Laterality Date  . ABDOMINAL HYSTERECTOMY    . CESAREAN SECTION    . COLONOSCOPY WITH PROPOFOL N/A 03/06/2018   Karmel Patricelli: 4 colon polyps removed.  One was 30 mm at the hepatic flexure removed piecemeal.  Diverticulosis.  Nonbleeding internal hemorrhoids.  Pathology revealed tubular adenomas.  Advised for repeat colonoscopy in 1 year  . HUMERUS FRACTURE SURGERY Right 12/2019  . POLYPECTOMY  03/06/2018   Procedure: POLYPECTOMY;  Surgeon: Daneil Dolin, MD;  [facility]: [facility];  Service: Endoscopy;;  cecal polyp cs, hepatic flexure polyps  . SALPINGOOPHORECTOMY Left     Prior to Admission medications   Medication Sig Start Date End Date Taking? Authorizing Provider  Cholecalciferol (VITAMIN D-3) 5000 units TABS Take 1 tablet by mouth daily.    Yes [provider]  diphenhydrAMINE (BENADRYL) 25 mg capsule Take 25 mg by mouth daily as needed for allergies.    Yes [provider]  HYDROcodone-acetaminophen (NORCO) 7.5-325 MG tablet Take 1 tablet by mouth every 6 (six) hours as needed for moderate pain. 10/05/20  Yes Sanjuana Kava, MD  tiZANidine (ZANAFLEX) 4 MG tablet One by mouth every night before bed as needed for spasm 12/02/19  Yes Sanjuana Kava, MD  citalopram (CELEXA) 20 MG tablet TAKE 1 TABLET(20 MG) BY MOUTH DAILY 05/17/20   Derrek Monaco A, NP   gabapentin (NEURONTIN) 300 MG capsule Take 1 capsule (300 mg total) by mouth 3 (three) times daily. 09/08/20   Cresenzo-Dishmon, Joaquim Lai, CNM  Menthol, Topical Analgesic, (BENGAY EX) Apply 1 application topically daily as needed (muscle pain).    [provider]    Allergies as of 09/09/2020 - Review Complete 09/09/2020  Allergen Reaction Noted  . Aspirin Hives 10/20/2011    Family History  Problem Relation Age of Onset  . Cancer Mother        lung  . Cancer Father        lung  . Hypertension Father   . Hypertension Brother   . Aneurysm Paternal Grandmother        brain  . Heart disease Paternal Grandfather        heart attack  . Hypertension Sister   . Colon cancer Paternal Aunt        less than age 36    Social History   Socioeconomic History  . Marital status: Married    Spouse name: Not on file  . Number of children: Not on file  . Years of education: Not on file  . Highest education level: Not on file  Occupational History  . Not on file  Tobacco Use  . Smoking status: Current Every Day Smoker    Packs/day: 0.25    Years: 33.00    Pack years: 8.25    Types: Cigarettes  . Smokeless tobacco:  Never Used  Vaping Use  . Vaping Use: Never used  Substance and Sexual Activity  . Alcohol use: No  . Drug use: No  . Sexual activity: Not Currently    Birth control/protection: Surgical    Comment: hyst  Other Topics Concern  . Not on file  Social History Narrative  . Not on file   Social Determinants of Health   Financial Resource Strain: Not on file  Food Insecurity: Not on file  Transportation Needs: Not on file  Physical Activity: Not on file  Stress: Not on file  Social Connections: Not on file  Intimate Partner Violence: Not on file    Review of Systems: See HPI, otherwise negative ROS  Physical Exam: BP 128/63   Pulse 67   Temp 98.2 F (36.8 C) (Oral)   Resp 11   Ht 5\' 4"  (1.626 m)   Wt 78 kg   SpO2 98%   BMI 29.52 kg/m   General:   Alert,  Well-developed, well-nourished, pleasant and cooperative in NAD SNeck:  Supple; no masses or thyromegaly. No significant cervical adenopathy. Lungs:  Clear throughout to auscultation.   No wheezes, crackles, or rhonchi. No acute distress. Heart:  Regular rate and rhythm; no murmurs, clicks, rubs,  or gallops. Abdomen: Non-distended, normal bowel sounds.  Soft and nontender without appreciable mass or hepatosplenomegaly.  Pulses:  Normal pulses noted. Extremities:  Without clubbing or edema.  Impression/Plan: 54 year old lady here for surveillance colonoscopy.  History colonic polyps. The risks, benefits, limitations, alternatives and imponderables have been reviewed with the patient. Questions have been answered. All parties are agreeable.      Notice: This dictation was prepared with Dragon dictation along with smaller phrase technology. Any transcriptional errors that result from this process are unintentional and may not be corrected upon review.

## 2020-10-13 NOTE — Anesthesia Preprocedure Evaluation (Signed)
Anesthesia Evaluation  Patient identified by MRN, date of birth, ID band Patient awake    Reviewed: Allergy & Precautions, H&P , NPO status , Patient's Chart, lab work & pertinent test results, reviewed documented beta blocker date and time   Airway Mallampati: II  TM Distance: >3 FB Neck ROM: full    Dental no notable dental hx. (+) Teeth Intact   Pulmonary neg pulmonary ROS, Current Smoker,    Pulmonary exam normal breath sounds clear to auscultation       Cardiovascular Exercise Tolerance: Good negative cardio ROS   Rhythm:regular Rate:Normal     Neuro/Psych PSYCHIATRIC DISORDERS Depression  Neuromuscular disease    GI/Hepatic negative GI ROS, Neg liver ROS,   Endo/Other  negative endocrine ROS  Renal/GU negative Renal ROS  negative genitourinary   Musculoskeletal   Abdominal   Peds  Hematology negative hematology ROS (+)   Anesthesia Other Findings   Reproductive/Obstetrics negative OB ROS                             Anesthesia Physical Anesthesia Plan  ASA: II  Anesthesia Plan: General   Post-op Pain Management:    Induction:   PONV Risk Score and Plan: Propofol infusion  Airway Management Planned:   Additional Equipment:   Intra-op Plan:   Post-operative Plan:   Informed Consent: I have reviewed the patients History and Physical, chart, labs and discussed the procedure including the risks, benefits and alternatives for the proposed anesthesia with the patient or authorized representative who has indicated his/her understanding and acceptance.     Dental Advisory Given  Plan Discussed with: CRNA  Anesthesia Plan Comments:         Anesthesia Quick Evaluation

## 2020-10-13 NOTE — Discharge Instructions (Signed)
Colonoscopy Discharge Instructions  Read the instructions outlined below and refer to this sheet in the next few weeks. These discharge instructions provide you with general information on caring for yourself after you leave the hospital. Your doctor may also give you specific instructions. While your treatment has been planned according to the most current medical practices available, unavoidable complications occasionally occur. If you have any problems or questions after discharge, call Dr. Gala Romney at [phone]. ACTIVITY  You may resume your regular activity, but move at a slower pace for the next 24 hours.   Take frequent rest periods for the next 24 hours.   Walking will help get rid of the air and reduce the bloated feeling in your belly (abdomen).   No driving for 24 hours (because of the medicine (anesthesia) used during the test).    Do not sign any important legal documents or operate any machinery for 24 hours (because of the anesthesia used during the test).  NUTRITION  Drink plenty of fluids.   You may resume your normal diet as instructed by your doctor.   Begin with a light meal and progress to your normal diet. Heavy or fried foods are harder to digest and may make you feel sick to your stomach (nauseated).   Avoid alcoholic beverages for 24 hours or as instructed.  MEDICATIONS  You may resume your normal medications unless your doctor tells you otherwise.  WHAT YOU CAN EXPECT TODAY  Some feelings of bloating in the abdomen.   Passage of more gas than usual.   Spotting of blood in your stool or on the toilet paper.  IF YOU HAD POLYPS REMOVED DURING THE COLONOSCOPY:  No aspirin products for 7 days or as instructed.   No alcohol for 7 days or as instructed.   Eat a soft diet for the next 24 hours.  FINDING OUT THE RESULTS OF YOUR TEST Not all test results are available during your visit. If your test results are not back during the visit, make an appointment  with your caregiver to find out the results. Do not assume everything is normal if you have not heard from your caregiver or the medical facility. It is important for you to follow up on all of your test results.  SEEK IMMEDIATE MEDICAL ATTENTION IF:  You have more than a spotting of blood in your stool.   Your belly is swollen (abdominal distention).   You are nauseated or vomiting.   You have a temperature over 101.   You have abdominal pain or discomfort that is severe or gets worse throughout the day.   Diverticulosis only found today  Repeat colonoscopy for surveillance purposes in 5 years   Diverticulosis  Diverticulosis is a condition that develops when small pouches (diverticula) form in the wall of the large intestine (colon). The colon is where water is absorbed and stool (feces) is formed. The pouches form when the inside layer of the colon pushes through weak spots in the outer layers of the colon. You may have a few pouches or many of them. The pouches usually do not cause problems unless they become inflamed or infected. When this happens, the condition is called diverticulitis. What are the causes? The cause of this condition is not known. What increases the risk? The following factors may make you more likely to develop this condition:  Being older than age 10. Your risk for this condition increases with age. Diverticulosis is rare among people younger than age 85.  By age 86, many people have it.  Eating a low-fiber diet.  Having frequent constipation.  Being overweight.  Not getting enough exercise.  Smoking.  Taking over-the-counter pain medicines, like aspirin and ibuprofen.  Having a family history of diverticulosis. What are the signs or symptoms? In most people, there are no symptoms of this condition. If you do have symptoms, they may include:  Bloating.  Cramps in the abdomen.  Constipation or diarrhea.  Pain in the lower left side of the  abdomen. How is this diagnosed? Because diverticulosis usually has no symptoms, it is most often diagnosed during an exam for other colon problems. The condition may be diagnosed by:  Using a flexible scope to examine the colon (colonoscopy).  Taking an X-ray of the colon after dye has been put into the colon (barium enema).  Having a CT scan. How is this treated? You may not need treatment for this condition. Your health care provider may recommend treatment to prevent problems. You may need treatment if you have symptoms or if you previously had diverticulitis. Treatment may include:  Eating a high-fiber diet.  Taking a fiber supplement.  Taking a live bacteria supplement (probiotic).  Taking medicine to relax your colon. Follow these instructions at home: Medicines  Take over-the-counter and prescription medicines only as told by your health care provider.  If told by your health care provider, take a fiber supplement or probiotic. Constipation prevention Your condition may cause constipation. To prevent or treat constipation, you may need to:  Drink enough fluid to keep your urine pale yellow.  Take over-the-counter or prescription medicines.  Eat foods that are high in fiber, such as beans, whole grains, and fresh fruits and vegetables.  Limit foods that are high in fat and processed sugars, such as fried or sweet foods.  General instructions  Try not to strain when you have a bowel movement.  Keep all follow-up visits as told by your health care provider. This is important. Contact a health care provider if you:  Have pain in your abdomen.  Have bloating.  Have cramps.  Have not had a bowel movement in 3 days. Get help right away if:  Your pain gets worse.  Your bloating becomes very bad.  You have a fever or chills, and your symptoms suddenly get worse.  You vomit.  You have bowel movements that are bloody or black.  You have bleeding from your  rectum. Summary  Diverticulosis is a condition that develops when small pouches (diverticula) form in the wall of the large intestine (colon).  You may have a few pouches or many of them.  This condition is most often diagnosed during an exam for other colon problems.  Treatment may include increasing the fiber in your diet, taking supplements, or taking medicines. This information is not intended to replace advice given to you by your health care provider. Make sure you discuss any questions you have with your health care provider. Document Revised: 05/14/2019 Document Reviewed: 05/14/2019 Elsevier Patient Education  [place].

## 2020-10-13 NOTE — Anesthesia Postprocedure Evaluation (Signed)
Anesthesia Post Note  Patient: Judith Rodriguez  Procedure(s) Performed: COLONOSCOPY WITH PROPOFOL (N/A )  Patient location during evaluation: Endoscopy Anesthesia Type: General and MAC Level of consciousness: awake and alert Pain management: pain level controlled Vital Signs Assessment: post-procedure vital signs reviewed and stable Respiratory status: spontaneous breathing Cardiovascular status: stable Postop Assessment: no apparent nausea or vomiting Anesthetic complications: no   No complications documented.   Last Vitals:  Vitals:   10/13/20 0657  BP: 128/63  Pulse: 67  Resp: 11  Temp: 36.8 C  SpO2: 98%    Last Pain:  Vitals:   10/13/20 0735  TempSrc:   PainSc: 6                  Everette Rank

## 2020-10-13 NOTE — Transfer of Care (Signed)
Immediate Anesthesia Transfer of Care Note  Patient: Judith Rodriguez  Procedure(s) Performed: COLONOSCOPY WITH PROPOFOL (N/A )  Patient Location: Endoscopy Unit  Anesthesia Type:MAC and General  Level of Consciousness: awake, alert , oriented and patient cooperative  Airway & Oxygen Therapy: Patient Spontanous Breathing  Post-op Assessment: Report given to RN and Post -op Vital signs reviewed and stable  Post vital signs: Reviewed and stable  Last Vitals:  Vitals Value Taken Time  BP    Temp    Pulse    Resp    SpO2      Last Pain:  Vitals:   10/13/20 0735  TempSrc:   PainSc: 6          Complications: No complications documented.

## 2020-10-13 NOTE — Op Note (Signed)
[facility] Patient Name: Judith Rodriguez Procedure Date: 10/13/2020 6:47 AM MRN: [id number] Date of Birth: 27-May-1966 Attending MD: Norvel Richards , MD CSN: [id number] Age: 54 Admit Type: Outpatient Procedure:                Colonoscopy Indications:              High risk colon cancer surveillance: Personal                            history of colonic polyps Providers:                Norvel Richards, MD, Charlsie Quest. Theda Sers RN, RN,                            Raphael Gibney, Technician Referring MD:              Medicines:                Propofol per Anesthesia Complications:            No immediate complications. Estimated Blood Loss:     Estimated blood loss: none. Procedure:                Pre-Anesthesia Assessment:                           - Prior to the procedure, a History and Physical                            was performed, and patient medications and                            allergies were reviewed. The patient's tolerance of                            previous anesthesia was also reviewed. The risks                            and benefits of the procedure and the sedation                            options and risks were discussed with the patient.                            All questions were answered, and informed consent                            was obtained. Prior Anticoagulants: The patient has                            taken no previous anticoagulant or antiplatelet                            agents. ASA Grade Assessment: II - A patient with  mild systemic disease. After reviewing the risks                            and benefits, the patient was deemed in                            satisfactory condition to undergo the procedure.                           After obtaining informed consent, the colonoscope                            was passed under direct vision. Throughout the                            procedure, the  patient's blood pressure, pulse, and                            oxygen saturations were monitored continuously. The                            CF-HQ190L ([id number]) scope was introduced through                            the anus and advanced to the the cecum, identified                            by appendiceal orifice and ileocecal valve. The                            colonoscopy was performed without difficulty. The                            patient tolerated the procedure well. The quality                            of the bowel preparation was adequate. Scope In: 7:41:51 AM Scope Out: 7:53:19 AM Scope Withdrawal Time: 0 hours 6 minutes 42 seconds  Total Procedure Duration: 0 hours 11 minutes 28 seconds  Findings:      Hemorrhoids were found on perianal exam.      Scattered small-mouthed diverticula were found in the entire colon.      The exam was otherwise without abnormality on direct and retroflexion       views. Impression:               - Hemorrhoids found on perianal exam.                           - Diverticulosis in the entire examined colon.                           - The examination was otherwise normal on direct  and retroflexion views.                           - No specimens collected. Moderate Sedation:      Moderate (conscious) sedation was personally administered by an       anesthesia professional. The following parameters were monitored: oxygen       saturation, heart rate, blood pressure, respiratory rate, EKG, adequacy       of pulmonary ventilation, and response to care. Recommendation:           - Patient has a contact number available for                            emergencies. The signs and symptoms of potential                            delayed complications were discussed with the                            patient. Return to normal activities tomorrow.                            Written discharge instructions were provided to  the                            patient.                           - Repeat colonoscopy in 5 years for surveillance.                           - Return to GI office (date not yet determined). Procedure Code(s):        --- Professional ---                           [phone], Colonoscopy, flexible; diagnostic, including                            collection of specimen(s) by brushing or washing,                            when performed (separate procedure) Diagnosis Code(s):        --- Professional ---                           Z86.010, Personal history of colonic polyps                           K64.9, Unspecified hemorrhoids                           K57.30, Diverticulosis of large intestine without                            perforation or abscess without bleeding CPT copyright 2019 American Medical Association. All rights reserved. The codes documented in this  report are preliminary and upon coder review may  be revised to meet current compliance requirements. Cristopher Estimable. Sayvon Arterberry, MD Norvel Richards, MD 10/13/2020 8:08:18 AM This report has been signed electronically. Number of Addenda: 0

## 2020-10-20 ENCOUNTER — Encounter (HOSPITAL_COMMUNITY): Payer: Self-pay | Admitting: Internal Medicine

## 2020-10-25 ENCOUNTER — Ambulatory Visit: Payer: 59 | Admitting: Orthopaedic Surgery

## 2020-11-03 ENCOUNTER — Other Ambulatory Visit: Payer: Self-pay

## 2020-11-03 ENCOUNTER — Ambulatory Visit (INDEPENDENT_AMBULATORY_CARE_PROVIDER_SITE_OTHER): Payer: 59 | Admitting: Orthopaedic Surgery

## 2020-11-03 ENCOUNTER — Encounter: Payer: Self-pay | Admitting: Orthopaedic Surgery

## 2020-11-03 VITALS — BP 152/101 | HR 87 | Ht 64.0 in | Wt 175.0 lb

## 2020-11-03 DIAGNOSIS — M25551 Pain in right hip: Secondary | ICD-10-CM

## 2020-11-03 DIAGNOSIS — M25511 Pain in right shoulder: Secondary | ICD-10-CM | POA: Diagnosis not present

## 2020-11-03 DIAGNOSIS — G8929 Other chronic pain: Secondary | ICD-10-CM | POA: Diagnosis not present

## 2020-11-03 DIAGNOSIS — M25552 Pain in left hip: Secondary | ICD-10-CM

## 2020-11-03 DIAGNOSIS — M25561 Pain in right knee: Secondary | ICD-10-CM

## 2020-11-03 DIAGNOSIS — M25562 Pain in left knee: Secondary | ICD-10-CM

## 2020-11-03 DIAGNOSIS — M5442 Lumbago with sciatica, left side: Secondary | ICD-10-CM

## 2020-11-03 MED ORDER — HYDROCODONE-ACETAMINOPHEN 7.5-325 MG PO TABS
1.0000 | ORAL_TABLET | Freq: Four times a day (QID) | ORAL | 0 refills | Status: DC | PRN
Start: 2020-11-03 — End: 2020-12-01

## 2020-11-03 MED ORDER — PREDNISONE 5 MG (21) PO TBPK: ORAL_TABLET | ORAL | 0 refills | Status: DC

## 2020-11-03 NOTE — Progress Notes (Signed)
PROCEDURE NOTE:  The patient request injection, verbal consent was obtained.  The right shoulder was prepped appropriately after time out was performed.   Sterile technique was observed and injection of 1 cc of Depo-Medrol 40 mg with several cc's of plain xylocaine. Anesthesia was provided by ethyl chloride and a 20-gauge needle was used to inject the shoulder area. A posterior approach was used.  The injection was tolerated well.  A band aid dressing was applied.  The patient was advised to apply ice later today and tomorrow to the injection sight as needed.  Return in one month.  I have reviewed the [place] Controlled Substance Reporting System web site prior to prescribing narcotic medicine for this patient.   Electronically Signed Darreld Mclean, MD 1/6/20229:31 AM

## 2020-11-29 ENCOUNTER — Telehealth: Payer: Self-pay

## 2020-11-29 MED ORDER — GABAPENTIN 300 MG PO CAPS: 300.0000 mg | ORAL_CAPSULE | Freq: Three times a day (TID) | ORAL | 1 refills | Status: DC

## 2020-11-29 NOTE — Telephone Encounter (Signed)
Will refill gabapentin

## 2020-11-29 NOTE — Telephone Encounter (Signed)
Pt needs gabapentin refilled pharmacy has sent refill requests.

## 2020-12-01 ENCOUNTER — Telehealth: Payer: Self-pay

## 2020-12-01 ENCOUNTER — Ambulatory Visit (INDEPENDENT_AMBULATORY_CARE_PROVIDER_SITE_OTHER): Payer: 59 | Admitting: Orthopaedic Surgery

## 2020-12-01 ENCOUNTER — Other Ambulatory Visit: Payer: Self-pay

## 2020-12-01 ENCOUNTER — Encounter: Payer: Self-pay | Admitting: Orthopaedic Surgery

## 2020-12-01 VITALS — BP 142/90 | HR 93 | Wt 171.0 lb

## 2020-12-01 DIAGNOSIS — G8929 Other chronic pain: Secondary | ICD-10-CM

## 2020-12-01 DIAGNOSIS — M25562 Pain in left knee: Secondary | ICD-10-CM

## 2020-12-01 DIAGNOSIS — M7062 Trochanteric bursitis, left hip: Secondary | ICD-10-CM | POA: Diagnosis not present

## 2020-12-01 DIAGNOSIS — M25511 Pain in right shoulder: Secondary | ICD-10-CM

## 2020-12-01 DIAGNOSIS — M25552 Pain in left hip: Secondary | ICD-10-CM

## 2020-12-01 DIAGNOSIS — M25551 Pain in right hip: Secondary | ICD-10-CM

## 2020-12-01 DIAGNOSIS — M25561 Pain in right knee: Secondary | ICD-10-CM

## 2020-12-01 MED ORDER — HYDROCODONE-ACETAMINOPHEN 7.5-325 MG PO TABS: 1.0000 | ORAL_TABLET | Freq: Four times a day (QID) | ORAL | 0 refills | Status: DC | PRN

## 2020-12-01 NOTE — Progress Notes (Signed)
PROCEDURE NOTE:  The patient request injection, verbal consent was obtained.  The right shoulder was prepped appropriately after time out was performed.   Sterile technique was observed and injection of 1 cc of Depo-Medrol 40 mg with several cc's of plain xylocaine. Anesthesia was provided by ethyl chloride and a 20-gauge needle was used to inject the shoulder area. A posterior approach was used.  The injection was tolerated well.  A band aid dressing was applied.  The patient was advised to apply ice later today and tomorrow to the injection sight as needed.   PROCEDURE NOTE:  The patient request injection, verbal consent was obtained.  The left trochanteric area of the hip was prepped appropriately after time out was performed.   Sterile technique was observed and injection of 1 cc of Depo-Medrol 40 mg with several cc's of plain xylocaine. Anesthesia was provided by ethyl chloride and a 20-gauge needle was used to inject the hip area. The injection was tolerated well.  A band aid dressing was applied.  The patient was advised to apply ice later today and tomorrow to the injection sight as needed.  I have reviewed the [place] web site prior to prescribing narcotic medicine for this patient.   Return in one month.  Electronically Signed Sanjuana Kava, MD 2/3/20229:33 AM

## 2020-12-01 NOTE — Telephone Encounter (Signed)
Pt aware to call [facility] and have them transfer prescription to [facility] in [place]. Pt voiced understanding. [place]

## 2020-12-01 NOTE — Telephone Encounter (Signed)
Pt needs the gabapentin sent to the [facility] in [place] because the [facility] Rx is about a $40 difference

## 2020-12-28 ENCOUNTER — Other Ambulatory Visit: Payer: Self-pay | Admitting: Adult Health

## 2020-12-29 ENCOUNTER — Other Ambulatory Visit: Payer: Self-pay

## 2020-12-29 ENCOUNTER — Encounter: Payer: Self-pay | Admitting: Orthopaedic Surgery

## 2020-12-29 ENCOUNTER — Ambulatory Visit (INDEPENDENT_AMBULATORY_CARE_PROVIDER_SITE_OTHER): Payer: 59 | Admitting: Orthopaedic Surgery

## 2020-12-29 VITALS — BP 143/82 | HR 91 | Ht 64.0 in | Wt 171.0 lb

## 2020-12-29 DIAGNOSIS — M7062 Trochanteric bursitis, left hip: Secondary | ICD-10-CM

## 2020-12-29 DIAGNOSIS — G8929 Other chronic pain: Secondary | ICD-10-CM | POA: Diagnosis not present

## 2020-12-29 DIAGNOSIS — M25511 Pain in right shoulder: Secondary | ICD-10-CM | POA: Diagnosis not present

## 2020-12-29 MED ORDER — HYDROCODONE-ACETAMINOPHEN 7.5-325 MG PO TABS: 1.0000 | ORAL_TABLET | Freq: Four times a day (QID) | ORAL | 0 refills | Status: DC | PRN

## 2020-12-29 NOTE — Progress Notes (Signed)
PROCEDURE NOTE:  The patient request injection, verbal consent was obtained.  The right shoulder was prepped appropriately after time out was performed.   Sterile technique was observed and injection of 1 cc of Celestone 6 mg with several cc's of plain xylocaine. Anesthesia was provided by ethyl chloride and a 20-gauge needle was used to inject the shoulder area. A posterior approach was used.  The injection was tolerated well.  A band aid dressing was applied.  The patient was advised to apply ice later today and tomorrow to the injection sight as needed.  PROCEDURE NOTE:  The patient request injection, verbal consent was obtained.  The left trochanteric area of the hip was prepped appropriately after time out was performed.   Sterile technique was observed and injection of 1 cc of Celestone 6 mg with several cc's of plain xylocaine. Anesthesia was provided by ethyl chloride and a 20-gauge needle was used to inject the hip area. The injection was tolerated well.  A band aid dressing was applied.  The patient was advised to apply ice later today and tomorrow to the injection sight as needed.  I have reviewed the [place] web site prior to prescribing narcotic medicine for this patient.   Call if any problem.  Precautions discussed.   Return in one month.  Electronically [place], MD 3/3/20229:45 AM

## 2021-01-26 ENCOUNTER — Ambulatory Visit (INDEPENDENT_AMBULATORY_CARE_PROVIDER_SITE_OTHER): Payer: 59 | Admitting: Orthopaedic Surgery

## 2021-01-26 ENCOUNTER — Ambulatory Visit: Payer: 59

## 2021-01-26 ENCOUNTER — Other Ambulatory Visit: Payer: Self-pay

## 2021-01-26 ENCOUNTER — Encounter: Payer: Self-pay | Admitting: Orthopaedic Surgery

## 2021-01-26 VITALS — Ht 64.0 in | Wt 173.0 lb

## 2021-01-26 DIAGNOSIS — M25511 Pain in right shoulder: Secondary | ICD-10-CM | POA: Diagnosis not present

## 2021-01-26 DIAGNOSIS — G8929 Other chronic pain: Secondary | ICD-10-CM

## 2021-01-26 DIAGNOSIS — M25561 Pain in right knee: Secondary | ICD-10-CM

## 2021-01-26 DIAGNOSIS — M7062 Trochanteric bursitis, left hip: Secondary | ICD-10-CM | POA: Diagnosis not present

## 2021-01-26 MED ORDER — HYDROCODONE-ACETAMINOPHEN 7.5-325 MG PO TABS: 1.0000 | ORAL_TABLET | Freq: Four times a day (QID) | ORAL | 0 refills | Status: DC | PRN

## 2021-01-26 NOTE — Progress Notes (Addendum)
PROCEDURE NOTE:  The patient request injection, verbal consent was obtained.  The right shoulder was prepped appropriately after time out was performed.   Sterile technique was observed and injection of 1 cc of Celestone 6 mg with several cc's of plain xylocaine. Anesthesia was provided by ethyl chloride and a 20-gauge needle was used to inject the shoulder area. A posterior approach was used.  The injection was tolerated well.  A band aid dressing was applied.  The patient was advised to apply ice later today and tomorrow to the injection sight as needed.  PROCEDURE NOTE:  The patient request injection, verbal consent was obtained.  The left trochanteric area of the hip was prepped appropriately after time out was performed.   Sterile technique was observed and injection of 1 cc of Celestone 6 mg with several cc's of plain xylocaine. Anesthesia was provided by ethyl chloride and a 20-gauge needle was used to inject the hip area. The injection was tolerated well.  A band aid dressing was applied.  The patient was advised to apply ice later today and tomorrow to the injection sight as needed.  She has pain of the right knee which began about three weeks ago and is getting worse. She has swelling and popping but no giving way.  Nothing seems to help, she has used ice, rest, Advil.  She has no trauma.  X-rays were done of the right knee, reported separately.  Encounter Diagnoses  Name Primary?  . Acute pain of right knee Yes  . Trochanteric bursitis, left hip   . Chronic pain in right shoulder    I will see her in two weeks for the knee.  I refilled her pain medicine.  Call if any problem.  Precautions discussed.  Sheet of exercises for the right knee were given.  I have reviewed the [place] web site prior to prescribing narcotic medicine for this patient.   Electronically Signed Sanjuana Kava, MD 3/31/20229:17 AM

## 2021-02-09 ENCOUNTER — Other Ambulatory Visit: Payer: Self-pay

## 2021-02-09 ENCOUNTER — Encounter: Payer: Self-pay | Admitting: Orthopaedic Surgery

## 2021-02-09 ENCOUNTER — Ambulatory Visit (INDEPENDENT_AMBULATORY_CARE_PROVIDER_SITE_OTHER): Payer: 59 | Admitting: Orthopaedic Surgery

## 2021-02-09 VITALS — BP 113/83 | HR 82 | Ht 64.0 in | Wt 171.5 lb

## 2021-02-09 DIAGNOSIS — F17210 Nicotine dependence, cigarettes, uncomplicated: Secondary | ICD-10-CM

## 2021-02-09 DIAGNOSIS — G8929 Other chronic pain: Secondary | ICD-10-CM | POA: Diagnosis not present

## 2021-02-09 DIAGNOSIS — M25561 Pain in right knee: Secondary | ICD-10-CM | POA: Diagnosis not present

## 2021-02-09 DIAGNOSIS — M25511 Pain in right shoulder: Secondary | ICD-10-CM

## 2021-02-09 NOTE — Progress Notes (Signed)
Patient ER:Judith Rodriguez, female DOB:1966-01-06, 55 y.o. Judith Rodriguez:[id number]  Chief Complaint  Patient presents with  . Knee Pain    R/doing better  . Hand Pain    R/ring finger hurts, feels like a cyst on the joint.    HPI  Judith Rodriguez is a 55 y.o. female who has right shoulder and right knee pain. They are better.  She has a small cyst on the right ring finger near the DIP joint.  She has no new trauma, no redness. She is taking her medicine.   Body mass index is 29.44 kg/m.  ROS  Review of Systems  Constitutional:       Patient does not have Diabetes Mellitus. Patient does not have hypertension. Patient does not have COPD or shortness of breath. Patient does not have BMI > 35. Patient has current smoking history.  HENT: Negative for congestion.   Respiratory: Negative for cough and shortness of breath.   Cardiovascular: Negative for chest pain and leg swelling.  Endocrine: Negative for cold intolerance.  Musculoskeletal: Positive for arthralgias and back pain.  Allergic/Immunologic: Positive for environmental allergies.  All other systems reviewed and are negative.   All other systems reviewed and are negative.  The following is a summary of the past history medically, past history surgically, known current medicines, social history and family history.  This information is gathered electronically by the computer from prior information and documentation.  I review this each visit and have found including this information at this point in the chart is beneficial and informative.    Past Medical History:  Diagnosis Date  . Arthritis   . Bulging lumbar disc    pushing on sciatic nerve L4  . Carpal tunnel syndrome   . Depression 12/01/2015  . Dyslipidemia 12/05/2015   Will try diet and exercise first  . Hot flashes 12/01/2015  . Vitamin D deficiency 12/05/2015   Take vitamin D3 5000 iu daily    Past Surgical History:  Procedure Laterality Date  . ABDOMINAL HYSTERECTOMY    .  CESAREAN SECTION    . COLONOSCOPY WITH PROPOFOL N/A 03/06/2018   Rourk: 4 colon polyps removed.  One was 30 mm at the hepatic flexure removed piecemeal.  Diverticulosis.  Nonbleeding internal hemorrhoids.  Pathology revealed tubular adenomas.  Advised for repeat colonoscopy in 1 year  . COLONOSCOPY WITH PROPOFOL N/A 10/13/2020   Procedure: COLONOSCOPY WITH PROPOFOL;  Surgeon: Daneil Dolin, MD;  [facility];  Service: Endoscopy;  Laterality: N/A;  7:30am  . HUMERUS FRACTURE SURGERY Right 12/2019  . POLYPECTOMY  03/06/2018   Procedure: POLYPECTOMY;  Surgeon: Daneil Dolin, MD;  [facility]: [facility];  Service: Endoscopy;;  cecal polyp cs, hepatic flexure polyps  . SALPINGOOPHORECTOMY Left     Family History  Problem Relation Age of Onset  . Cancer Mother        lung  . Cancer Father        lung  . Hypertension Father   . Hypertension Brother   . Aneurysm Paternal Grandmother        brain  . Heart disease Paternal Grandfather        heart attack  . Hypertension Sister   . Colon cancer Paternal Aunt        less than age 17    Social History Social History   Tobacco Use  . Smoking status: Current Every Day Smoker    Packs/day: 0.25    Years: 33.00  Pack years: 8.25    Types: Cigarettes  . Smokeless tobacco: Never Used  Vaping Use  . Vaping Use: Never used  Substance Use Topics  . Alcohol use: No  . Drug use: No    Allergies  Allergen Reactions  . Other Other (See Comments)    Patient reports band aids break her out,   . Aspirin Hives    Current Outpatient Medications  Medication Sig Dispense Refill  . Cholecalciferol (VITAMIN D-3) 5000 units TABS Take 1 tablet by mouth daily.     . citalopram (CELEXA) 20 MG tablet TAKE 1 TABLET(20 MG) BY MOUTH DAILY 30 tablet 6  . diphenhydrAMINE (BENADRYL) 25 mg capsule Take 25 mg by mouth daily as needed for allergies.     Marland Kitchen gabapentin (NEURONTIN) 300 MG capsule Take 1 capsule (300 mg total) by mouth 3 (three)  times daily. 90 capsule 1  . HYDROcodone-acetaminophen (NORCO) 7.5-325 MG tablet Take 1 tablet by mouth every 6 (six) hours as needed for moderate pain. 120 tablet 0  . Menthol, Topical Analgesic, (BENGAY EX) Apply 1 application topically daily as needed (muscle pain).    . predniSONE (STERAPRED UNI-PAK 21 TAB) 5 MG (21) TBPK tablet Take 6 pills first day; 5 pills second day; 4 pills third day; 3 pills fourth day; 2 pills next day and 1 pill last day. 21 tablet 0  . tiZANidine (ZANAFLEX) 4 MG tablet One by mouth every night before bed as needed for spasm 30 tablet 1   No current facility-administered medications for this visit.     Physical Exam  Blood pressure 113/83, pulse 82, height 5\' 4"  (1.626 m), weight 171 lb 8 oz (77.8 kg).  Constitutional: overall normal hygiene, normal nutrition, well developed, normal grooming, normal body habitus. Assistive device:none  Musculoskeletal: gait and station Limp none, muscle tone and strength are normal, no tremors or atrophy is present.  .  Neurological: coordination overall normal.  Deep tendon reflex/nerve stretch intact.  Sensation normal.  Cranial nerves II-XII intact.   Skin:   Normal overall no scars, lesions, ulcers or rashes. No psoriasis.  Psychiatric: Alert and oriented x 3.  Recent memory intact, remote memory unclear.  Normal mood and affect. Well groomed.  Good eye contact.  Cardiovascular: overall no swelling, no varicosities, no edema bilaterally, normal temperatures of the legs and arms, no clubbing, cyanosis and good capillary refill.  Lymphatic: palpation is normal.  Right shoulder with full ROM but pain in the extremes.  Right knee with crepitus, no effusion, ROM 0 to 110.  The right ring finger has a small cystic area just proximal and radial to the DIP joint.  It is very small but tender.  There is no redness, NV intact, Full ROM.  All other systems reviewed and are negative   The patient has been educated about the  nature of the problem(s) and counseled on treatment options.  The patient appeared to understand what I have discussed and is in agreement with it.  Encounter Diagnoses  Name Primary?  . Chronic pain in right shoulder Yes  . Cigarette nicotine dependence without complication   . Chronic pain of right knee     PLAN Call if any problems.  Precautions discussed.  Continue current medications.   Return to clinic 6 weeks   Watch the finger cyst for now.  Electronically Signed Sanjuana Kava, MD 4/14/20228:44 AM

## 2021-02-09 NOTE — Patient Instructions (Signed)

## 2021-02-22 ENCOUNTER — Telehealth: Payer: Self-pay | Admitting: Adult Health

## 2021-02-22 NOTE — Telephone Encounter (Signed)
Patient called stating that she needs a refill of her Celexa sent to her [facility] in [place] and her Gabapentin [facility] [place] because she gets it cheaper there. Pt states that she contacted her pharmacies two weeks ago and she hasn't heard anything back. Please contact pt when sent

## 2021-02-23 ENCOUNTER — Other Ambulatory Visit: Payer: Self-pay | Admitting: Adult Health

## 2021-02-23 ENCOUNTER — Telehealth: Payer: Self-pay | Admitting: Orthopaedic Surgery

## 2021-02-23 MED ORDER — HYDROCODONE-ACETAMINOPHEN 7.5-325 MG PO TABS
1.0000 | ORAL_TABLET | Freq: Four times a day (QID) | ORAL | 0 refills | Status: DC | PRN
Start: 2021-02-23 — End: 2021-03-28

## 2021-02-23 MED ORDER — CITALOPRAM HYDROBROMIDE 20 MG PO TABS
ORAL_TABLET | ORAL | 6 refills | Status: AC
Start: 2021-02-23 — End: ?

## 2021-02-23 MED ORDER — GABAPENTIN 300 MG PO CAPS: 1.0000 | ORAL_CAPSULE | Freq: Three times a day (TID) | ORAL | 1 refills | Status: DC

## 2021-02-23 NOTE — Telephone Encounter (Signed)
[facility] at [facility] and [facility] at [facility]

## 2021-02-23 NOTE — Telephone Encounter (Signed)
Patient aware that she has refills on Celexa at [facility]. She needs gabapentin sent to [facility] due to cost. Aware to check with pharmacy later today.

## 2021-02-23 NOTE — Telephone Encounter (Signed)
Patient called for refill *Asked for Judith Rodriguez to call her also   HYDROcodone-acetaminophen (NORCO) 7.5-325 MG tablet 120 tablet  [employer] [facility], [address], [place]

## 2021-02-23 NOTE — Addendum Note (Signed)
Addended by: Derrek Monaco A on: 02/23/2021 05:07 PM   Modules accepted: Orders

## 2021-03-09 ENCOUNTER — Other Ambulatory Visit: Payer: Self-pay

## 2021-03-09 ENCOUNTER — Ambulatory Visit (INDEPENDENT_AMBULATORY_CARE_PROVIDER_SITE_OTHER): Payer: 59 | Admitting: Orthopaedic Surgery

## 2021-03-09 ENCOUNTER — Encounter: Payer: Self-pay | Admitting: Orthopaedic Surgery

## 2021-03-09 VITALS — Ht 64.0 in | Wt 171.0 lb

## 2021-03-09 DIAGNOSIS — M7062 Trochanteric bursitis, left hip: Secondary | ICD-10-CM

## 2021-03-09 DIAGNOSIS — G8929 Other chronic pain: Secondary | ICD-10-CM

## 2021-03-09 DIAGNOSIS — F17210 Nicotine dependence, cigarettes, uncomplicated: Secondary | ICD-10-CM

## 2021-03-09 DIAGNOSIS — M25511 Pain in right shoulder: Secondary | ICD-10-CM

## 2021-03-09 NOTE — Progress Notes (Signed)
PROCEDURE NOTE:  The patient request injection, verbal consent was obtained.  The left trochanteric area of the hip was prepped appropriately after time out was performed.   Sterile technique was observed and injection of 1 cc of Celestone 6 mg with several cc's of plain xylocaine. Anesthesia was provided by ethyl chloride and a 20-gauge needle was used to inject the hip area. The injection was tolerated well.  A band aid dressing was applied.  The patient was advised to apply ice later today and tomorrow to the injection sight as needed.  I will see as needed.  She has problems with her shoulder and was treated for this at [place].  She wants to be seen in [place] for this.  I will arrange that.  Electronically Signed Sanjuana Kava, MD 5/12/20228:45 AM

## 2021-03-09 NOTE — Addendum Note (Signed)
Addended by: Brand Males E on: 03/09/2021 09:29 AM   Modules accepted: Orders

## 2021-03-09 NOTE — Telephone Encounter (Signed)
error 

## 2021-03-23 ENCOUNTER — Other Ambulatory Visit: Payer: Self-pay

## 2021-03-23 ENCOUNTER — Ambulatory Visit (INDEPENDENT_AMBULATORY_CARE_PROVIDER_SITE_OTHER): Payer: 59 | Admitting: Orthopedic Surgery

## 2021-03-23 ENCOUNTER — Ambulatory Visit: Payer: 59 | Admitting: Orthopaedic Surgery

## 2021-03-23 ENCOUNTER — Encounter: Payer: Self-pay | Admitting: Orthopedic Surgery

## 2021-03-23 ENCOUNTER — Ambulatory Visit (INDEPENDENT_AMBULATORY_CARE_PROVIDER_SITE_OTHER): Payer: 59

## 2021-03-23 VITALS — Ht 64.0 in | Wt 171.0 lb

## 2021-03-23 DIAGNOSIS — M25511 Pain in right shoulder: Secondary | ICD-10-CM | POA: Diagnosis not present

## 2021-03-23 DIAGNOSIS — Z8781 Personal history of (healed) traumatic fracture: Secondary | ICD-10-CM

## 2021-03-23 DIAGNOSIS — G8929 Other chronic pain: Secondary | ICD-10-CM

## 2021-03-23 DIAGNOSIS — Z9889 Other specified postprocedural states: Secondary | ICD-10-CM

## 2021-03-23 NOTE — Progress Notes (Signed)
Office Visit Note   Patient: Judith Rodriguez           Date of Birth: [birth year]           MRN: [id number] Visit Date: 03/23/2021 Requested by: Sanjuana Kava, [address],  [place] [id number] PCP: Rory Percy, MD  Subjective: Chief Complaint  Patient presents with  . Right Shoulder - Pain    Fall 12/28/2019    HPI: Wm Fruchter is a 55 y.o. female who presents to the office complaining of right shoulder pain.  Patient has history of bilateral shoulder injury on 12/28/2019 when she lost balance in the shower and fell.  She had bilateral shoulder fractures but only one required intervention.  She had ORIF by Dr. Arbie Cookey at [facility] of the right shoulder.  This was 14 months ago and she complains of constant pain since surgery.  She does note some improvement of pain but she has continued pain throughout the entirety of the right shoulder that radiates into her bicep.  She also has neck and shoulder blade pain on the same side.  Denies any numbness or tingling in the arm, changes in her gait, history of neck or other shoulder surgery.  Pain wakes her up most nights.  She reports her skin "hurts to the touch".  She did have shoulder pain prior to her shoulder injury.  She works as a Scientist, clinical (histocompatibility and immunogenetics).  She has been able to return to work where she works for 3 to 4 hours but after keeping her arm overhead for such a long period of time it causes her severe pain and she has to go home after this length of time.  She sees Dr. Luna Glasgow who prescribes hydrocodone 7.5 mg helps with some of her pain.  He also administers injections in the right shoulder about once per month over the last 5 months that provides 2 weeks of 60% relief.  She has been on opioid medication for 5 years.  She takes it every 4 to 6 hours.  She has no history of any prior MRI of the cervical spine but radiographs from 11 months ago did show mild to moderate osteoarthritis..                ROS: All systems reviewed are  negative as they relate to the chief complaint within the history of present illness.  Patient denies fevers or chills.  Assessment & Plan: Visit Diagnoses:  1. Chronic right shoulder pain   2. S/P ORIF (open reduction internal fixation) fracture     Plan: Patient is a 55 year old female who presents complaint of right shoulder pain.  She sustained a proximal humerus fracture on 12/28/2019 that required ORIF by Dr. Arbie Cookey at [facility].  She has history of right shoulder pain even prior to the injury.  She works as a Armed forces training and education officer.  Radiographs show hardware in good position with no complicating features.  She does have pain with rotator cuff testing as well as passive motion of the shoulder and tenderness over the bicipital groove with positive O'Brien sign.  No weakness of the rotator cuff on exam.  She has seen Dr. Kayleen Memos who was considering hardware removal and biceps tenodesis.  Discussed patient's radiographs and her shoulder pain with her today.  Does seem that she has pretty good relief from the shoulder injection she has been receiving from Dr. Luna Glasgow.  However, there is a significant component that seems to be radicular pain from  the cervical spine by her history.  Gave her about 50/50 odds that any operative intervention would be significantly beneficial.  Patient understands.  Plan to also reach out to Dr. Luna Glasgow for his impression given his greater history seen Otila Kluver.  Follow-Up Instructions: No follow-ups on file.   Orders:  Orders Placed This Encounter  Procedures  . XR Shoulder Right   No orders of the defined types were placed in this encounter.     Procedures: No procedures performed   Clinical Data: No additional findings.  Objective: Vital Signs: Ht 5\' 4"  (1.626 m)   Wt 171 lb (77.6 kg)   BMI 29.35 kg/m   Physical Exam:  Constitutional: Patient appears well-developed HEENT:  Head: Normocephalic Eyes:EOM are normal Neck: Normal range of  motion Cardiovascular: Normal rate Pulmonary/chest: Effort normal Neurologic: Patient is alert Skin: Skin is warm Psychiatric: Patient has normal mood and affect  Ortho Exam: Ortho exam demonstrates right shoulder with 40 degrees external rotation, 90 degrees abduction, 150 degrees forward flexion.  Left shoulder with 45 degrees external rotation, 120 degrees abduction, 175 degrees forward flexion.  Mild crepitus noted with passive motion of the shoulder.  Excellent rotator cuff strength of supraspinatus, infraspinatus, subscapularis of the right shoulder rated 5/5.  Tenderness over the bicipital groove.  Positive O'Brien.  Mild tenderness over the [facility] joint.  Moderate tenderness throughout the axial cervical spine.  Negative Spurling sign.  Pain with cervical spine range of motion actively.  Able to actively lift his arm overhead.  Negative Hornblower sign.  Negative drop arm test.  5/5 motor strength of bilateral grip strength, finger abduction, pronation/supination, bicep, tricep, deltoid.  Axillary nerve intact with deltoid firing.Marland Kitchen  Specialty Comments:  No specialty comments available.  Imaging: No results found.   PMFS History: Patient Active Problem List   Diagnosis Date Noted  . H/O adenomatous polyp of colon 09/09/2020  . Impaired mobility and ADLs 01/05/2020  . Closed fracture of part of humerus 12/28/2019  . Fall 12/28/2019  . History of back pain 10/19/2019  . Rectocele 10/19/2019  . Left inguinal pain 10/19/2019  . Pelvic relaxation 10/19/2019  . Encounter for screening colonoscopy 02/25/2018  . Screening for colorectal cancer 06/17/2017  . Hemorrhoids 06/17/2017  . Vaginal dryness, menopausal 12/17/2016  . Vitamin D deficiency 12/05/2015  . Dyslipidemia 12/05/2015  . Depression 12/01/2015  . Hot flashes 12/01/2015  . Tobacco user 06/28/2014  . Chronic RLQ pain 01/06/2014   Past Medical History:  Diagnosis Date  . Arthritis   . Bulging lumbar disc    pushing on  sciatic nerve L4  . Carpal tunnel syndrome   . Depression 12/01/2015  . Dyslipidemia 12/05/2015   Will try diet and exercise first  . Hot flashes 12/01/2015  . Vitamin D deficiency 12/05/2015   Take vitamin D3 5000 iu daily    Family History  Problem Relation Age of Onset  . Cancer Mother        lung  . Cancer Father        lung  . Hypertension Father   . Hypertension Brother   . Aneurysm Paternal Grandmother        brain  . Heart disease Paternal Grandfather        heart attack  . Hypertension Sister   . Colon cancer Paternal Aunt        less than age 28    Past Surgical History:  Procedure Laterality Date  . ABDOMINAL HYSTERECTOMY    .  CESAREAN SECTION    . COLONOSCOPY WITH PROPOFOL N/A 03/06/2018   Rourk: 4 colon polyps removed.  One was 30 mm at the hepatic flexure removed piecemeal.  Diverticulosis.  Nonbleeding internal hemorrhoids.  Pathology revealed tubular adenomas.  Advised for repeat colonoscopy in 1 year  . COLONOSCOPY WITH PROPOFOL N/A 10/13/2020   Procedure: COLONOSCOPY WITH PROPOFOL;  Surgeon: Daneil Dolin, MD;  [facility];  Service: Endoscopy;  Laterality: N/A;  7:30am  . HUMERUS FRACTURE SURGERY Right 12/2019  . POLYPECTOMY  03/06/2018   Procedure: POLYPECTOMY;  Surgeon: Daneil Dolin, MD;  [facility]: [facility];  Service: Endoscopy;;  cecal polyp cs, hepatic flexure polyps  . SALPINGOOPHORECTOMY Left    Social History   Occupational History  . Not on file  Tobacco Use  . Smoking status: Current Every Day Smoker    Packs/day: 0.25    Years: 33.00    Pack years: 8.25    Types: Cigarettes  . Smokeless tobacco: Never Used  Vaping Use  . Vaping Use: Never used  Substance and Sexual Activity  . Alcohol use: No  . Drug use: No  . Sexual activity: Not Currently    Birth control/protection: Surgical    Comment: hyst

## 2021-03-25 ENCOUNTER — Encounter: Payer: Self-pay | Admitting: Orthopedic Surgery

## 2021-03-28 ENCOUNTER — Telehealth: Payer: Self-pay | Admitting: Orthopaedic Surgery

## 2021-03-28 MED ORDER — HYDROCODONE-ACETAMINOPHEN 7.5-325 MG PO TABS: 1.0000 | ORAL_TABLET | Freq: Four times a day (QID) | ORAL | 0 refills | Status: DC | PRN

## 2021-03-28 NOTE — Telephone Encounter (Signed)
Hydrocodone-Acetaminophen  7.5/325 mg   PATIENT USES [facility] [address]

## 2021-03-28 NOTE — Telephone Encounter (Signed)
Patient called requesting to speak directly with [facility]. Please call her at [phone].

## 2021-04-04 ENCOUNTER — Encounter: Payer: Self-pay | Admitting: Orthopaedic Surgery

## 2021-04-04 ENCOUNTER — Ambulatory Visit (INDEPENDENT_AMBULATORY_CARE_PROVIDER_SITE_OTHER): Payer: 59 | Admitting: Orthopaedic Surgery

## 2021-04-04 ENCOUNTER — Other Ambulatory Visit: Payer: Self-pay

## 2021-04-04 VITALS — Ht 64.0 in | Wt 171.0 lb

## 2021-04-04 DIAGNOSIS — M7062 Trochanteric bursitis, left hip: Secondary | ICD-10-CM

## 2021-04-04 DIAGNOSIS — G8929 Other chronic pain: Secondary | ICD-10-CM | POA: Diagnosis not present

## 2021-04-04 DIAGNOSIS — M25511 Pain in right shoulder: Secondary | ICD-10-CM | POA: Diagnosis not present

## 2021-04-04 DIAGNOSIS — F17210 Nicotine dependence, cigarettes, uncomplicated: Secondary | ICD-10-CM

## 2021-04-04 NOTE — Progress Notes (Signed)
PROCEDURE NOTE:  The patient request injection, verbal consent was obtained.  The left trochanteric area of the hip was prepped appropriately after time out was performed.   Sterile technique was observed and injection of 1 cc of DepoMedrol 40 mg with several cc's of plain xylocaine. Anesthesia was provided by ethyl chloride and a 20-gauge needle was used to inject the hip area. The injection was tolerated well.  A band aid dressing was applied.  The patient was advised to apply ice later today and tomorrow to the injection sight as needed.   PROCEDURE NOTE:  The patient request injection, verbal consent was obtained.  The right shoulder was prepped appropriately after time out was performed.   Sterile technique was observed and injection of 1 cc of DepoMedrol 40mg  with several cc's of plain xylocaine. Anesthesia was provided by ethyl chloride and a 20-gauge needle was used to inject the shoulder area. A posterior approach was used.  The injection was tolerated well.  A band aid dressing was applied.  The patient was advised to apply ice later today and tomorrow to the injection sight as needed.  Return in one month.  She saw Dr. Marlou Sa.  I appreciate him seeing her.  She has decided not to consider surgery at this time.  Call if any problem.  Precautions discussed.   Electronically Signed Sanjuana Kava, MD 6/7/20222:45 PM

## 2021-04-27 ENCOUNTER — Telehealth: Payer: Self-pay | Admitting: Orthopaedic Surgery

## 2021-04-27 NOTE — Telephone Encounter (Signed)
            Hydrocodone-Acetaminophen  7.5/325 mg   PATIENT USES [facility] [address]

## 2021-04-28 ENCOUNTER — Telehealth: Payer: Self-pay

## 2021-04-29 ENCOUNTER — Other Ambulatory Visit: Payer: Self-pay | Admitting: Adult Health

## 2021-05-01 MED ORDER — HYDROCODONE-ACETAMINOPHEN 7.5-325 MG PO TABS: 1.0000 | ORAL_TABLET | Freq: Four times a day (QID) | ORAL | 0 refills | Status: DC | PRN

## 2021-05-02 ENCOUNTER — Other Ambulatory Visit: Payer: Self-pay

## 2021-05-02 ENCOUNTER — Ambulatory Visit (INDEPENDENT_AMBULATORY_CARE_PROVIDER_SITE_OTHER): Payer: 59 | Admitting: Orthopaedic Surgery

## 2021-05-02 ENCOUNTER — Encounter: Payer: Self-pay | Admitting: Orthopaedic Surgery

## 2021-05-02 DIAGNOSIS — M25561 Pain in right knee: Secondary | ICD-10-CM | POA: Diagnosis not present

## 2021-05-02 DIAGNOSIS — M7062 Trochanteric bursitis, left hip: Secondary | ICD-10-CM | POA: Diagnosis not present

## 2021-05-02 DIAGNOSIS — G8929 Other chronic pain: Secondary | ICD-10-CM

## 2021-05-02 NOTE — Progress Notes (Signed)
PROCEDURE NOTE:  The patient request injection, verbal consent was obtained.  The right shoulder was prepped appropriately after time out was performed.   Sterile technique was observed and injection of 1 cc of Celestone 6mg  with several cc's of plain xylocaine. Anesthesia was provided by ethyl chloride and a 20-gauge needle was used to inject the shoulder area. A posterior approach was used.  The injection was tolerated well.  A band aid dressing was applied.  The patient was advised to apply ice later today and tomorrow to the injection sight as needed.   PROCEDURE NOTE:  The patient request injection, verbal consent was obtained.  The left trochanteric area of the hip was prepped appropriately after time out was performed.   Sterile technique was observed and injection of 1 cc of Celestone 6 mg with several cc's of plain xylocaine. Anesthesia was provided by ethyl chloride and a 20-gauge needle was used to inject the hip area. The injection was tolerated well.  A band aid dressing was applied.  The patient was advised to apply ice later today and tomorrow to the injection sight as needed.  Return in one month.  Call if any problem.  Precautions discussed.  Electronically Signed Sanjuana Kava, MD 7/5/20228:40 AM

## 2021-05-02 NOTE — Patient Instructions (Signed)
Steps to Quit Smoking Smoking tobacco is the leading cause of preventable death. It can affect almost every organ in the body. Smoking puts you and people around you at risk for many serious, long-lasting (chronic) diseases. Quitting smoking can be hard, but it is one of the best things that you can do for your health. It is never too late to quit. How do I get ready to quit? When you decide to quit smoking, make a plan to help you succeed. Before you quit: Pick a date to quit. Set a date within the next 2 weeks to give you time to prepare. Write down the reasons why you are quitting. Keep this list in places where you will see it often. Tell your family, friends, and co-workers that you are quitting. Their support is important. Talk with your doctor about the choices that may help you quit. Find out if your health insurance will pay for these treatments. Know the people, places, things, and activities that make you want to smoke (triggers). Avoid them. What first steps can I take to quit smoking? Throw away all cigarettes at home, at work, and in your car. Throw away the things that you use when you smoke, such as ashtrays and lighters. Clean your car. Make sure to empty the ashtray. Clean your home, including curtains and carpets. What can I do to help me quit smoking? Talk with your doctor about taking medicines and seeing a counselor at the same time. You are more likely to succeed when you do both. If you are pregnant or breastfeeding, talk with your doctor about counseling or other ways to quit smoking. Do not take medicine to help you quit smoking unless your doctor tells you to do so. To quit smoking: Quit right away Quit smoking totally, instead of slowly cutting back on how much you smoke over a period of time. Go to counseling. You are more likely to quit if you go to counseling sessions regularly. Take medicine You may take medicines to help you quit. Some medicines need a  prescription, and some you can buy over-the-counter. Some medicines may contain a drug called nicotine to replace the nicotine in cigarettes. Medicines may: Help you to stop having the desire to smoke (cravings). Help to stop the problems that come when you stop smoking (withdrawal symptoms). Your doctor may ask you to use: Nicotine patches, gum, or lozenges. Nicotine inhalers or sprays. Non-nicotine medicine that is taken by mouth. Find resources Find resources and other ways to help you quit smoking and remain smoke-free after you quit. These resources are most helpful when you use them often. They include: Online chats with a counselor. Phone quitlines. Printed self-help materials. Support groups or group counseling. Text messaging programs. Mobile phone apps. Use apps on your mobile phone or tablet that can help you stick to your quit plan. There are many free apps for mobile phones and tablets as well as websites. Examples include Quit Guide from the CDC and smokefree.gov  What things can I do to make it easier to quit?  Talk to your family and friends. Ask them to support and encourage you. Call a phone quitline (1-800-QUIT-NOW), reach out to support groups, or work with a counselor. Ask people who smoke to not smoke around you. Avoid places that make you want to smoke, such as: Bars. Parties. Smoke-break areas at work. Spend time with people who do not smoke. Lower the stress in your life. Stress can make you want to   smoke. Try these things to help your stress: Getting regular exercise. Doing deep-breathing exercises. Doing yoga. Meditating. Doing a body scan. To do this, close your eyes, focus on one area of your body at a time from head to toe. Notice which parts of your body are tense. Try to relax the muscles in those areas. How will I feel when I quit smoking? Day 1 to 3 weeks Within the first 24 hours, you may start to have some problems that come from quitting tobacco.  These problems are very bad 2-3 days after you quit, but they do not often last for more than 2-3 weeks. You may get these symptoms: Mood swings. Feeling restless, nervous, angry, or annoyed. Trouble concentrating. Dizziness. Strong desire for high-sugar foods and nicotine. Weight gain. Trouble pooping (constipation). Feeling like you may vomit (nausea). Coughing or a sore throat. Changes in how the medicines that you take for other issues work in your body. Depression. Trouble sleeping (insomnia). Week 3 and afterward After the first 2-3 weeks of quitting, you may start to notice more positive results, such as: Better sense of smell and taste. Less coughing and sore throat. Slower heart rate. Lower blood pressure. Clearer skin. Better breathing. Fewer sick days. Quitting smoking can be hard. Do not give up if you fail the first time. Some people need to try a few times before they succeed. Do your best to stick to your quit plan, and talk with your doctor if you have any questions or concerns. Summary Smoking tobacco is the leading cause of preventable death. Quitting smoking can be hard, but it is one of the best things that you can do for your health. When you decide to quit smoking, make a plan to help you succeed. Quit smoking right away, not slowly over a period of time. When you start quitting, seek help from your doctor, family, or friends. This information is not intended to replace advice given to you by your health care provider. Make sure you discuss any questions you have with your health care provider. Document Revised: 07/10/2019 Document Reviewed: 01/03/2019 Elsevier Patient Education  2022 Elsevier Inc.  

## 2021-05-02 NOTE — Telephone Encounter (Signed)
Med refill

## 2021-05-30 ENCOUNTER — Telehealth: Payer: Self-pay | Admitting: Orthopaedic Surgery

## 2021-05-30 MED ORDER — HYDROCODONE-ACETAMINOPHEN 7.5-325 MG PO TABS: 1.0000 | ORAL_TABLET | Freq: Four times a day (QID) | ORAL | 0 refills | Status: DC | PRN

## 2021-05-30 NOTE — Telephone Encounter (Signed)
Patient called for refill: HYDROcodone-acetaminophen (NORCO) 7.5-325 MG tablet 140 tablet   [facility], [address] [place]

## 2021-06-23 ENCOUNTER — Other Ambulatory Visit: Payer: Self-pay | Admitting: Adult Health

## 2021-07-06 ENCOUNTER — Encounter: Payer: Self-pay | Admitting: Orthopaedic Surgery

## 2021-07-06 ENCOUNTER — Ambulatory Visit (INDEPENDENT_AMBULATORY_CARE_PROVIDER_SITE_OTHER): Payer: 59 | Admitting: Orthopaedic Surgery

## 2021-07-06 ENCOUNTER — Other Ambulatory Visit: Payer: Self-pay

## 2021-07-06 DIAGNOSIS — M25561 Pain in right knee: Secondary | ICD-10-CM

## 2021-07-06 DIAGNOSIS — G8929 Other chronic pain: Secondary | ICD-10-CM

## 2021-07-06 DIAGNOSIS — F17210 Nicotine dependence, cigarettes, uncomplicated: Secondary | ICD-10-CM

## 2021-07-06 DIAGNOSIS — M7062 Trochanteric bursitis, left hip: Secondary | ICD-10-CM

## 2021-07-06 MED ORDER — HYDROCODONE-ACETAMINOPHEN 7.5-325 MG PO TABS: 1.0000 | ORAL_TABLET | Freq: Four times a day (QID) | ORAL | 0 refills | Status: DC | PRN

## 2021-07-06 NOTE — Progress Notes (Signed)
PROCEDURE NOTE:  The patient request injection, verbal consent was obtained.  The right shoulder was prepped appropriately after time out was performed.   Sterile technique was observed and injection of 1 cc of Celestone 6 mg with several cc's of plain xylocaine. Anesthesia was provided by ethyl chloride and a 20-gauge needle was used to inject the shoulder area. A posterior approach was used.  The injection was tolerated well.  A band aid dressing was applied.  The patient was advised to apply ice later today and tomorrow to the injection sight as needed.   PROCEDURE NOTE:  The patient request injection, verbal consent was obtained.  The left trochanteric area of the hip was prepped appropriately after time out was performed.   Sterile technique was observed and injection of 1 cc of Celestone 6 mg with several cc's of plain xylocaine. Anesthesia was provided by ethyl chloride and a 20-gauge needle was used to inject the hip area. The injection was tolerated well.  A band aid dressing was applied.  The patient was advised to apply ice later today and tomorrow to the injection sight as needed.   Encounter Diagnoses  Name Primary?   Chronic pain of right knee Yes   Trochanteric bursitis, left hip    Cigarette nicotine dependence without complication    Return in one month.  I have reviewed the [place] web site prior to prescribing narcotic medicine for this patient.  Call if any problem.  Precautions discussed.  Electronically Signed Sanjuana Kava, MD 9/8/20229:51 AM

## 2021-08-03 ENCOUNTER — Other Ambulatory Visit: Payer: Self-pay

## 2021-08-03 ENCOUNTER — Telehealth: Payer: Self-pay | Admitting: Radiology

## 2021-08-03 ENCOUNTER — Ambulatory Visit (INDEPENDENT_AMBULATORY_CARE_PROVIDER_SITE_OTHER): Payer: 59 | Admitting: Orthopaedic Surgery

## 2021-08-03 ENCOUNTER — Encounter: Payer: Self-pay | Admitting: Orthopaedic Surgery

## 2021-08-03 DIAGNOSIS — G8929 Other chronic pain: Secondary | ICD-10-CM | POA: Diagnosis not present

## 2021-08-03 DIAGNOSIS — M7062 Trochanteric bursitis, left hip: Secondary | ICD-10-CM | POA: Diagnosis not present

## 2021-08-03 DIAGNOSIS — M25511 Pain in right shoulder: Secondary | ICD-10-CM

## 2021-08-03 MED ORDER — HYDROCODONE-ACETAMINOPHEN 7.5-325 MG PO TABS: 1.0000 | ORAL_TABLET | Freq: Four times a day (QID) | ORAL | 0 refills | Status: DC | PRN

## 2021-08-03 NOTE — Telephone Encounter (Signed)
Patient called, said that the pharmacy will not give her her medication prescribed today until 10/14.  She picked up her last Rx on 09/08, not sure why they will not give her the meds today.  Asking you to reach out to pharmacy and call her to advise.  [facility] on [address].

## 2021-08-03 NOTE — Progress Notes (Addendum)
PROCEDURE NOTE:  The patient request injection, verbal consent was obtained.  The right shoulder was prepped appropriately after time out was performed.   Sterile technique was observed and injection of 1 cc of Celestone 6 mg with several cc's of plain xylocaine. Anesthesia was provided by ethyl chloride and a 20-gauge needle was used to inject the shoulder area. A posterior approach was used.  The injection was tolerated well.  A band aid dressing was applied.  The patient was advised to apply ice later today and tomorrow to the injection sight as needed.  PROCEDURE NOTE:  The patient request injection, verbal consent was obtained.  The left trochanteric area of the hip was prepped appropriately after time out was performed.   Sterile technique was observed and injection of 1 cc of Celestone 6 mg with several cc's of plain xylocaine. Anesthesia was provided by ethyl chloride and a 20-gauge needle was used to inject the hip area. The injection was tolerated well.  A band aid dressing was applied.  The patient was advised to apply ice later today and tomorrow to the injection sight as needed.  Encounter Diagnoses  Name Primary?   Chronic pain of right knee Yes   Trochanteric bursitis, left hip    Chronic pain in right shoulder     I refilled pain medicine.  I have reviewed the [place] web site prior to prescribing narcotic medicine for this patient.  Return in one month.  She is going to [facility] to fish this weekend.  Call if any problem.  Precautions discussed.  Electronically Signed Sanjuana Kava, MD 10/6/20228:32 AM

## 2021-08-31 ENCOUNTER — Ambulatory Visit: Payer: 59 | Admitting: Orthopaedic Surgery

## 2021-09-03 ENCOUNTER — Other Ambulatory Visit: Payer: Self-pay | Admitting: Adult Health

## 2021-09-07 ENCOUNTER — Other Ambulatory Visit: Payer: Self-pay

## 2021-09-07 ENCOUNTER — Encounter: Payer: Self-pay | Admitting: Orthopaedic Surgery

## 2021-09-07 ENCOUNTER — Ambulatory Visit (INDEPENDENT_AMBULATORY_CARE_PROVIDER_SITE_OTHER): Payer: 59 | Admitting: Orthopaedic Surgery

## 2021-09-07 DIAGNOSIS — M25511 Pain in right shoulder: Secondary | ICD-10-CM | POA: Diagnosis not present

## 2021-09-07 DIAGNOSIS — M7062 Trochanteric bursitis, left hip: Secondary | ICD-10-CM | POA: Diagnosis not present

## 2021-09-07 DIAGNOSIS — G8929 Other chronic pain: Secondary | ICD-10-CM | POA: Diagnosis not present

## 2021-09-07 DIAGNOSIS — F17210 Nicotine dependence, cigarettes, uncomplicated: Secondary | ICD-10-CM

## 2021-09-07 NOTE — Progress Notes (Addendum)
PROCEDURE NOTE:  The patient request injection, verbal consent was obtained.  The left trochanteric area of the hip was prepped appropriately after time out was performed.   Sterile technique was observed and injection of 1 cc of DepoMedrol 40 mg with several cc's of plain xylocaine. Anesthesia was provided by ethyl chloride and a 20-gauge needle was used to inject the hip area. The injection was tolerated well.  A band aid dressing was applied.  The patient was advised to apply ice later today and tomorrow to the injection sight as needed.  PROCEDURE NOTE:  The patient request injection, verbal consent was obtained.  The right shoulder was prepped appropriately after time out was performed.   Sterile technique was observed and injection of 1 cc of DepoMedrol 40mg  with several cc's of plain xylocaine. Anesthesia was provided by ethyl chloride and a 20-gauge needle was used to inject the shoulder area. A posterior approach was used.  The injection was tolerated well.  A band aid dressing was applied.  The patient was advised to apply ice later today and tomorrow to the injection sight as needed.   Encounter Diagnoses  Name Primary?   Trochanteric bursitis, left hip Yes   Cigarette nicotine dependence without complication    Chronic right shoulder pain      Return in one month.  Call if any problem.  Precautions discussed.  Electronically Signed Sanjuana Kava, MD 11/10/20229:40 AM

## 2021-09-08 ENCOUNTER — Telehealth: Payer: Self-pay

## 2021-09-08 NOTE — Telephone Encounter (Signed)
Patient called stating her hip is "killing her" and she can barely walk. I did advise her that provider is not in the office until Tuesday, but it may take a couple of days for the injection to work. Will call back if not feeling better.

## 2021-09-11 ENCOUNTER — Telehealth: Payer: Self-pay

## 2021-09-11 MED ORDER — HYDROCODONE-ACETAMINOPHEN 7.5-325 MG PO TABS: 1.0000 | ORAL_TABLET | Freq: Four times a day (QID) | ORAL | 0 refills | Status: DC | PRN

## 2021-09-11 NOTE — Telephone Encounter (Signed)
Hydrocodone-Acetaminophen  7.5/325 mg  Qty 130 Tablets  PATIENT USES [facility] ON [address]

## 2021-10-03 ENCOUNTER — Ambulatory Visit (INDEPENDENT_AMBULATORY_CARE_PROVIDER_SITE_OTHER): Payer: 59 | Admitting: Orthopaedic Surgery

## 2021-10-03 ENCOUNTER — Other Ambulatory Visit: Payer: Self-pay

## 2021-10-03 ENCOUNTER — Encounter: Payer: Self-pay | Admitting: Orthopaedic Surgery

## 2021-10-03 DIAGNOSIS — M25511 Pain in right shoulder: Secondary | ICD-10-CM | POA: Diagnosis not present

## 2021-10-03 DIAGNOSIS — G8929 Other chronic pain: Secondary | ICD-10-CM

## 2021-10-03 DIAGNOSIS — M7062 Trochanteric bursitis, left hip: Secondary | ICD-10-CM | POA: Diagnosis not present

## 2021-10-03 DIAGNOSIS — F17210 Nicotine dependence, cigarettes, uncomplicated: Secondary | ICD-10-CM

## 2021-10-03 NOTE — Progress Notes (Signed)
PROCEDURE NOTE:  The patient request injection, verbal consent was obtained.  The right shoulder was prepped appropriately after time out was performed.   Sterile technique was observed and injection of 1 cc of DepoMedrol 40mg  with several cc's of plain xylocaine. Anesthesia was provided by ethyl chloride and a 20-gauge needle was used to inject the shoulder area. A posterior approach was used.  The injection was tolerated well.  A band aid dressing was applied.  The patient was advised to apply ice later today and tomorrow to the injection sight as needed.  PROCEDURE NOTE:  The patient request injection, verbal consent was obtained.  The left trochanteric area of the hip was prepped appropriately after time out was performed.   Sterile technique was observed and injection of 1 cc of DepoMedrol 40 mg with several cc's of plain xylocaine. Anesthesia was provided by ethyl chloride and a 20-gauge needle was used to inject the hip area. The injection was tolerated well.  A band aid dressing was applied.  The patient was advised to apply ice later today and tomorrow to the injection sight as needed.  Encounter Diagnoses  Name Primary?   Trochanteric bursitis, left hip Yes   Chronic right shoulder pain    Cigarette nicotine dependence without complication    Return in one month.  Call if any problem.  Precautions discussed.  Electronically Signed Sanjuana Kava, MD 12/6/20229:35 AM

## 2021-10-05 ENCOUNTER — Ambulatory Visit: Payer: 59 | Admitting: Orthopaedic Surgery

## 2021-10-11 ENCOUNTER — Telehealth: Payer: Self-pay | Admitting: Orthopaedic Surgery

## 2021-10-11 MED ORDER — HYDROCODONE-ACETAMINOPHEN 7.5-325 MG PO TABS: 1.0000 | ORAL_TABLET | Freq: Four times a day (QID) | ORAL | 0 refills | Status: DC | PRN

## 2021-10-11 NOTE — Telephone Encounter (Signed)
Patient requests refill: HYDROcodone-acetaminophen (NORCO) 7.5-325 MG tablet 130 tablet     - [facility], [address] [place]

## 2021-10-11 NOTE — Addendum Note (Signed)
Addended by: Willette Pa on: 10/11/2021 05:58 PM   Modules accepted: Orders

## 2021-10-18 ENCOUNTER — Ambulatory Visit: Payer: 59 | Admitting: Orthopedic Surgery

## 2021-10-31 ENCOUNTER — Encounter: Payer: Self-pay | Admitting: Orthopaedic Surgery

## 2021-10-31 ENCOUNTER — Ambulatory Visit: Payer: 59 | Admitting: Orthopaedic Surgery

## 2021-10-31 ENCOUNTER — Other Ambulatory Visit: Payer: Self-pay

## 2021-10-31 DIAGNOSIS — M25511 Pain in right shoulder: Secondary | ICD-10-CM

## 2021-10-31 DIAGNOSIS — G8929 Other chronic pain: Secondary | ICD-10-CM

## 2021-10-31 DIAGNOSIS — M7062 Trochanteric bursitis, left hip: Secondary | ICD-10-CM

## 2021-10-31 DIAGNOSIS — F17210 Nicotine dependence, cigarettes, uncomplicated: Secondary | ICD-10-CM

## 2021-10-31 NOTE — Progress Notes (Signed)
PROCEDURE NOTE:  The patient request injection, verbal consent was obtained.  The left trochanteric area of the hip was prepped appropriately after time out was performed.   Sterile technique was observed and injection of 1 cc of DepoMedrol 40mg  with several cc's of plain xylocaine. Anesthesia was provided by ethyl chloride and a 20-gauge needle was used to inject the hip area. The injection was tolerated well.  A band aid dressing was applied.  The patient was advised to apply ice later today and tomorrow to the injection sight as needed.  PROCEDURE NOTE:  The patient request injection, verbal consent was obtained.  The right shoulder was prepped appropriately after time out was performed.   Sterile technique was observed and injection of 1 cc of DepoMedrol 40mg  with several cc's of plain xylocaine. Anesthesia was provided by ethyl chloride and a 20-gauge needle was used to inject the shoulder area. A posterior approach was used.  The injection was tolerated well.  A band aid dressing was applied.  The patient was advised to apply ice later today and tomorrow to the injection sight as needed.    Encounter Diagnoses  Name Primary?   Trochanteric bursitis, left hip Yes   Chronic right shoulder pain    Cigarette nicotine dependence without complication    Return in one month.  Call if any problem.  Precautions discussed.  Electronically Signed Sanjuana Kava, MD 1/3/20238:18 AM

## 2021-11-09 ENCOUNTER — Telehealth: Payer: Self-pay | Admitting: Orthopaedic Surgery

## 2021-11-09 NOTE — Telephone Encounter (Signed)
Patient requests refill, pain medication: HYDROcodone-acetaminophen (NORCO) 7.5-325 MG tablet 130 tablet              [facility], [place]

## 2021-11-14 MED ORDER — HYDROCODONE-ACETAMINOPHEN 7.5-325 MG PO TABS: 1.0000 | ORAL_TABLET | Freq: Four times a day (QID) | ORAL | 0 refills | Status: DC | PRN

## 2021-11-14 NOTE — Addendum Note (Signed)
Addended by: Willette Pa on: 11/14/2021 10:30 AM   Modules accepted: Orders

## 2021-11-15 ENCOUNTER — Other Ambulatory Visit: Payer: Self-pay | Admitting: Adult Health

## 2021-11-23 ENCOUNTER — Telehealth: Payer: Self-pay | Admitting: Orthopaedic Surgery

## 2021-11-23 NOTE — Telephone Encounter (Signed)
Judith Rodriguez is aware. 

## 2021-11-23 NOTE — Telephone Encounter (Addendum)
Patient called to ask if she may have an epidural injection at Dr Maryjean Ka' office; said back is really hurting. States her previous insurance did not cover it. I relayed she is due for appointment 11/28/21, this coming Tuesday, and documentation may be needed in order to request epidural, particularly for new insurance. Relayed that I would inquire. Please advise.

## 2021-11-23 NOTE — Telephone Encounter (Signed)
Okay, I can let patient know. Will you be entering the orders?

## 2021-11-28 ENCOUNTER — Ambulatory Visit: Payer: 59

## 2021-11-28 ENCOUNTER — Ambulatory Visit (INDEPENDENT_AMBULATORY_CARE_PROVIDER_SITE_OTHER): Payer: 59 | Admitting: Orthopaedic Surgery

## 2021-11-28 ENCOUNTER — Encounter: Payer: Self-pay | Admitting: Orthopaedic Surgery

## 2021-11-28 ENCOUNTER — Other Ambulatory Visit: Payer: Self-pay

## 2021-11-28 DIAGNOSIS — M7062 Trochanteric bursitis, left hip: Secondary | ICD-10-CM | POA: Diagnosis not present

## 2021-11-28 DIAGNOSIS — M5442 Lumbago with sciatica, left side: Secondary | ICD-10-CM

## 2021-11-28 DIAGNOSIS — M25511 Pain in right shoulder: Secondary | ICD-10-CM | POA: Diagnosis not present

## 2021-11-28 DIAGNOSIS — G8929 Other chronic pain: Secondary | ICD-10-CM | POA: Diagnosis not present

## 2021-11-28 MED ORDER — PREDNISONE 5 MG (21) PO TBPK: ORAL_TABLET | ORAL | 0 refills | Status: DC

## 2021-11-28 NOTE — Progress Notes (Signed)
My hip hurts into my groin.  She has left hip pain and lower back pain.   She has pain running down the left leg to the lateral foot.  She has no trauma.  Pain medicine is not hurting.  She also has pain to the left groin area.  Left hip motion is full.  Slight limp left.  NV intact.  Spine/Pelvis examination:  Inspection:  Overall, sacoiliac joint benign and hips nontender; without crepitus or defects.   Thoracic spine inspection: Alignment normal without kyphosis present   Lumbar spine inspection:  Alignment  with normal lumbar lordosis, without scoliosis apparent.   Thoracic spine palpation:  without tenderness of spinal processes   Lumbar spine palpation: without tenderness of lumbar area; without tightness of lumbar muscles    Range of Motion:   Lumbar flexion, forward flexion is normal without pain or tenderness    Lumbar extension is full without pain or tenderness   Left lateral bend is normal without pain or tenderness   Right lateral bend is normal without pain or tenderness   Straight leg raising is normal  Strength & tone: normal   Stability overall normal stability  Right shoulder with pain in the extremes.  NV intact.  Encounter Diagnoses  Name Primary?   Trochanteric bursitis, left hip Yes   Chronic left-sided low back pain with left-sided sciatica    Chronic pain of right knee    PROCEDURE NOTE:  The patient request injection, verbal consent was obtained.  The right shoulder was prepped appropriately after time out was performed.   Sterile technique was observed and injection of 1 cc of DepoMedrol 40mg  with several cc's of plain xylocaine. Anesthesia was provided by ethyl chloride and a 20-gauge needle was used to inject the shoulder area. A posterior approach was used.  The injection was tolerated well.  A band aid dressing was applied.  The patient was advised to apply ice later today and tomorrow to the injection sight as needed.  PROCEDURE  NOTE:  The patient request injection, verbal consent was obtained.  The left trochanteric area of the hip was prepped appropriately after time out was performed.   Sterile technique was observed and injection of 1 cc of Celestone 6 mg with several cc's of plain xylocaine. Anesthesia was provided by ethyl chloride and a 20-gauge needle was used to inject the hip area. The injection was tolerated well.  A band aid dressing was applied.  The patient was advised to apply ice later today and tomorrow to the injection sight as needed.  I will give prednisone dose pack to take in a week if her pain persists.  Continue her pain medicine.  She may need MRI of the lumbar spine.  Call if any problem.  Precautions discussed.  Electronically Signed Sanjuana Kava, MD 1/31/202310:58 AM

## 2021-11-28 NOTE — Patient Instructions (Signed)

## 2021-12-14 ENCOUNTER — Telehealth: Payer: Self-pay

## 2021-12-14 MED ORDER — OXYCODONE-ACETAMINOPHEN 5-325 MG PO TABS: 1.0000 | ORAL_TABLET | Freq: Four times a day (QID) | ORAL | 0 refills | Status: DC | PRN

## 2021-12-14 NOTE — Telephone Encounter (Signed)
Patient is calling for a refill. You were going to switch from Hydrocodone to Oxycodone per my note from last visit.  PATIENT USES [facility] ON [address]

## 2021-12-26 ENCOUNTER — Ambulatory Visit: Payer: 59 | Admitting: Orthopaedic Surgery

## 2022-01-02 ENCOUNTER — Ambulatory Visit: Payer: 59 | Admitting: Orthopaedic Surgery

## 2022-01-02 ENCOUNTER — Other Ambulatory Visit: Payer: Self-pay

## 2022-01-02 ENCOUNTER — Encounter: Payer: Self-pay | Admitting: Orthopaedic Surgery

## 2022-01-02 VITALS — Ht 64.5 in | Wt 161.0 lb

## 2022-01-02 DIAGNOSIS — G8929 Other chronic pain: Secondary | ICD-10-CM | POA: Diagnosis not present

## 2022-01-02 DIAGNOSIS — M7062 Trochanteric bursitis, left hip: Secondary | ICD-10-CM | POA: Diagnosis not present

## 2022-01-02 DIAGNOSIS — M25511 Pain in right shoulder: Secondary | ICD-10-CM | POA: Diagnosis not present

## 2022-01-02 NOTE — Progress Notes (Signed)
PROCEDURE NOTE: ? ?The patient request injection, verbal consent was obtained. ? ?The right shoulder was prepped appropriately after time out was performed.  ? ?Sterile technique was observed and injection of 1 cc of DepoMedrol '40mg'$  with several cc's of plain xylocaine. Anesthesia was provided by ethyl chloride and a 20-gauge needle was used to inject the shoulder area. A posterior approach was used.  The injection was tolerated well. ? ?A band aid dressing was applied. ? ?The patient was advised to apply ice later today and tomorrow to the injection sight as needed. ? ?PROCEDURE NOTE: ? ?The patient request injection, verbal consent was obtained. ? ?The left trochanteric area of the hip was prepped appropriately after time out was performed.  ? ?Sterile technique was observed and injection of 1 cc of Celestone 6 mg with several cc's of plain xylocaine. Anesthesia was provided by ethyl chloride and a 20-gauge needle was used to inject the hip area. The injection was tolerated well. ? ?A band aid dressing was applied. ? ?The patient was advised to apply ice later today and tomorrow to the injection sight as needed. ? ?Call if any problem. ? ?Precautions discussed. ? ?Electronically Signed ?Sanjuana Kava, MD ?3/7/20238:29 AM ? ?

## 2022-01-10 ENCOUNTER — Telehealth: Payer: Self-pay | Admitting: Orthopaedic Surgery

## 2022-01-10 MED ORDER — OXYCODONE-ACETAMINOPHEN 5-325 MG PO TABS: 1.0000 | ORAL_TABLET | Freq: Four times a day (QID) | ORAL | 0 refills | Status: DC | PRN

## 2022-01-10 NOTE — Telephone Encounter (Signed)
Call received from patiient for refill: ?oxyCODONE-acetaminophen (PERCOCET/ROXICET) 5-325 MG tablet 120 tablet  ?   [facility] on [address], [place] ?

## 2022-01-30 ENCOUNTER — Encounter: Payer: Self-pay | Admitting: Orthopaedic Surgery

## 2022-01-30 ENCOUNTER — Ambulatory Visit: Payer: 59 | Admitting: Orthopaedic Surgery

## 2022-01-30 VITALS — BP 137/75 | HR 89 | Ht 64.0 in | Wt 163.2 lb

## 2022-01-30 DIAGNOSIS — M7062 Trochanteric bursitis, left hip: Secondary | ICD-10-CM | POA: Diagnosis not present

## 2022-01-30 DIAGNOSIS — M25511 Pain in right shoulder: Secondary | ICD-10-CM | POA: Diagnosis not present

## 2022-01-30 DIAGNOSIS — G8929 Other chronic pain: Secondary | ICD-10-CM

## 2022-01-30 DIAGNOSIS — F17210 Nicotine dependence, cigarettes, uncomplicated: Secondary | ICD-10-CM

## 2022-01-30 NOTE — Progress Notes (Addendum)
PROCEDURE NOTE: ? ?The patient request injection, verbal consent was obtained. ? ?The right shoulder was prepped appropriately after time out was performed.  ? ?Sterile technique was observed and injection of 1 cc of DepoMedrol '40mg'$  with several cc's of plain xylocaine. Anesthesia was provided by ethyl chloride and a 20-gauge needle was used to inject the shoulder area. A posterior approach was used.  The injection was tolerated well. ? ?A band aid dressing was applied. ? ?The patient was advised to apply ice later today and tomorrow to the injection sight as needed. ? ? ?PROCEDURE NOTE: ? ?The patient request injection, verbal consent was obtained. ? ?The left trochanteric area of the hip was prepped appropriately after time out was performed.  ? ?Sterile technique was observed and injection of 1 cc of DepoMedrol 40 mg with several cc's of plain xylocaine. Anesthesia was provided by ethyl chloride and a 20-gauge needle was used to inject the hip area. The injection was tolerated well. ? ?A band aid dressing was applied. ? ?The patient was advised to apply ice later today and tomorrow to the injection sight as needed. ? ?Encounter Diagnoses  ?Name Primary?  ? Trochanteric bursitis, left hip Yes  ? Chronic pain in right shoulder   ? Cigarette nicotine dependence without complication   ? ?Return as needed. ? ?Call if any problem. ? ?Precautions discussed. ? ?Electronically Signed ?Sanjuana Kava, MD ?4/4/20238:34 AM ? ?

## 2022-02-08 ENCOUNTER — Telehealth: Payer: Self-pay | Admitting: Radiology

## 2022-02-08 MED ORDER — OXYCODONE-ACETAMINOPHEN 5-325 MG PO TABS: 1.0000 | ORAL_TABLET | Freq: Four times a day (QID) | ORAL | 0 refills | Status: DC | PRN

## 2022-02-08 NOTE — Telephone Encounter (Signed)
Patient called and requested refill of meds.   ?Changed pharmacy in chart for her as requested.- [facility] in [place]. ?

## 2022-03-07 ENCOUNTER — Other Ambulatory Visit: Payer: Self-pay | Admitting: Orthopedic Surgery

## 2022-03-07 ENCOUNTER — Other Ambulatory Visit: Payer: Self-pay | Admitting: Radiology

## 2022-03-07 DIAGNOSIS — G894 Chronic pain syndrome: Secondary | ICD-10-CM

## 2022-03-07 MED ORDER — OXYCODONE-ACETAMINOPHEN 5-325 MG PO TABS: 1.0000 | ORAL_TABLET | Freq: Four times a day (QID) | ORAL | 0 refills | Status: DC | PRN

## 2022-03-07 NOTE — Progress Notes (Signed)
Meds ordered this encounter  ?Medications  ? oxyCODONE-acetaminophen (PERCOCET/ROXICET) 5-325 MG tablet  ?  Sig: Take 1 tablet by mouth every 6 (six) hours as needed for up to 7 days for moderate pain or severe pain (Must last 30 days.).  ?  Dispense:  28 tablet  ?  Refill:  0  ?  The patient's physician is out of the office for 1 month.  I will only prescribe opioids for 1 week.  ? ? ?

## 2022-03-07 NOTE — Telephone Encounter (Signed)
Patient called to request refill of her pain medication.  [facility].   ? ?Can you please advise in Dr Brooke Bonito absence?  Thanks. ?

## 2022-03-07 NOTE — Addendum Note (Signed)
Addended by: Brand Males E on: 03/07/2022 11:37 AM ? ? Modules accepted: Orders ? ?

## 2022-03-14 ENCOUNTER — Other Ambulatory Visit: Payer: Self-pay

## 2022-03-14 DIAGNOSIS — G894 Chronic pain syndrome: Secondary | ICD-10-CM

## 2022-03-14 NOTE — Telephone Encounter (Signed)
Oxycodone-Acetaminophen 5/325 MG  Qty 28 Tablets ?Take 1 tablet by mouth every 6(six) hours as needed for up to 7 days for moderate pain or severe pain. ?(Must last 30 days) ? ?PATIENT USES [facility] IN [place] ?

## 2022-03-15 MED ORDER — OXYCODONE-ACETAMINOPHEN 5-325 MG PO TABS: 1.0000 | ORAL_TABLET | Freq: Four times a day (QID) | ORAL | 0 refills | Status: DC | PRN

## 2022-03-21 ENCOUNTER — Other Ambulatory Visit: Payer: Self-pay | Admitting: Orthopaedic Surgery

## 2022-03-21 DIAGNOSIS — G894 Chronic pain syndrome: Secondary | ICD-10-CM

## 2022-03-21 MED ORDER — OXYCODONE-ACETAMINOPHEN 5-325 MG PO TABS: 1.0000 | ORAL_TABLET | Freq: Four times a day (QID) | ORAL | 0 refills | Status: AC | PRN

## 2022-03-21 NOTE — Telephone Encounter (Signed)
Patient called request refill on her pain medicine   oxyCODONE-acetaminophen (PERCOCET/ROXICET) 5-325 MG tablet  [facility]

## 2022-03-28 ENCOUNTER — Other Ambulatory Visit: Payer: Self-pay | Admitting: Orthopaedic Surgery

## 2022-03-28 NOTE — Telephone Encounter (Signed)
Patient called for a refill on her pain medicine  oxyCODONE-acetaminophen (PERCOCET/ROXICET) 5-325 MG tablet  Pharmacy: [facility] in [place]

## 2022-03-29 MED ORDER — OXYCODONE-ACETAMINOPHEN 5-325 MG PO TABS: 1.0000 | ORAL_TABLET | Freq: Four times a day (QID) | ORAL | 0 refills | Status: DC | PRN

## 2022-04-03 ENCOUNTER — Ambulatory Visit: Payer: 59 | Admitting: Orthopaedic Surgery

## 2022-04-03 ENCOUNTER — Encounter: Payer: Self-pay | Admitting: Orthopaedic Surgery

## 2022-04-03 DIAGNOSIS — M25511 Pain in right shoulder: Secondary | ICD-10-CM

## 2022-04-03 DIAGNOSIS — G894 Chronic pain syndrome: Secondary | ICD-10-CM

## 2022-04-03 DIAGNOSIS — M7062 Trochanteric bursitis, left hip: Secondary | ICD-10-CM

## 2022-04-03 DIAGNOSIS — F17210 Nicotine dependence, cigarettes, uncomplicated: Secondary | ICD-10-CM

## 2022-04-03 DIAGNOSIS — G8929 Other chronic pain: Secondary | ICD-10-CM

## 2022-04-03 MED ORDER — PREDNISONE 5 MG (21) PO TBPK: ORAL_TABLET | ORAL | 0 refills | Status: DC

## 2022-04-03 MED ORDER — OXYCODONE-ACETAMINOPHEN 5-325 MG PO TABS: 1.0000 | ORAL_TABLET | Freq: Four times a day (QID) | ORAL | 0 refills | Status: DC | PRN

## 2022-04-03 NOTE — Progress Notes (Signed)
PROCEDURE NOTE:  The patient request injection, verbal consent was obtained.  The left trochanteric area of the hip was prepped appropriately after time out was performed.   Sterile technique was observed and injection of 1 cc of DepoMedrol '40mg'$  with several cc's of plain xylocaine. Anesthesia was provided by ethyl chloride and a 20-gauge needle was used to inject the hip area. The injection was tolerated well.  A band aid dressing was applied.  The patient was advised to apply ice later today and tomorrow to the injection sight as needed.  PROCEDURE NOTE:  The patient request injection, verbal consent was obtained.  The right shoulder was prepped appropriately after time out was performed.   Sterile technique was observed and injection of 1 cc of DepoMedrol '40mg'$  with several cc's of plain xylocaine. Anesthesia was provided by ethyl chloride and a 20-gauge needle was used to inject the shoulder area. A posterior approach was used.  The injection was tolerated well.  A band aid dressing was applied.  The patient was advised to apply ice later today and tomorrow to the injection sight as needed.   Encounter Diagnoses  Name Primary?   Chronic pain syndrome Yes   Trochanteric bursitis, left hip    Chronic pain in right shoulder    Cigarette nicotine dependence without complication    I have reviewed the [place] web site prior to prescribing narcotic medicine for this patient.  I will give prednisone dose pack.  Return in five weeks.  Call if any problem.  Precautions discussed.  Electronically Signed Sanjuana Kava, MD 6/6/20239:10 AM

## 2022-04-24 DIAGNOSIS — Z6826 Body mass index (BMI) 26.0-26.9, adult: Secondary | ICD-10-CM | POA: Diagnosis not present

## 2022-04-24 DIAGNOSIS — J019 Acute sinusitis, unspecified: Secondary | ICD-10-CM | POA: Diagnosis not present

## 2022-04-24 DIAGNOSIS — R69 Illness, unspecified: Secondary | ICD-10-CM | POA: Diagnosis not present

## 2022-04-30 ENCOUNTER — Other Ambulatory Visit: Payer: Self-pay | Admitting: Orthopaedic Surgery

## 2022-04-30 NOTE — Telephone Encounter (Signed)
Patient called to request refill - aware while Dr Luna Glasgow is out of clinic, that requests are being reviewed by one of our other providers; patient aware of appointment with Dr Luna Glasgow on 05/08/22: oxyCODONE-acetaminophen (PERCOCET/ROXICET) 5-325 MG tablet  [place]

## 2022-05-02 MED ORDER — OXYCODONE-ACETAMINOPHEN 5-325 MG PO TABS: 1.0000 | ORAL_TABLET | Freq: Four times a day (QID) | ORAL | 0 refills | Status: DC | PRN

## 2022-05-08 ENCOUNTER — Encounter: Payer: Self-pay | Admitting: Orthopaedic Surgery

## 2022-05-08 ENCOUNTER — Ambulatory Visit: Payer: 59 | Admitting: Orthopaedic Surgery

## 2022-05-08 DIAGNOSIS — M7062 Trochanteric bursitis, left hip: Secondary | ICD-10-CM

## 2022-05-08 DIAGNOSIS — M25511 Pain in right shoulder: Secondary | ICD-10-CM | POA: Diagnosis not present

## 2022-05-08 DIAGNOSIS — G8929 Other chronic pain: Secondary | ICD-10-CM | POA: Diagnosis not present

## 2022-05-08 DIAGNOSIS — F17210 Nicotine dependence, cigarettes, uncomplicated: Secondary | ICD-10-CM

## 2022-05-08 DIAGNOSIS — G894 Chronic pain syndrome: Secondary | ICD-10-CM

## 2022-05-08 MED ORDER — OXYCODONE-ACETAMINOPHEN 7.5-325 MG PO TABS: 1.0000 | ORAL_TABLET | Freq: Four times a day (QID) | ORAL | 0 refills | Status: DC | PRN

## 2022-05-08 NOTE — Progress Notes (Signed)
PROCEDURE NOTE:  The patient request injection, verbal consent was obtained.  The left trochanteric area of the hip was prepped appropriately after time out was performed.   Sterile technique was observed and injection of 1 cc of Celestone 6 mg with several cc's of plain xylocaine. Anesthesia was provided by ethyl chloride and a 20-gauge needle was used to inject the hip area. The injection was tolerated well.  A band aid dressing was applied.  The patient was advised to apply ice later today and tomorrow to the injection sight as needed.  PROCEDURE NOTE:  The patient request injection, verbal consent was obtained.  The right shoulder was prepped appropriately after time out was performed.   Sterile technique was observed and injection of 1 cc of DepoMedrol '40mg'$  with several cc's of plain xylocaine. Anesthesia was provided by ethyl chloride and a 20-gauge needle was used to inject the shoulder area. A posterior approach was used.  The injection was tolerated well.  A band aid dressing was applied.  The patient was advised to apply ice later today and tomorrow to the injection sight as needed.  Encounter Diagnoses  Name Primary?   Chronic pain syndrome Yes   Trochanteric bursitis, left hip    Chronic pain in right shoulder    Cigarette nicotine dependence without complication    I have reviewed the [place] web site prior to prescribing narcotic medicine for this patient.  Return in one month.  Call if any problem.  Precautions discussed.  Electronically Signed Sanjuana Kava, MD 7/11/20238:21 AM

## 2022-05-31 ENCOUNTER — Encounter: Payer: Self-pay | Admitting: Orthopaedic Surgery

## 2022-05-31 ENCOUNTER — Telehealth: Payer: Self-pay | Admitting: Radiology

## 2022-05-31 ENCOUNTER — Ambulatory Visit (INDEPENDENT_AMBULATORY_CARE_PROVIDER_SITE_OTHER): Payer: 59 | Admitting: Orthopaedic Surgery

## 2022-05-31 DIAGNOSIS — M25511 Pain in right shoulder: Secondary | ICD-10-CM | POA: Diagnosis not present

## 2022-05-31 DIAGNOSIS — G8929 Other chronic pain: Secondary | ICD-10-CM | POA: Diagnosis not present

## 2022-05-31 DIAGNOSIS — G894 Chronic pain syndrome: Secondary | ICD-10-CM

## 2022-05-31 DIAGNOSIS — F17210 Nicotine dependence, cigarettes, uncomplicated: Secondary | ICD-10-CM

## 2022-05-31 MED ORDER — METHYLPREDNISOLONE ACETATE 40 MG/ML IJ SUSP
40.0000 mg | Freq: Once | INTRAMUSCULAR | Status: AC
  Administered 2022-05-31: 40 mg via INTRAMUSCULAR

## 2022-05-31 NOTE — Addendum Note (Signed)
Addended by: Willette Pa on: 05/31/2022 10:15 AM   Modules accepted: Orders

## 2022-05-31 NOTE — Progress Notes (Signed)
PROCEDURE NOTE:  The patient request injection, verbal consent was obtained.  The right shoulder was prepped appropriately after time out was performed.   Sterile technique was observed and injection of 1 cc of DepoMedrol '40mg'$  with several cc's of plain xylocaine. Anesthesia was provided by ethyl chloride and a 20-gauge needle was used to inject the shoulder area. A posterior approach was used.  The injection was tolerated well.  A band aid dressing was applied.  The patient was advised to apply ice later today and tomorrow to the injection sight as needed.  Encounter Diagnoses  Name Primary?   Chronic pain in right shoulder Yes   Chronic pain syndrome    Cigarette nicotine dependence without complication    Return Tuesday for hip exam. Call if any problem.  Precautions discussed.  Electronically Signed Sanjuana Kava, MD 8/3/20239:17 AM

## 2022-05-31 NOTE — Telephone Encounter (Signed)
Left message for patient to call back and verify pharmacy for [place]

## 2022-05-31 NOTE — Addendum Note (Signed)
Addended byCandice Camp on: 05/31/2022 09:37 AM   Modules accepted: Orders

## 2022-06-05 ENCOUNTER — Encounter: Payer: Self-pay | Admitting: Orthopaedic Surgery

## 2022-06-05 ENCOUNTER — Telehealth: Payer: Self-pay

## 2022-06-05 ENCOUNTER — Ambulatory Visit: Payer: 59 | Admitting: Orthopaedic Surgery

## 2022-06-05 DIAGNOSIS — M7062 Trochanteric bursitis, left hip: Secondary | ICD-10-CM

## 2022-06-05 DIAGNOSIS — G894 Chronic pain syndrome: Secondary | ICD-10-CM

## 2022-06-05 MED ORDER — OXYCODONE-ACETAMINOPHEN 7.5-325 MG PO TABS: 1.0000 | ORAL_TABLET | Freq: Four times a day (QID) | ORAL | 0 refills | Status: DC | PRN

## 2022-06-05 NOTE — Progress Notes (Signed)
PROCEDURE NOTE:  The patient request injection, verbal consent was obtained.  The left trochanteric area of the hip was prepped appropriately after time out was performed.   Sterile technique was observed and injection of 1 cc of DepoMedrol 40 mg with several cc's of plain xylocaine. Anesthesia was provided by ethyl chloride and a 20-gauge needle was used to inject the hip area. The injection was tolerated well.  A band aid dressing was applied.  The patient was advised to apply ice later today and tomorrow to the injection sight as needed.  Encounter Diagnoses  Name Primary?   Trochanteric bursitis, left hip Yes   Chronic pain syndrome    Return in one month.  Call if any problem.  Precautions discussed.  Electronically Signed Sanjuana Kava, MD 8/8/20238:27 AM

## 2022-06-05 NOTE — Telephone Encounter (Signed)
Oxycodone-Acetaminophen  7.5/325 MG  Qty 100 Tablets  PATIENT USES [facility] [place]  Patient is asking if you could put her amount back to 120 tablets.

## 2022-07-03 ENCOUNTER — Ambulatory Visit (INDEPENDENT_AMBULATORY_CARE_PROVIDER_SITE_OTHER): Payer: 59 | Admitting: Orthopaedic Surgery

## 2022-07-03 ENCOUNTER — Encounter: Payer: Self-pay | Admitting: Orthopaedic Surgery

## 2022-07-03 DIAGNOSIS — F17210 Nicotine dependence, cigarettes, uncomplicated: Secondary | ICD-10-CM

## 2022-07-03 DIAGNOSIS — M25511 Pain in right shoulder: Secondary | ICD-10-CM

## 2022-07-03 DIAGNOSIS — G894 Chronic pain syndrome: Secondary | ICD-10-CM

## 2022-07-03 DIAGNOSIS — G8929 Other chronic pain: Secondary | ICD-10-CM

## 2022-07-03 DIAGNOSIS — M7062 Trochanteric bursitis, left hip: Secondary | ICD-10-CM

## 2022-07-03 MED ORDER — OXYCODONE-ACETAMINOPHEN 7.5-325 MG PO TABS
1.0000 | ORAL_TABLET | Freq: Four times a day (QID) | ORAL | 0 refills | Status: DC | PRN
Start: 2022-07-03 — End: 2022-07-31

## 2022-07-03 MED ORDER — METHYLPREDNISOLONE ACETATE 40 MG/ML IJ SUSP
40.0000 mg | Freq: Once | INTRAMUSCULAR | Status: AC
  Administered 2022-07-03: 40 mg via INTRA_ARTICULAR

## 2022-07-03 NOTE — Progress Notes (Signed)
PROCEDURE NOTE:  The patient request injection, verbal consent was obtained.  The left trochanteric area of the hip was prepped appropriately after time out was performed.   Sterile technique was observed and injection of 1 cc of Celestone 6 mg with several cc's of plain xylocaine. Anesthesia was provided by ethyl chloride and a 20-gauge needle was used to inject the hip area. The injection was tolerated well.  A band aid dressing was applied.  The patient was advised to apply ice later today and tomorrow to the injection sight as needed.  PROCEDURE NOTE:  The patient requests injections of the right knee , verbal consent was obtained.  The right knee was prepped appropriately after time out was performed.   Sterile technique was observed and injection of 1 cc of DepoMedrol '40mg'$  with several cc's of plain xylocaine. Anesthesia was provided by ethyl chloride and a 20-gauge needle was used to inject the knee area. The injection was tolerated well.  A band aid dressing was applied.  The patient was advised to apply ice later today and tomorrow to the injection sight as needed.  Encounter Diagnoses  Name Primary?   Trochanteric bursitis, left hip Yes   Chronic pain syndrome    Chronic pain in right shoulder    Cigarette nicotine dependence without complication    I have reviewed the [place] web site prior to prescribing narcotic medicine for this patient.  Return in one month.  Call if any problem.  Precautions discussed.  Electronically Signed Sanjuana Kava, MD 9/5/20239:05 AM

## 2022-07-03 NOTE — Addendum Note (Signed)
Addended by: Obie Dredge A on: 07/03/2022 11:18 AM   Modules accepted: Orders

## 2022-07-31 ENCOUNTER — Encounter: Payer: Self-pay | Admitting: Orthopaedic Surgery

## 2022-07-31 ENCOUNTER — Ambulatory Visit (INDEPENDENT_AMBULATORY_CARE_PROVIDER_SITE_OTHER): Payer: 59 | Admitting: Orthopaedic Surgery

## 2022-07-31 DIAGNOSIS — M7062 Trochanteric bursitis, left hip: Secondary | ICD-10-CM

## 2022-07-31 DIAGNOSIS — G894 Chronic pain syndrome: Secondary | ICD-10-CM | POA: Diagnosis not present

## 2022-07-31 DIAGNOSIS — F17210 Nicotine dependence, cigarettes, uncomplicated: Secondary | ICD-10-CM

## 2022-07-31 DIAGNOSIS — G8929 Other chronic pain: Secondary | ICD-10-CM

## 2022-07-31 DIAGNOSIS — M25511 Pain in right shoulder: Secondary | ICD-10-CM | POA: Diagnosis not present

## 2022-07-31 MED ORDER — METHYLPREDNISOLONE ACETATE 40 MG/ML IJ SUSP
40.0000 mg | Freq: Once | INTRAMUSCULAR | Status: AC
  Administered 2022-07-31: 40 mg via INTRA_ARTICULAR

## 2022-07-31 MED ORDER — OXYCODONE-ACETAMINOPHEN 7.5-325 MG PO TABS: 1.0000 | ORAL_TABLET | Freq: Four times a day (QID) | ORAL | 0 refills | Status: DC | PRN

## 2022-07-31 NOTE — Progress Notes (Signed)
PROCEDURE NOTE:  The patient request injection, verbal consent was obtained.  The right shoulder was prepped appropriately after time out was performed.   Sterile technique was observed and injection of 1 cc of DepoMedrol '40mg'$  with several cc's of plain xylocaine. Anesthesia was provided by ethyl chloride and a 20-gauge needle was used to inject the shoulder area. A posterior approach was used.  The injection was tolerated well.  A band aid dressing was applied.  The patient was advised to apply ice later today and tomorrow to the injection sight as needed.   PROCEDURE NOTE:  The patient request injection, verbal consent was obtained.  The left trochanteric area of the hip was prepped appropriately after time out was performed.   Sterile technique was observed and injection of 1 cc of DepoMedrol 40 mg with several cc's of plain xylocaine. Anesthesia was provided by ethyl chloride and a 20-gauge needle was used to inject the hip area. The injection was tolerated well.  A band aid dressing was applied.  The patient was advised to apply ice later today and tomorrow to the injection sight as needed.  Encounter Diagnoses  Name Primary?   Trochanteric bursitis, left hip Yes   Chronic pain in right shoulder    Chronic pain syndrome    Cigarette nicotine dependence without complication    Return in one month.  I have reviewed the [place] web site prior to prescribing narcotic medicine for this patient.  Call if any problem.  Precautions discussed.  Electronically Signed Sanjuana Kava, MD 10/3/20238:37 AM

## 2022-07-31 NOTE — Addendum Note (Signed)
Addended by: Obie Dredge A on: 07/31/2022 08:55 AM   Modules accepted: Orders

## 2022-08-28 ENCOUNTER — Encounter: Payer: Self-pay | Admitting: Orthopaedic Surgery

## 2022-08-28 ENCOUNTER — Ambulatory Visit (INDEPENDENT_AMBULATORY_CARE_PROVIDER_SITE_OTHER): Payer: 59 | Admitting: Orthopaedic Surgery

## 2022-08-28 DIAGNOSIS — G8929 Other chronic pain: Secondary | ICD-10-CM | POA: Diagnosis not present

## 2022-08-28 DIAGNOSIS — M25511 Pain in right shoulder: Secondary | ICD-10-CM

## 2022-08-28 DIAGNOSIS — M7062 Trochanteric bursitis, left hip: Secondary | ICD-10-CM | POA: Diagnosis not present

## 2022-08-28 MED ORDER — METHYLPREDNISOLONE ACETATE 40 MG/ML IJ SUSP
40.0000 mg | Freq: Once | INTRAMUSCULAR | Status: AC
  Administered 2022-08-28: 40 mg via INTRA_ARTICULAR

## 2022-08-28 MED ORDER — OXYCODONE-ACETAMINOPHEN 7.5-325 MG PO TABS: 1.0000 | ORAL_TABLET | Freq: Four times a day (QID) | ORAL | 0 refills | Status: DC | PRN

## 2022-08-28 NOTE — Progress Notes (Signed)
PROCEDURE NOTE:  The patient request injection, verbal consent was obtained.  The right shoulder was prepped appropriately after time out was performed.   Sterile technique was observed and injection of 1 cc of DepoMedrol '40mg'$  with several cc's of plain xylocaine. Anesthesia was provided by ethyl chloride and a 20-gauge needle was used to inject the shoulder area. A posterior approach was used.  The injection was tolerated well.  A band aid dressing was applied.  The patient was advised to apply ice later today and tomorrow to the injection sight as needed.   PROCEDURE NOTE:  The patient request injection, verbal consent was obtained.  The left trochanteric area of the hip was prepped appropriately after time out was performed.   Sterile technique was observed and injection of 1 cc of DepoMedrol 40 mg with several cc's of plain xylocaine. Anesthesia was provided by ethyl chloride and a 20-gauge needle was used to inject the hip area. The injection was tolerated well.  A band aid dressing was applied.  The patient was advised to apply ice later today and tomorrow to the injection sight as needed.  Encounter Diagnoses  Name Primary?   Trochanteric bursitis, left hip Yes   Chronic pain in right shoulder    Return in one month.  I have reviewed the [place] web site prior to prescribing narcotic medicine for this patient.  Call if any problem.  Precautions discussed.  Electronically Signed Sanjuana Kava, MD 10/31/20238:33 AM

## 2022-09-13 ENCOUNTER — Telehealth: Payer: Self-pay | Admitting: Orthopaedic Surgery

## 2022-09-25 ENCOUNTER — Encounter: Payer: Self-pay | Admitting: Orthopaedic Surgery

## 2022-09-25 ENCOUNTER — Ambulatory Visit (INDEPENDENT_AMBULATORY_CARE_PROVIDER_SITE_OTHER): Payer: 59

## 2022-09-25 ENCOUNTER — Ambulatory Visit: Payer: 59 | Admitting: Orthopaedic Surgery

## 2022-09-25 DIAGNOSIS — G8929 Other chronic pain: Secondary | ICD-10-CM | POA: Diagnosis not present

## 2022-09-25 DIAGNOSIS — M25511 Pain in right shoulder: Secondary | ICD-10-CM | POA: Diagnosis not present

## 2022-09-25 DIAGNOSIS — M25561 Pain in right knee: Secondary | ICD-10-CM

## 2022-09-25 DIAGNOSIS — M7062 Trochanteric bursitis, left hip: Secondary | ICD-10-CM | POA: Diagnosis not present

## 2022-09-25 MED ORDER — DIAZEPAM 10 MG PO TABS: 10.0000 mg | ORAL_TABLET | Freq: Once | ORAL | 0 refills | Status: DC

## 2022-09-25 MED ORDER — OXYCODONE-ACETAMINOPHEN 7.5-325 MG PO TABS: 1.0000 | ORAL_TABLET | Freq: Four times a day (QID) | ORAL | 0 refills | Status: DC | PRN

## 2022-09-25 NOTE — Progress Notes (Signed)
My knee is hurting too.  She had giving way of the right knee several times since I have last seen her.  She has swelling and popping.  She has no trauma, no redness.  It it not getting better.  Her right shoulder and left hip hurt and she want injections for them.  Right knee has effusion, crepitus, ROM 0 to 110, positive medial Mc[place], slight limp right, no distal edema, NV intact.  ROM of the right shoulder is full with pain in the extremes. ROM of the left hip is full with pain over the lateral trochanteric area, no redness.  Encounter Diagnoses  Name Primary?   Chronic pain of right knee Yes   Chronic right shoulder pain    Trochanteric bursitis, left hip    X-rays were done of the right knee, reported separately.  PROCEDURE NOTE:  The patient request injection, verbal consent was obtained.  The right shoulder was prepped appropriately after time out was performed.   Sterile technique was observed and injection of 1 cc of DepoMedrol '40mg'$  with several cc's of plain xylocaine. Anesthesia was provided by ethyl chloride and a 20-gauge needle was used to inject the shoulder area. A posterior approach was used.  The injection was tolerated well.  A band aid dressing was applied.  The patient was advised to apply ice later today and tomorrow to the injection sight as needed.   PROCEDURE NOTE:  The patient request injection, verbal consent was obtained.  The right trochanteric area of the hip was prepped appropriately after time out was performed.   Sterile technique was observed and injection of 1 cc of DepoMedrol 40 mg with several cc's of plain xylocaine. Anesthesia was provided by ethyl chloride and a 20-gauge needle was used to inject the hip area. The injection was tolerated well.  A band aid dressing was applied.  The patient was advised to apply ice later today and tomorrow to the injection sight as needed.  I will get MRI of the right knee.  I am concerned about  meniscus tear.  I will give valium 10 for the MRI and will refill her pain medicine.  Return in two weeks.  Call if any problem.  Precautions discussed.  Electronically Signed Sanjuana Kava, MD 11/28/20238:59 AM

## 2022-09-25 NOTE — Patient Instructions (Addendum)
YOU HAVE BEEN REFERRED TO HAVE AN MRI OF YOUR RIGHT KNEE. BECAUSE OF YOUR INSURANCE YOU HAVE TO HAVE YOURS DONE AT [facility] ON [address]  CALL AND MAKE YOUR APPOINTMENT AND WE WILL GET IT APPROVED.   [address] [place], [place] [address] Phone [phone]

## 2022-10-04 ENCOUNTER — Ambulatory Visit
Admission: RE | Admit: 2022-10-04 | Discharge: 2022-10-04 | Disposition: A | Discharge status: Home / Self Care | Payer: 59 | Source: Ambulatory Visit | Attending: Orthopaedic Surgery | Admitting: Orthopaedic Surgery

## 2022-10-04 DIAGNOSIS — M7121 Synovial cyst of popliteal space [Baker], right knee: Secondary | ICD-10-CM | POA: Diagnosis not present

## 2022-10-04 DIAGNOSIS — G8929 Other chronic pain: Secondary | ICD-10-CM

## 2022-10-09 ENCOUNTER — Ambulatory Visit: Payer: 59 | Admitting: Orthopaedic Surgery

## 2022-10-09 ENCOUNTER — Encounter: Payer: Self-pay | Admitting: Orthopaedic Surgery

## 2022-10-09 VITALS — BP 122/86 | HR 84 | Ht 64.0 in | Wt 163.0 lb

## 2022-10-09 DIAGNOSIS — G8929 Other chronic pain: Secondary | ICD-10-CM

## 2022-10-09 DIAGNOSIS — M25561 Pain in right knee: Secondary | ICD-10-CM

## 2022-10-09 MED ORDER — METHYLPREDNISOLONE ACETATE 40 MG/ML IJ SUSP
40.0000 mg | Freq: Once | INTRAMUSCULAR | Status: AC
  Administered 2022-10-09: 40 mg via INTRA_ARTICULAR

## 2022-10-09 NOTE — Progress Notes (Signed)
My right knee hurts.  She had MRI of the right knee showing: IMPRESSION: 1. No meniscal or ligamentous injury of the right knee. 2. Mild partial-thickness cartilage loss of the lateral patellar facet. 3. Mild partial-thickness cartilage loss of the medial femorotibial compartment. 4. Mild tendinosis of the proximal patellar tendon.  I have explained the findings to her.    I have independently reviewed the MRI.    Right knee has medial pain, ROM 0 to 105, medial pain, slight effusion, crepitus, limp right.  No distal edema.  Encounter Diagnosis  Name Primary?   Chronic pain of right knee Yes   PROCEDURE NOTE:  The patient requests injections of the right knee , verbal consent was obtained.  The right knee was prepped appropriately after time out was performed.   Sterile technique was observed and injection of 1 cc of DepoMedrol '40mg'$  with several cc's of plain xylocaine. Anesthesia was provided by ethyl chloride and a 20-gauge needle was used to inject the knee area. The injection was tolerated well.  A band aid dressing was applied.  The patient was advised to apply ice later today and tomorrow to the injection sight as needed.  Return on regularly scheduled visit.  Call if any problem.  Precautions discussed.  Electronically Signed Sanjuana Kava, MD 12/12/20238:36 AM

## 2022-10-09 NOTE — Addendum Note (Signed)
Addended by: Obie Dredge A on: 10/09/2022 08:54 AM   Modules accepted: Orders

## 2022-10-23 ENCOUNTER — Other Ambulatory Visit: Payer: Self-pay | Admitting: Orthopaedic Surgery

## 2022-10-24 ENCOUNTER — Telehealth: Payer: Self-pay | Admitting: Orthopaedic Surgery

## 2022-10-24 NOTE — Telephone Encounter (Signed)
Patient lvm stating she needs an injection in her hip that she can't hardly walk.  Per Abigail Butts, please review and advise, the patient just got an injection the end of November.  Pt's # [phone]

## 2022-10-24 NOTE — Telephone Encounter (Signed)
Per Dr.Harrison, patient needs to see Dr.Keeling for hip injection. She never had the MRI Lumbar Spine ordered in 2016.

## 2022-10-24 NOTE — Telephone Encounter (Signed)
Has she had the MRI ordered 12/16 ?

## 2022-10-30 ENCOUNTER — Ambulatory Visit (INDEPENDENT_AMBULATORY_CARE_PROVIDER_SITE_OTHER): Payer: Self-pay | Admitting: Orthopaedic Surgery

## 2022-10-30 ENCOUNTER — Encounter: Payer: Self-pay | Admitting: Orthopaedic Surgery

## 2022-10-30 DIAGNOSIS — M7062 Trochanteric bursitis, left hip: Secondary | ICD-10-CM

## 2022-10-30 MED ORDER — METHYLPREDNISOLONE ACETATE 40 MG/ML IJ SUSP
40.0000 mg | Freq: Once | INTRAMUSCULAR | Status: AC
  Administered 2022-10-30: 40 mg via INTRA_ARTICULAR

## 2022-10-30 NOTE — Progress Notes (Signed)
PROCEDURE NOTE:  The patient request injection, verbal consent was obtained.  The left trochanteric area of the hip was prepped appropriately after time out was performed.   Sterile technique was observed and injection of 1 cc of DepoMedrol 40 mg with several cc's of plain xylocaine. Anesthesia was provided by ethyl chloride and a 20-gauge needle was used to inject the hip area. The injection was tolerated well.  A band aid dressing was applied.  The patient was advised to apply ice later today and tomorrow to the injection sight as needed.  Encounter Diagnosis  Name Primary?   Trochanteric bursitis, left hip Yes   Return in one month.  Call if any problem.  Precautions discussed.  Electronically Signed Sanjuana Kava, MD 1/2/202411:04 AM

## 2022-11-06 ENCOUNTER — Telehealth: Payer: Self-pay | Admitting: Orthopedic Surgery

## 2022-11-06 ENCOUNTER — Other Ambulatory Visit: Payer: Self-pay

## 2022-11-06 ENCOUNTER — Telehealth: Payer: Self-pay | Admitting: Orthopaedic Surgery

## 2022-11-06 DIAGNOSIS — M7062 Trochanteric bursitis, left hip: Secondary | ICD-10-CM

## 2022-11-06 DIAGNOSIS — G8929 Other chronic pain: Secondary | ICD-10-CM

## 2022-11-06 DIAGNOSIS — G894 Chronic pain syndrome: Secondary | ICD-10-CM

## 2022-11-06 NOTE — Telephone Encounter (Signed)
Patient called, stated that something is wrong w/her back and she is requesting that Dr. Luna Glasgow send her for a MRI of her back and left hip.  Pt's # [phone]

## 2022-11-06 NOTE — Progress Notes (Unsigned)
Completed prior auth request via covermymeds.

## 2022-11-06 NOTE — Telephone Encounter (Signed)
Imaging ordered per provider. Patient will have to have imaging performed at GI due to insurance.

## 2022-11-07 ENCOUNTER — Ambulatory Visit (INDEPENDENT_AMBULATORY_CARE_PROVIDER_SITE_OTHER): Payer: Self-pay | Admitting: Orthopaedic Surgery

## 2022-11-07 ENCOUNTER — Telehealth: Payer: Self-pay

## 2022-11-07 ENCOUNTER — Encounter: Payer: Self-pay | Admitting: Orthopaedic Surgery

## 2022-11-07 DIAGNOSIS — M25511 Pain in right shoulder: Secondary | ICD-10-CM

## 2022-11-07 DIAGNOSIS — G8929 Other chronic pain: Secondary | ICD-10-CM

## 2022-11-07 DIAGNOSIS — G894 Chronic pain syndrome: Secondary | ICD-10-CM

## 2022-11-07 DIAGNOSIS — M5442 Lumbago with sciatica, left side: Secondary | ICD-10-CM

## 2022-11-07 MED ORDER — METHYLPREDNISOLONE ACETATE 40 MG/ML IJ SUSP
40.0000 mg | Freq: Once | INTRAMUSCULAR | Status: AC
  Administered 2022-11-07: 40 mg via INTRA_ARTICULAR

## 2022-11-07 MED ORDER — OXYCODONE-ACETAMINOPHEN 7.5-325 MG PO TABS: 1.0000 | ORAL_TABLET | Freq: Four times a day (QID) | ORAL | 0 refills | Status: AC | PRN

## 2022-11-07 NOTE — Addendum Note (Signed)
Addended by: Obie Dredge A on: 11/07/2022 10:48 AM   Modules accepted: Orders

## 2022-11-07 NOTE — Patient Instructions (Signed)
You will have your imaging done at [facility]. Call and schedule your appointment within one week. We will work on Therapist, occupational.   [facility] [address] [place] [address] PHONE: [phone]

## 2022-11-07 NOTE — Progress Notes (Signed)
PROCEDURE NOTE:  The patient request injection, verbal consent was obtained.  The right shoulder was prepped appropriately after time out was performed.   Sterile technique was observed and injection of 1 cc of DepoMedrol '40mg'$  with several cc's of plain xylocaine. Anesthesia was provided by ethyl chloride and a 20-gauge needle was used to inject the shoulder area. A posterior approach was used.  The injection was tolerated well.  A band aid dressing was applied.  The patient was advised to apply ice later today and tomorrow to the injection sight as needed.  She has more pain of the lumbar spine.  I will get MRI.  We need insurance approval first.  Encounter Diagnoses  Name Primary?   Chronic right shoulder pain Yes   Chronic left-sided low back pain with left-sided sciatica    Chronic pain syndrome    Return in two weeks.  I have reviewed the [place] web site prior to prescribing narcotic medicine for this patient.  Call if any problem.  Precautions discussed.  Electronically Signed Sanjuana Kava, MD 1/10/20249:10 AM

## 2022-11-07 NOTE — Progress Notes (Unsigned)
Received prior auth request for patients pain medication. Prior auth submitted via covermymeds. Waiting on response from insurance.

## 2022-11-08 ENCOUNTER — Telehealth: Payer: Self-pay

## 2022-11-08 ENCOUNTER — Ambulatory Visit: Payer: 59 | Admitting: Orthopaedic Surgery

## 2022-11-08 MED ORDER — PREDNISONE 5 MG (21) PO TBPK: ORAL_TABLET | ORAL | 0 refills | Status: DC

## 2022-11-08 NOTE — Telephone Encounter (Signed)
Spoke with patient and advised her Dr.Keeling has sent the prescription for prednisone in to her pharmacy. She will schedule her MRI at GI for around 12/05/22 to allow time for it to be active.

## 2022-11-08 NOTE — Telephone Encounter (Signed)
Judith Rodriguez called stating that there was a mix up with her insurance and it is straightened out, but they will retro her coverage to cover for January and her insurance will be in effective on 11/29/22, so she will wait until it comes in effective before getting MRI. Any questions please call her at [phone].  This is the message for 11/07/22

## 2022-11-21 ENCOUNTER — Ambulatory Visit: Payer: 59 | Admitting: Orthopaedic Surgery

## 2022-11-27 ENCOUNTER — Encounter: Payer: Self-pay | Admitting: Orthopaedic Surgery

## 2022-11-27 ENCOUNTER — Ambulatory Visit (INDEPENDENT_AMBULATORY_CARE_PROVIDER_SITE_OTHER): Payer: 59 | Admitting: Orthopaedic Surgery

## 2022-11-27 DIAGNOSIS — M25511 Pain in right shoulder: Secondary | ICD-10-CM

## 2022-11-27 DIAGNOSIS — M7062 Trochanteric bursitis, left hip: Secondary | ICD-10-CM

## 2022-11-27 DIAGNOSIS — G8929 Other chronic pain: Secondary | ICD-10-CM

## 2022-11-27 MED ORDER — METHYLPREDNISOLONE ACETATE 40 MG/ML IJ SUSP
40.0000 mg | Freq: Once | INTRAMUSCULAR | Status: AC
  Administered 2022-11-27: 40 mg via INTRA_ARTICULAR

## 2022-11-27 NOTE — Progress Notes (Signed)
PROCEDURE NOTE:  The patient request injection, verbal consent was obtained.  The right trochanteric area of the hip was prepped appropriately after time out was performed.   Sterile technique was observed and injection of 1 cc of DepoMedrol 40 mg with several cc's of plain xylocaine. Anesthesia was provided by ethyl chloride and a 20-gauge needle was used to inject the hip area. The injection was tolerated well.  A band aid dressing was applied.  The patient was advised to apply ice later today and tomorrow to the injection sight as needed.  PROCEDURE NOTE:  The patient request injection, verbal consent was obtained.  The right shoulder was prepped appropriately after time out was performed.   Sterile technique was observed and injection of 1 cc of DepoMedrol '40mg'$  with several cc's of plain xylocaine. Anesthesia was provided by ethyl chloride and a 20-gauge needle was used to inject the shoulder area. A posterior approach was used.  The injection was tolerated well.  A band aid dressing was applied.  The patient was advised to apply ice later today and tomorrow to the injection sight as needed.  Encounter Diagnoses  Name Primary?   Chronic right shoulder pain Yes   Trochanteric bursitis, left hip    Return in one month.   Call if any problem.  Precautions discussed.  Electronically Signed Sanjuana Kava, MD 1/30/202410:31 AM

## 2022-11-29 ENCOUNTER — Telehealth: Payer: Self-pay

## 2022-11-29 NOTE — Telephone Encounter (Signed)
Judith Rodriguez wants to know if her hip will show up in her MRI, because she thinks it is her hip. Also her MRI appt is on 12/08/22 @ 6:50 am. She will need for Dr. Luna Glasgow to send in a Valium for her on 12/06/22 to her pharmacy.

## 2022-11-29 NOTE — Telephone Encounter (Signed)
Judith Rodriguez called with information about her insurance is going to retro back to January to pay for her bill. She gave me a Reference # [id number] that was for this.

## 2022-12-03 ENCOUNTER — Telehealth: Payer: Self-pay | Admitting: Orthopaedic Surgery

## 2022-12-08 ENCOUNTER — Ambulatory Visit
Admission: RE | Admit: 2022-12-08 | Discharge: 2022-12-08 | Disposition: A | Discharge status: Home / Self Care | Payer: Self-pay | Source: Ambulatory Visit | Attending: Orthopaedic Surgery | Admitting: Orthopaedic Surgery

## 2022-12-08 DIAGNOSIS — G8929 Other chronic pain: Secondary | ICD-10-CM

## 2022-12-08 DIAGNOSIS — M7062 Trochanteric bursitis, left hip: Secondary | ICD-10-CM

## 2022-12-08 DIAGNOSIS — G894 Chronic pain syndrome: Secondary | ICD-10-CM

## 2022-12-11 ENCOUNTER — Telehealth: Payer: Self-pay | Admitting: Orthopaedic Surgery

## 2022-12-13 ENCOUNTER — Ambulatory Visit (INDEPENDENT_AMBULATORY_CARE_PROVIDER_SITE_OTHER): Payer: 59 | Admitting: Orthopaedic Surgery

## 2022-12-13 ENCOUNTER — Encounter: Payer: Self-pay | Admitting: Orthopaedic Surgery

## 2022-12-13 VITALS — Wt 151.0 lb

## 2022-12-13 DIAGNOSIS — M5442 Lumbago with sciatica, left side: Secondary | ICD-10-CM

## 2022-12-13 DIAGNOSIS — G894 Chronic pain syndrome: Secondary | ICD-10-CM

## 2022-12-13 DIAGNOSIS — G8929 Other chronic pain: Secondary | ICD-10-CM

## 2022-12-13 NOTE — Progress Notes (Signed)
My back still hurts.  She had MRI of the lumbar spine which showed: IMPRESSION: 1. Mild lumbar spine spondylosis as described above. 2. No acute osseous injury of the lumbar spine.  I have explained the finding to her.    I have independently reviewed the MRI.    She has lost about 17 pounds since Christmas.  She is not trying to lose weight.  I have asked her to see her primary care about this.  She will.  Spine/Pelvis examination:  Inspection:  Overall, sacoiliac joint benign and hips nontender; without crepitus or defects.   Thoracic spine inspection: Alignment normal without kyphosis present   Lumbar spine inspection:  Alignment  with normal lumbar lordosis, without scoliosis apparent.   Thoracic spine palpation:  without tenderness of spinal processes   Lumbar spine palpation: without tenderness of lumbar area; without tightness of lumbar muscles    Range of Motion:   Lumbar flexion, forward flexion is normal without pain or tenderness    Lumbar extension is full without pain or tenderness   Left lateral bend is normal without pain or tenderness   Right lateral bend is normal without pain or tenderness   Straight leg raising is normal  Strength & tone: normal   Stability overall normal stability  Encounter Diagnoses  Name Primary?   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  Call if any problem.  Precautions discussed.  Electronically Signed Sanjuana Kava, MD 2/15/20249:08 AM

## 2022-12-25 ENCOUNTER — Encounter: Payer: Self-pay | Admitting: Orthopaedic Surgery

## 2022-12-25 ENCOUNTER — Ambulatory Visit (INDEPENDENT_AMBULATORY_CARE_PROVIDER_SITE_OTHER): Payer: 59 | Admitting: Orthopaedic Surgery

## 2022-12-25 DIAGNOSIS — G8929 Other chronic pain: Secondary | ICD-10-CM | POA: Diagnosis not present

## 2022-12-25 DIAGNOSIS — G894 Chronic pain syndrome: Secondary | ICD-10-CM

## 2022-12-25 DIAGNOSIS — M25511 Pain in right shoulder: Secondary | ICD-10-CM

## 2022-12-25 DIAGNOSIS — M7062 Trochanteric bursitis, left hip: Secondary | ICD-10-CM

## 2022-12-25 NOTE — Progress Notes (Signed)
PROCEDURE NOTE:  The patient request injection, verbal consent was obtained.  The left trochanteric area of the hip was prepped appropriately after time out was performed.   Sterile technique was observed and injection of 1 cc of DepoMedrol 40 mg with several cc's of plain xylocaine. Anesthesia was provided by ethyl chloride and a 20-gauge needle was used to inject the hip area. The injection was tolerated well.  A band aid dressing was applied.  The patient was advised to apply ice later today and tomorrow to the injection sight as needed.  PROCEDURE NOTE:  The patient request injection, verbal consent was obtained.  The right shoulder was prepped appropriately after time out was performed.   Sterile technique was observed and injection of 1 cc of DepoMedrol '40mg'$  with several cc's of plain xylocaine. Anesthesia was provided by ethyl chloride and a 20-gauge needle was used to inject the shoulder area. A posterior approach was used.  The injection was tolerated well.  A band aid dressing was applied.  The patient was advised to apply ice later today and tomorrow to the injection sight as needed.  Encounter Diagnoses  Name Primary?   Chronic right shoulder pain Yes   Trochanteric bursitis, left hip    Chronic pain syndrome    Return in one month.  Call if any problem.  Precautions discussed.  Electronically Signed Sanjuana Kava, MD 2/27/20249:30 AM

## 2022-12-27 ENCOUNTER — Encounter: Payer: Self-pay | Admitting: Radiology

## 2023-01-09 ENCOUNTER — Telehealth: Payer: Self-pay | Admitting: Orthopaedic Surgery

## 2023-01-23 ENCOUNTER — Encounter: Payer: Self-pay | Admitting: Orthopaedic Surgery

## 2023-01-23 ENCOUNTER — Ambulatory Visit (INDEPENDENT_AMBULATORY_CARE_PROVIDER_SITE_OTHER): Payer: 59 | Admitting: Orthopaedic Surgery

## 2023-01-23 DIAGNOSIS — G894 Chronic pain syndrome: Secondary | ICD-10-CM | POA: Diagnosis not present

## 2023-01-23 DIAGNOSIS — M7062 Trochanteric bursitis, left hip: Secondary | ICD-10-CM | POA: Diagnosis not present

## 2023-01-23 DIAGNOSIS — M25561 Pain in right knee: Secondary | ICD-10-CM

## 2023-01-23 DIAGNOSIS — G8929 Other chronic pain: Secondary | ICD-10-CM | POA: Diagnosis not present

## 2023-01-23 MED ORDER — METHYLPREDNISOLONE ACETATE 40 MG/ML IJ SUSP
40.0000 mg | Freq: Once | INTRAMUSCULAR | Status: AC
  Administered 2023-01-23: 40 mg via INTRA_ARTICULAR

## 2023-01-23 NOTE — Addendum Note (Signed)
Addended by: Obie Dredge A on: 01/23/2023 09:13 AM   Modules accepted: Orders

## 2023-01-23 NOTE — Progress Notes (Signed)
PROCEDURE NOTE:  The patient request injection, verbal consent was obtained.  The left trochanteric area of the hip was prepped appropriately after time out was performed.   Sterile technique was observed and injection of 1 cc of DepoMedrol 40 mg with several cc's of plain xylocaine. Anesthesia was provided by ethyl chloride and a 20-gauge needle was used to inject the hip area. The injection was tolerated well.  A band aid dressing was applied.  The patient was advised to apply ice later today and tomorrow to the injection sight as needed.  PROCEDURE NOTE:  The patient requests injections of the right knee , verbal consent was obtained.  The right knee was prepped appropriately after time out was performed.   Sterile technique was observed and injection of 1 cc of DepoMedrol 40mg  with several cc's of plain xylocaine. Anesthesia was provided by ethyl chloride and a 20-gauge needle was used to inject the knee area. The injection was tolerated well.  A band aid dressing was applied.  The patient was advised to apply ice later today and tomorrow to the injection sight as needed.  Encounter Diagnoses  Name Primary?   Trochanteric bursitis, left hip Yes   Chronic pain syndrome    Chronic pain of right knee    I have given Rx for Lidoderm 5% patches  Return in one month.  Call if any problem.  Precautions discussed.  Electronically Signed Sanjuana Kava, MD 3/27/20249:06 AM

## 2023-01-24 ENCOUNTER — Ambulatory Visit: Payer: 59 | Admitting: Orthopaedic Surgery

## 2023-02-07 ENCOUNTER — Telehealth: Payer: Self-pay

## 2023-02-07 MED ORDER — OXYCODONE-ACETAMINOPHEN 7.5-325 MG PO TABS: ORAL_TABLET | ORAL | 0 refills | Status: DC

## 2023-02-07 NOTE — Telephone Encounter (Signed)
Oxycodone-Acetaminophen 7.5/325 MG  Qty 120 Tablets  PATIENT USES [facility]

## 2023-02-20 ENCOUNTER — Encounter: Payer: Self-pay | Admitting: Orthopaedic Surgery

## 2023-02-20 ENCOUNTER — Ambulatory Visit: Payer: 59 | Admitting: Orthopaedic Surgery

## 2023-02-20 DIAGNOSIS — G894 Chronic pain syndrome: Secondary | ICD-10-CM | POA: Diagnosis not present

## 2023-02-20 DIAGNOSIS — M7062 Trochanteric bursitis, left hip: Secondary | ICD-10-CM

## 2023-02-20 DIAGNOSIS — M25511 Pain in right shoulder: Secondary | ICD-10-CM

## 2023-02-20 DIAGNOSIS — G8929 Other chronic pain: Secondary | ICD-10-CM

## 2023-02-20 MED ORDER — METHYLPREDNISOLONE ACETATE 40 MG/ML IJ SUSP
40.0000 mg | Freq: Once | INTRAMUSCULAR | Status: AC
  Administered 2023-02-20: 40 mg via INTRA_ARTICULAR

## 2023-02-20 NOTE — Progress Notes (Signed)
PROCEDURE NOTE:  The patient request injection, verbal consent was obtained.  The right shoulder was prepped appropriately after time out was performed.   Sterile technique was observed and injection of 1 cc of DepoMedrol  with several cc's of plain xylocaine. Anesthesia was provided by ethyl chloride and a 20-gauge needle was used to inject the shoulder area. A posterior approach was used.  The injection was tolerated well.  A band aid dressing was applied.  The patient was advised to apply ice later today and tomorrow to the injection sight as needed.  PROCEDURE NOTE:  The patient request injection, verbal consent was obtained.  The left trochanteric area of the hip was prepped appropriately after time out was performed.   Sterile technique was observed and injection of 1 cc of DepoMedrol 40 mg with several cc's of plain xylocaine. Anesthesia was provided by ethyl chloride and a 20-gauge needle was used to inject the hip area. The injection was tolerated well.  A band aid dressing was applied.  The patient was advised to apply ice later today and tomorrow to the injection sight as needed.  Encounter Diagnoses  Name Primary?   Trochanteric bursitis, left hip    Chronic pain syndrome    Chronic right shoulder pain    Return in one month.  Call if any problem.  Precautions discussed.  Electronically Signed Darreld Mclean, MD 4/24/20249:42 AM

## 2023-02-26 ENCOUNTER — Ambulatory Visit: Payer: 59 | Admitting: Orthopaedic Surgery

## 2023-03-05 ENCOUNTER — Telehealth: Payer: Self-pay

## 2023-03-05 NOTE — Telephone Encounter (Signed)
Oxycodone-Acetaminophen 7.5/325 MG  Qty 120 Tablets  PATIENT USES [facility] IN [place]

## 2023-03-06 MED ORDER — OXYCODONE-ACETAMINOPHEN 7.5-325 MG PO TABS: ORAL_TABLET | ORAL | 0 refills | Status: DC

## 2023-03-20 ENCOUNTER — Ambulatory Visit: Payer: 59 | Admitting: Orthopaedic Surgery

## 2023-03-20 ENCOUNTER — Encounter: Payer: Self-pay | Admitting: Orthopaedic Surgery

## 2023-03-20 DIAGNOSIS — M25511 Pain in right shoulder: Secondary | ICD-10-CM | POA: Diagnosis not present

## 2023-03-20 DIAGNOSIS — M7062 Trochanteric bursitis, left hip: Secondary | ICD-10-CM | POA: Diagnosis not present

## 2023-03-20 DIAGNOSIS — G894 Chronic pain syndrome: Secondary | ICD-10-CM | POA: Diagnosis not present

## 2023-03-20 DIAGNOSIS — G8929 Other chronic pain: Secondary | ICD-10-CM

## 2023-03-20 MED ORDER — METHYLPREDNISOLONE ACETATE 40 MG/ML IJ SUSP: 40.0000 mg | Freq: Once | INTRAMUSCULAR | Status: DC

## 2023-03-20 MED ORDER — METHYLPREDNISOLONE ACETATE 40 MG/ML IJ SUSP
40.0000 mg | Freq: Once | INTRAMUSCULAR | Status: AC
  Administered 2023-03-20: 40 mg via INTRA_ARTICULAR

## 2023-03-20 NOTE — Progress Notes (Signed)
I hurt all over.  She has had more pain of the back and other joints.  She has no trauma.  She is not working. She is taking her medicine.  Prednisone helps but only the few days she is on it.  I have told her about a month of a decreasing dose.  She will consider.  PROCEDURE NOTE:  The patient request injection, verbal consent was obtained.  The left trochanteric area of the hip was prepped appropriately after time out was performed.   Sterile technique was observed and injection of 1 cc of DepoMedrol 40 mg with several cc's of plain xylocaine. Anesthesia was provided by ethyl chloride and a 20-gauge needle was used to inject the hip area. The injection was tolerated well.  A band aid dressing was applied.  The patient was advised to apply ice later today and tomorrow to the injection sight as needed.  PROCEDURE NOTE:  The patient request injection, verbal consent was obtained.  The right shoulder was prepped appropriately after time out was performed.   Sterile technique was observed and injection of 1 cc of DepoMedrol 40mg  with several cc's of plain xylocaine. Anesthesia was provided by ethyl chloride and a 20-gauge needle was used to inject the shoulder area. A posterior approach was used.  The injection was tolerated well.  A band aid dressing was applied.  The patient was advised to apply ice later today and tomorrow to the injection sight as needed.  Encounter Diagnoses  Name Primary?   Trochanteric bursitis, left hip Yes   Chronic right shoulder pain    Chronic pain syndrome    Return in two weeks.  Call if any problem.  Precautions discussed.  Electronically Signed Darreld Mclean, MD 5/22/20249:30 AM

## 2023-04-03 ENCOUNTER — Telehealth: Payer: Self-pay

## 2023-04-03 ENCOUNTER — Ambulatory Visit: Payer: 59 | Admitting: Orthopaedic Surgery

## 2023-04-03 MED ORDER — PREDNISONE 10 MG (21) PO TBPK: ORAL_TABLET | ORAL | 1 refills | Status: DC

## 2023-04-03 MED ORDER — OXYCODONE-ACETAMINOPHEN 7.5-325 MG PO TABS: ORAL_TABLET | ORAL | 0 refills | Status: DC

## 2023-04-03 NOTE — Telephone Encounter (Signed)
Judith Rodriguez called stating that Dr. Hilda Lias was suppose to start her on a higher dosage of Prednisone.  USES [facility] IN [place]

## 2023-04-03 NOTE — Telephone Encounter (Signed)
Oxycodone-Acetaminophen 7.5/325 MG  Qty 120 Tablets  PATIENT USES [facility] IN [place] 

## 2023-04-16 ENCOUNTER — Ambulatory Visit: Payer: 59 | Admitting: Orthopaedic Surgery

## 2023-04-30 ENCOUNTER — Ambulatory Visit (INDEPENDENT_AMBULATORY_CARE_PROVIDER_SITE_OTHER): Payer: 59 | Admitting: Orthopaedic Surgery

## 2023-04-30 ENCOUNTER — Encounter: Payer: Self-pay | Admitting: Orthopaedic Surgery

## 2023-04-30 DIAGNOSIS — M7062 Trochanteric bursitis, left hip: Secondary | ICD-10-CM

## 2023-04-30 DIAGNOSIS — G8929 Other chronic pain: Secondary | ICD-10-CM

## 2023-04-30 DIAGNOSIS — G894 Chronic pain syndrome: Secondary | ICD-10-CM

## 2023-04-30 DIAGNOSIS — M25511 Pain in right shoulder: Secondary | ICD-10-CM

## 2023-04-30 MED ORDER — OXYCODONE-ACETAMINOPHEN 7.5-325 MG PO TABS: ORAL_TABLET | ORAL | 0 refills | Status: DC

## 2023-04-30 NOTE — Progress Notes (Addendum)
PROCEDURE NOTE:  The patient request injection, verbal consent was obtained.  The right shoulder was prepped appropriately after time out was performed.   Sterile technique was observed and injection of 1 cc of DepoMedrol 40mg  with several cc's of plain xylocaine. Anesthesia was provided by ethyl chloride and a 20-gauge needle was used to inject the shoulder area. A posterior approach was used.  The injection was tolerated well.  A band aid dressing was applied.  The patient was advised to apply ice later today and tomorrow to the injection sight as needed.   PROCEDURE NOTE:  The patient request injection, verbal consent was obtained.  The left trochanteric area of the hip was prepped appropriately after time out was performed.   Sterile technique was observed and injection of 1 cc of DepoMedrol 40 mg with several cc's of plain xylocaine. Anesthesia was provided by ethyl chloride and a 20-gauge needle was used to inject the hip area. The injection was tolerated well.  A band aid dressing was applied.  The patient was advised to apply ice later today and tomorrow to the injection sight as needed.  Encounter Diagnoses  Name Primary?   Trochanteric bursitis, left hip Yes   Chronic right shoulder pain    Chronic pain syndrome    I have reviewed the [place] Controlled Substance Reporting System web site prior to prescribing narcotic medicine for this patient.  Return in one month.  Call if any problem.  Precautions discussed.  Electronically Signed Darreld Mclean, MD 7/2/20248:22 AM

## 2023-05-10 DIAGNOSIS — F17200 Nicotine dependence, unspecified, uncomplicated: Secondary | ICD-10-CM | POA: Diagnosis not present

## 2023-05-10 DIAGNOSIS — J320 Chronic maxillary sinusitis: Secondary | ICD-10-CM | POA: Diagnosis not present

## 2023-05-10 DIAGNOSIS — H6690 Otitis media, unspecified, unspecified ear: Secondary | ICD-10-CM | POA: Diagnosis not present

## 2023-05-10 DIAGNOSIS — R030 Elevated blood-pressure reading, without diagnosis of hypertension: Secondary | ICD-10-CM | POA: Diagnosis not present

## 2023-05-10 DIAGNOSIS — Z6825 Body mass index (BMI) 25.0-25.9, adult: Secondary | ICD-10-CM | POA: Diagnosis not present

## 2023-05-10 DIAGNOSIS — F411 Generalized anxiety disorder: Secondary | ICD-10-CM | POA: Diagnosis not present

## 2023-05-10 DIAGNOSIS — F32A Depression, unspecified: Secondary | ICD-10-CM | POA: Diagnosis not present

## 2023-05-28 ENCOUNTER — Ambulatory Visit (INDEPENDENT_AMBULATORY_CARE_PROVIDER_SITE_OTHER): Payer: 59 | Admitting: Orthopaedic Surgery

## 2023-05-28 ENCOUNTER — Encounter: Payer: Self-pay | Admitting: Orthopaedic Surgery

## 2023-05-28 DIAGNOSIS — G8929 Other chronic pain: Secondary | ICD-10-CM

## 2023-05-28 DIAGNOSIS — M7062 Trochanteric bursitis, left hip: Secondary | ICD-10-CM | POA: Diagnosis not present

## 2023-05-28 DIAGNOSIS — M25511 Pain in right shoulder: Secondary | ICD-10-CM | POA: Diagnosis not present

## 2023-05-28 DIAGNOSIS — G894 Chronic pain syndrome: Secondary | ICD-10-CM

## 2023-05-28 MED ORDER — METHYLPREDNISOLONE ACETATE 40 MG/ML IJ SUSP
40.0000 mg | Freq: Once | INTRAMUSCULAR | Status: AC
  Administered 2023-05-28: 40 mg via INTRA_ARTICULAR

## 2023-05-28 MED ORDER — OXYCODONE-ACETAMINOPHEN 7.5-325 MG PO TABS: ORAL_TABLET | ORAL | 0 refills | Status: DC

## 2023-05-28 NOTE — Progress Notes (Signed)
PROCEDURE NOTE:  The patient request injection, verbal consent was obtained.  The right shoulder was prepped appropriately after time out was performed.   Sterile technique was observed and injection of 1 cc of DepoMedrol 40mg  with several cc's of plain xylocaine. Anesthesia was provided by ethyl chloride and a 20-gauge needle was used to inject the shoulder area. A posterior approach was used.  The injection was tolerated well.  A band aid dressing was applied.  The patient was advised to apply ice later today and tomorrow to the injection sight as needed.   PROCEDURE NOTE:  The patient request injection, verbal consent was obtained.  The left trochanteric area of the hip was prepped appropriately after time out was performed.   Sterile technique was observed and injection of 1 cc of DepoMedrol 40 mg with several cc's of plain xylocaine. Anesthesia was provided by ethyl chloride and a 20-gauge needle was used to inject the hip area. The injection was tolerated well.  A band aid dressing was applied.  The patient was advised to apply ice later today and tomorrow to the injection sight as needed.  Encounter Diagnoses  Name Primary?   Trochanteric bursitis, left hip Yes   Chronic right shoulder pain    Chronic pain syndrome    I have reviewed the [place] Controlled Substance Reporting System web site prior to prescribing narcotic medicine for this patient.  Return in five weeks.  Call if any problem.  Precautions discussed.    Electronically Signed Darreld Mclean, MD 7/30/20248:18 AM

## 2023-06-15 ENCOUNTER — Other Ambulatory Visit: Payer: Self-pay | Admitting: Orthopaedic Surgery

## 2023-06-25 ENCOUNTER — Telehealth: Payer: Self-pay | Admitting: Orthopaedic Surgery

## 2023-07-02 ENCOUNTER — Ambulatory Visit (INDEPENDENT_AMBULATORY_CARE_PROVIDER_SITE_OTHER): Payer: 59 | Admitting: Orthopaedic Surgery

## 2023-07-02 ENCOUNTER — Encounter: Payer: Self-pay | Admitting: Orthopaedic Surgery

## 2023-07-02 DIAGNOSIS — M7062 Trochanteric bursitis, left hip: Secondary | ICD-10-CM | POA: Diagnosis not present

## 2023-07-02 DIAGNOSIS — G8929 Other chronic pain: Secondary | ICD-10-CM | POA: Diagnosis not present

## 2023-07-02 DIAGNOSIS — M25511 Pain in right shoulder: Secondary | ICD-10-CM | POA: Diagnosis not present

## 2023-07-02 DIAGNOSIS — G894 Chronic pain syndrome: Secondary | ICD-10-CM

## 2023-07-02 NOTE — Progress Notes (Signed)
PROCEDURE NOTE:  The patient request injection, verbal consent was obtained.  The right shoulder was prepped appropriately after time out was performed.   Sterile technique was observed and injection of 1 cc of DepoMedrol 40mg  with several cc's of plain xylocaine. Anesthesia was provided by ethyl chloride and a 20-gauge needle was used to inject the shoulder area. A posterior approach was used.  The injection was tolerated well.  A band aid dressing was applied.  The patient was advised to apply ice later today and tomorrow to the injection sight as needed.  PROCEDURE NOTE:  The patient request injection, verbal consent was obtained.  The left trochanteric area of the hip was prepped appropriately after time out was performed.   Sterile technique was observed and injection of 1 cc of DepoMedrol 40 mg with several cc's of plain xylocaine. Anesthesia was provided by ethyl chloride and a 20-gauge needle was used to inject the hip area. The injection was tolerated well.  A band aid dressing was applied.  The patient was advised to apply ice later today and tomorrow to the injection sight as needed.  Encounter Diagnoses  Name Primary?   Trochanteric bursitis, left hip Yes   Chronic right shoulder pain    Chronic pain syndrome    Return in one month.  Call if any problem.  Precautions discussed.  Electronically Signed Darreld Mclean, MD 9/3/20248:37 AM

## 2023-07-23 ENCOUNTER — Telehealth: Payer: Self-pay

## 2023-07-23 MED ORDER — OXYCODONE-ACETAMINOPHEN 7.5-325 MG PO TABS: ORAL_TABLET | ORAL | 0 refills | Status: DC

## 2023-07-23 NOTE — Telephone Encounter (Signed)
Oxycodone-Acetaminophen 7.5/325 MG  Qty 120 Tablets  PATIENT USES [facility]

## 2023-07-30 ENCOUNTER — Encounter: Payer: Self-pay | Admitting: Orthopaedic Surgery

## 2023-07-30 ENCOUNTER — Ambulatory Visit (INDEPENDENT_AMBULATORY_CARE_PROVIDER_SITE_OTHER): Payer: 59 | Admitting: Orthopaedic Surgery

## 2023-07-30 DIAGNOSIS — M25511 Pain in right shoulder: Secondary | ICD-10-CM | POA: Diagnosis not present

## 2023-07-30 DIAGNOSIS — M7062 Trochanteric bursitis, left hip: Secondary | ICD-10-CM

## 2023-07-30 DIAGNOSIS — G894 Chronic pain syndrome: Secondary | ICD-10-CM

## 2023-07-30 DIAGNOSIS — G8929 Other chronic pain: Secondary | ICD-10-CM

## 2023-07-30 MED ORDER — METHYLPREDNISOLONE ACETATE 40 MG/ML IJ SUSP
40.0000 mg | Freq: Once | INTRAMUSCULAR | Status: AC
  Administered 2023-07-30: 40 mg via INTRA_ARTICULAR

## 2023-07-30 NOTE — Progress Notes (Signed)
PROCEDURE NOTE:  The patient request injection, verbal consent was obtained.  The right shoulder was prepped appropriately after time out was performed.   Sterile technique was observed and injection of 1 cc of DepoMedrol 40mg  with several cc's of plain xylocaine. Anesthesia was provided by ethyl chloride and a 20-gauge needle was used to inject the shoulder area. A posterior approach was used.  The injection was tolerated well.  A band aid dressing was applied.  The patient was advised to apply ice later today and tomorrow to the injection sight as needed.   PROCEDURE NOTE:  The patient request injection, verbal consent was obtained.  The left trochanteric area of the hip was prepped appropriately after time out was performed.   Sterile technique was observed and injection of 1 cc of DepoMedrol 40 mg with several cc's of plain xylocaine. Anesthesia was provided by ethyl chloride and a 20-gauge needle was used to inject the hip area. The injection was tolerated well.  A band aid dressing was applied.  The patient was advised to apply ice later today and tomorrow to the injection sight as needed.  Encounter Diagnoses  Name Primary?   Trochanteric bursitis, left hip Yes   Chronic right shoulder pain    Chronic pain syndrome    Return in one month.  Call if any problem.  Precautions discussed.  Electronically Signed Darreld Mclean, MD 10/1/20248:18 AM

## 2023-08-20 ENCOUNTER — Telehealth: Payer: Self-pay

## 2023-08-20 MED ORDER — OXYCODONE-ACETAMINOPHEN 10-325 MG PO TABS: 1.0000 | ORAL_TABLET | Freq: Four times a day (QID) | ORAL | 0 refills | Status: DC | PRN

## 2023-08-20 NOTE — Telephone Encounter (Signed)
Oxycodone-Acetaminophen 10/325 MG  Qty  PATIENT USES [facility] IN [place]

## 2023-08-27 ENCOUNTER — Encounter: Payer: Self-pay | Admitting: Orthopaedic Surgery

## 2023-08-27 ENCOUNTER — Ambulatory Visit (INDEPENDENT_AMBULATORY_CARE_PROVIDER_SITE_OTHER): Payer: 59 | Admitting: Orthopaedic Surgery

## 2023-08-27 DIAGNOSIS — M7062 Trochanteric bursitis, left hip: Secondary | ICD-10-CM | POA: Diagnosis not present

## 2023-08-27 DIAGNOSIS — G894 Chronic pain syndrome: Secondary | ICD-10-CM

## 2023-08-27 DIAGNOSIS — M25511 Pain in right shoulder: Secondary | ICD-10-CM | POA: Diagnosis not present

## 2023-08-27 DIAGNOSIS — G8929 Other chronic pain: Secondary | ICD-10-CM

## 2023-08-27 MED ORDER — METHYLPREDNISOLONE ACETATE 40 MG/ML IJ SUSP
40.0000 mg | Freq: Once | INTRAMUSCULAR | Status: AC
  Administered 2023-08-27: 40 mg via INTRA_ARTICULAR

## 2023-08-27 NOTE — Progress Notes (Signed)
PROCEDURE NOTE:  The patient request injection, verbal consent was obtained.  The left trochanteric area of the hip was prepped appropriately after time out was performed.   Sterile technique was observed and injection of 1 cc of DepoMedrol 40 mg with several cc's of plain xylocaine. Anesthesia was provided by ethyl chloride and a 20-gauge needle was used to inject the hip area. The injection was tolerated well.  A band aid dressing was applied.  The patient was advised to apply ice later today and tomorrow to the injection sight as needed.  PROCEDURE NOTE:  The patient request injection, verbal consent was obtained.  The right shoulder was prepped appropriately after time out was performed.   Sterile technique was observed and injection of 1 cc of DepoMedrol 40mg  with several cc's of plain xylocaine. Anesthesia was provided by ethyl chloride and a 20-gauge needle was used to inject the shoulder area. A posterior approach was used.  The injection was tolerated well.  A band aid dressing was applied.  The patient was advised to apply ice later today and tomorrow to the injection sight as needed.  Encounter Diagnoses  Name Primary?   Trochanteric bursitis, left hip Yes   Chronic right shoulder pain    Chronic pain syndrome    Return in one month.  Call if any problem.  Precautions discussed.  Electronically Signed Darreld Mclean, MD 10/29/20249:57 AM

## 2023-09-17 ENCOUNTER — Telehealth: Payer: Self-pay

## 2023-09-17 MED ORDER — OXYCODONE-ACETAMINOPHEN 10-325 MG PO TABS: 1.0000 | ORAL_TABLET | Freq: Four times a day (QID) | ORAL | 0 refills | Status: DC | PRN

## 2023-09-17 NOTE — Telephone Encounter (Signed)
Oxycodone-Acetaminophen 10/325 MG  Qty 120 Tablets  PATIENT USES [facility] IN [place]

## 2023-09-25 ENCOUNTER — Ambulatory Visit (INDEPENDENT_AMBULATORY_CARE_PROVIDER_SITE_OTHER): Payer: 59 | Admitting: Orthopaedic Surgery

## 2023-09-25 ENCOUNTER — Encounter: Payer: Self-pay | Admitting: Orthopaedic Surgery

## 2023-09-25 DIAGNOSIS — M25561 Pain in right knee: Secondary | ICD-10-CM

## 2023-09-25 DIAGNOSIS — G8929 Other chronic pain: Secondary | ICD-10-CM | POA: Diagnosis not present

## 2023-09-25 DIAGNOSIS — M7062 Trochanteric bursitis, left hip: Secondary | ICD-10-CM

## 2023-09-25 MED ORDER — METHYLPREDNISOLONE ACETATE 40 MG/ML IJ SUSP
40.0000 mg | Freq: Once | INTRAMUSCULAR | Status: AC
  Administered 2023-09-25: 40 mg via INTRA_ARTICULAR

## 2023-09-25 NOTE — Addendum Note (Signed)
Addended by: Michaele Offer on: 09/25/2023 10:32 AM   Modules accepted: Orders

## 2023-09-25 NOTE — Progress Notes (Signed)
PROCEDURE NOTE:  The patient request injection, verbal consent was obtained.  The left trochanteric area of the hip was prepped appropriately after time out was performed.   Sterile technique was observed and injection of 1 cc of DepoMedrol 40 mg with several cc's of plain xylocaine. Anesthesia was provided by ethyl chloride and a 20-gauge needle was used to inject the hip area. The injection was tolerated well.  A band aid dressing was applied.  The patient was advised to apply ice later today and tomorrow to the injection sight as needed.  PROCEDURE NOTE:  The patient requests injections of the right knee , verbal consent was obtained.  The right knee was prepped appropriately after time out was performed.   Sterile technique was observed and injection of 1 cc of DepoMedrol 40mg  with several cc's of plain xylocaine. Anesthesia was provided by ethyl chloride and a 20-gauge needle was used to inject the knee area. The injection was tolerated well.  A band aid dressing was applied.  The patient was advised to apply ice later today and tomorrow to the injection sight as needed.  Encounter Diagnoses  Name Primary?   Chronic pain of right knee Yes   Trochanteric bursitis, left hip    Return in six weeks.  Call if any problem.  Precautions discussed.  Electronically Signed Darreld Mclean, MD 11/27/202410:19 AM

## 2023-10-17 ENCOUNTER — Telehealth: Payer: Self-pay

## 2023-10-17 MED ORDER — OXYCODONE-ACETAMINOPHEN 10-325 MG PO TABS: 1.0000 | ORAL_TABLET | Freq: Four times a day (QID) | ORAL | 0 refills | Status: DC | PRN

## 2023-10-17 NOTE — Telephone Encounter (Signed)
Oxycodone-Acetaminophen 10/325  Qty 120 Tablets  PATIENT USES [facility]

## 2023-10-25 ENCOUNTER — Telehealth: Payer: Self-pay

## 2023-10-25 MED ORDER — PREDNISONE 5 MG (21) PO TBPK: ORAL_TABLET | ORAL | 0 refills | Status: DC

## 2023-10-25 NOTE — Telephone Encounter (Signed)
Patient is asking if she could get a script for Prednisone until she sees you on 11/06/23. States she is hurting something awful.  PATIENT USES [facility]

## 2023-11-06 ENCOUNTER — Ambulatory Visit (INDEPENDENT_AMBULATORY_CARE_PROVIDER_SITE_OTHER): Payer: 59 | Admitting: Orthopaedic Surgery

## 2023-11-06 DIAGNOSIS — G8929 Other chronic pain: Secondary | ICD-10-CM

## 2023-11-06 DIAGNOSIS — M7062 Trochanteric bursitis, left hip: Secondary | ICD-10-CM

## 2023-11-06 DIAGNOSIS — M25511 Pain in right shoulder: Secondary | ICD-10-CM | POA: Diagnosis not present

## 2023-11-06 DIAGNOSIS — G894 Chronic pain syndrome: Secondary | ICD-10-CM

## 2023-11-06 NOTE — Progress Notes (Signed)
 PROCEDURE NOTE:  The patient request injection, verbal consent was obtained.  The left trochanteric area of the hip was prepped appropriately after time out was performed.   Sterile technique was observed and injection of 1 cc of DepoMedrol 40 mg with several cc's of plain xylocaine . Anesthesia was provided by ethyl chloride and a 20-gauge needle was used to inject the hip area. The injection was tolerated well.  A band aid dressing was applied.  The patient was advised to apply ice later today and tomorrow to the injection sight as needed.  PROCEDURE NOTE:  The patient requests injections of the right knee , verbal consent was obtained.  The right knee was prepped appropriately after time out was performed.   Sterile technique was observed and injection of 1 cc of DepoMedrol 40mg  with several cc's of plain xylocaine . Anesthesia was provided by ethyl chloride and a 20-gauge needle was used to inject the knee area. The injection was tolerated well.  A band aid dressing was applied.  The patient was advised to apply ice later today and tomorrow to the injection sight as needed.  Encounter Diagnoses  Name Primary?   Trochanteric bursitis, left hip Yes   Chronic right shoulder pain    Chronic pain syndrome    Return in six weeks.  Call if any problem.  Precautions discussed.  Electronically Signed Lemond Stable, MD 1/8/202510:22 AM

## 2023-11-13 ENCOUNTER — Telehealth: Payer: Self-pay

## 2023-11-13 MED ORDER — OXYCODONE-ACETAMINOPHEN 10-325 MG PO TABS: 1.0000 | ORAL_TABLET | Freq: Four times a day (QID) | ORAL | 0 refills | Status: DC | PRN

## 2023-11-13 NOTE — Telephone Encounter (Signed)
 Oxycodone-Acetaminophen 10/325 MG  Qty 120 Tablets   PATIENT USES [facility]

## 2023-12-11 ENCOUNTER — Telehealth: Payer: Self-pay

## 2023-12-11 NOTE — Telephone Encounter (Signed)
Oxycodone-Acetaminophen 10/325  Qty 120 Tablets  PATIENT USES [facility]

## 2023-12-12 MED ORDER — OXYCODONE-ACETAMINOPHEN 10-325 MG PO TABS
1.0000 | ORAL_TABLET | Freq: Four times a day (QID) | ORAL | 0 refills | Status: DC | PRN
Start: 2023-12-12 — End: 2024-01-09

## 2023-12-18 ENCOUNTER — Encounter: Payer: Self-pay | Admitting: Orthopaedic Surgery

## 2023-12-18 ENCOUNTER — Ambulatory Visit (INDEPENDENT_AMBULATORY_CARE_PROVIDER_SITE_OTHER): Payer: 59 | Admitting: Orthopaedic Surgery

## 2023-12-18 DIAGNOSIS — M25511 Pain in right shoulder: Secondary | ICD-10-CM | POA: Diagnosis not present

## 2023-12-18 DIAGNOSIS — G8929 Other chronic pain: Secondary | ICD-10-CM

## 2023-12-18 DIAGNOSIS — M7062 Trochanteric bursitis, left hip: Secondary | ICD-10-CM

## 2023-12-18 DIAGNOSIS — G894 Chronic pain syndrome: Secondary | ICD-10-CM | POA: Diagnosis not present

## 2023-12-18 MED ORDER — METHYLPREDNISOLONE ACETATE 40 MG/ML IJ SUSP
40.0000 mg | Freq: Once | INTRAMUSCULAR | Status: AC
  Administered 2023-12-18: 40 mg via INTRA_ARTICULAR

## 2023-12-18 MED ORDER — PREDNISONE 5 MG (21) PO TBPK: ORAL_TABLET | ORAL | 0 refills | Status: DC

## 2023-12-18 NOTE — Addendum Note (Signed)
 Addended by: Michaele Offer on: 12/18/2023 10:47 AM   Modules accepted: Orders

## 2023-12-18 NOTE — Progress Notes (Signed)
 PROCEDURE NOTE:  The patient request injection, verbal consent was obtained.  The right shoulder was prepped appropriately after time out was performed.   Sterile technique was observed and injection of 1 cc of DepoMedrol 40mg  with several cc's of plain xylocaine. Anesthesia was provided by ethyl chloride and a 20-gauge needle was used to inject the shoulder area. A posterior approach was used.  The injection was tolerated well.  A band aid dressing was applied.  The patient was advised to apply ice later today and tomorrow to the injection sight as needed.   PROCEDURE NOTE:  The patient request injection, verbal consent was obtained.  The left trochanteric area of the hip was prepped appropriately after time out was performed.   Sterile technique was observed and injection of 1 cc of DepoMedrol 40 mg with several cc's of plain xylocaine. Anesthesia was provided by ethyl chloride and a 20-gauge needle was used to inject the hip area. The injection was tolerated well.  A band aid dressing was applied.  The patient was advised to apply ice later today and tomorrow to the injection sight as needed.  Encounter Diagnoses  Name Primary?   Trochanteric bursitis, left hip Yes   Chronic right shoulder pain    Chronic pain syndrome    Return in seven weeks.  I called in prednisone dose pack for use later as needed.  Call if any problem.  Precautions discussed.  Electronically Signed Darreld Mclean, MD 2/19/20259:01 AM

## 2024-01-08 ENCOUNTER — Telehealth: Payer: Self-pay

## 2024-01-08 NOTE — Telephone Encounter (Signed)
 Oxycodone-Acetaminophen 10/325 MG  Qty 120 Tablets   PATIENT USES [facility]

## 2024-01-09 MED ORDER — OXYCODONE-ACETAMINOPHEN 10-325 MG PO TABS: 1.0000 | ORAL_TABLET | Freq: Four times a day (QID) | ORAL | 0 refills | Status: DC | PRN

## 2024-01-22 ENCOUNTER — Ambulatory Visit (INDEPENDENT_AMBULATORY_CARE_PROVIDER_SITE_OTHER): Admitting: Orthopaedic Surgery

## 2024-01-22 ENCOUNTER — Encounter: Payer: Self-pay | Admitting: Orthopaedic Surgery

## 2024-01-22 DIAGNOSIS — M25511 Pain in right shoulder: Secondary | ICD-10-CM

## 2024-01-22 DIAGNOSIS — M7062 Trochanteric bursitis, left hip: Secondary | ICD-10-CM | POA: Diagnosis not present

## 2024-01-22 DIAGNOSIS — G8929 Other chronic pain: Secondary | ICD-10-CM

## 2024-01-22 DIAGNOSIS — G894 Chronic pain syndrome: Secondary | ICD-10-CM | POA: Diagnosis not present

## 2024-01-22 MED ORDER — METHYLPREDNISOLONE ACETATE 40 MG/ML IJ SUSP
40.0000 mg | Freq: Once | INTRAMUSCULAR | Status: AC
  Administered 2024-01-22: 40 mg via INTRA_ARTICULAR

## 2024-01-22 NOTE — Progress Notes (Signed)
 PROCEDURE NOTE:  The patient request injection, verbal consent was obtained.  The right shoulder was prepped appropriately after time out was performed.   Sterile technique was observed and injection of 1 cc of DepoMedrol 40mg  with several cc's of plain xylocaine. Anesthesia was provided by ethyl chloride and a 20-gauge needle was used to inject the shoulder area. A posterior approach was used.  The injection was tolerated well.  A band aid dressing was applied.  The patient was advised to apply ice later today and tomorrow to the injection sight as needed.  PROCEDURE NOTE:  The patient request injection, verbal consent was obtained.  The left trochanteric area of the hip was prepped appropriately after time out was performed.   Sterile technique was observed and injection of 1 cc of DepoMedrol 40 mg with several cc's of plain xylocaine. Anesthesia was provided by ethyl chloride and a 20-gauge needle was used to inject the hip area. The injection was tolerated well.  A band aid dressing was applied.  The patient was advised to apply ice later today and tomorrow to the injection sight as needed.  Encounter Diagnoses  Name Primary?   Trochanteric bursitis, left hip Yes   Chronic right shoulder pain    Chronic pain syndrome    Return in six weeks.  Call if any problem.  Precautions discussed.  Electronically Signed Darreld Mclean, MD 3/26/20259:07 AM

## 2024-01-22 NOTE — Addendum Note (Signed)
 Addended byCaffie Damme on: 01/22/2024 04:32 PM   Modules accepted: Orders

## 2024-02-05 ENCOUNTER — Telehealth: Payer: Self-pay

## 2024-02-05 ENCOUNTER — Ambulatory Visit: Payer: 59 | Admitting: Orthopaedic Surgery

## 2024-02-05 MED ORDER — OXYCODONE-ACETAMINOPHEN 10-325 MG PO TABS: 1.0000 | ORAL_TABLET | Freq: Four times a day (QID) | ORAL | 0 refills | Status: DC | PRN

## 2024-02-05 NOTE — Telephone Encounter (Signed)
 Oxycodone-Acetaminophen 10/325 MG  Qty 120 Tablets  PATIENT USES [facility] IN [place]

## 2024-03-04 ENCOUNTER — Encounter: Payer: Self-pay | Admitting: Orthopaedic Surgery

## 2024-03-04 ENCOUNTER — Ambulatory Visit (INDEPENDENT_AMBULATORY_CARE_PROVIDER_SITE_OTHER): Admitting: Orthopaedic Surgery

## 2024-03-04 DIAGNOSIS — M25511 Pain in right shoulder: Secondary | ICD-10-CM | POA: Diagnosis not present

## 2024-03-04 DIAGNOSIS — M7062 Trochanteric bursitis, left hip: Secondary | ICD-10-CM

## 2024-03-04 DIAGNOSIS — G894 Chronic pain syndrome: Secondary | ICD-10-CM | POA: Diagnosis not present

## 2024-03-04 DIAGNOSIS — G8929 Other chronic pain: Secondary | ICD-10-CM

## 2024-03-04 MED ORDER — OXYCODONE-ACETAMINOPHEN 10-325 MG PO TABS
1.0000 | ORAL_TABLET | Freq: Four times a day (QID) | ORAL | 0 refills | Status: DC | PRN
Start: 2024-03-04 — End: 2024-04-01

## 2024-03-04 MED ORDER — METHYLPREDNISOLONE ACETATE 40 MG/ML IJ SUSP
40.0000 mg | Freq: Once | INTRAMUSCULAR | Status: AC
  Administered 2024-03-04: 40 mg via INTRA_ARTICULAR

## 2024-03-04 NOTE — Addendum Note (Signed)
 Addended byArla Lab on: 03/04/2024 08:27 AM   Modules accepted: Orders

## 2024-03-04 NOTE — Progress Notes (Signed)
 PROCEDURE NOTE:  The patient request injection, verbal consent was obtained.  The right shoulder was prepped appropriately after time out was performed.   Sterile technique was observed and injection of 1 cc of DepoMedrol 40mg  with several cc's of plain xylocaine . Anesthesia was provided by ethyl chloride and a 20-gauge needle was used to inject the shoulder area. A posterior approach was used.  The injection was tolerated well.  A band aid dressing was applied.  The patient was advised to apply ice later today and tomorrow to the injection sight as needed.  PROCEDURE NOTE:  The patient request injection, verbal consent was obtained.  The left trochanteric area of the hip was prepped appropriately after time out was performed.   Sterile technique was observed and injection of 1 cc of DepoMedrol 40 mg with several cc's of plain xylocaine . Anesthesia was provided by ethyl chloride and a 20-gauge needle was used to inject the hip area. The injection was tolerated well.  A band aid dressing was applied.  The patient was advised to apply ice later today and tomorrow to the injection sight as needed.  Encounter Diagnoses  Name Primary?   Trochanteric bursitis, left hip Yes   Chronic right shoulder pain    Chronic pain syndrome    I have reviewed the [place]  Controlled Substance Reporting System web site prior to prescribing narcotic medicine for this patient.  Return in six weeks.  Call if any problem.  Precautions discussed.  Electronically Signed Pleasant Brilliant, MD 5/7/20258:13 AM

## 2024-04-01 ENCOUNTER — Telehealth: Payer: Self-pay

## 2024-04-01 MED ORDER — OXYCODONE-ACETAMINOPHEN 10-325 MG PO TABS: 1.0000 | ORAL_TABLET | Freq: Four times a day (QID) | ORAL | 0 refills | Status: DC | PRN

## 2024-04-01 NOTE — Telephone Encounter (Signed)
 Oxycodone -Acetaminophen  10/325 MG  Qty 120 Tablets  Patient is also asking if you would send in some Prednisone  for her  PATIENT USES [facility]

## 2024-04-02 ENCOUNTER — Other Ambulatory Visit (INDEPENDENT_AMBULATORY_CARE_PROVIDER_SITE_OTHER): Payer: Self-pay

## 2024-04-02 ENCOUNTER — Encounter: Payer: Self-pay | Admitting: Orthopaedic Surgery

## 2024-04-02 ENCOUNTER — Ambulatory Visit (INDEPENDENT_AMBULATORY_CARE_PROVIDER_SITE_OTHER): Admitting: Orthopaedic Surgery

## 2024-04-02 VITALS — BP 97/63 | HR 80 | Ht 64.0 in | Wt 151.0 lb

## 2024-04-02 DIAGNOSIS — S93431A Sprain of tibiofibular ligament of right ankle, initial encounter: Secondary | ICD-10-CM

## 2024-04-02 NOTE — Progress Notes (Signed)
 I fell yesterday and hurt my ankle  She fell at home yesterday and twisted her right ankle.  She has pain, swelling and bruising. S he has no other injury.  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  Right ankle has lateral swelling and dorsal swelling with ecchymosis present.  NV intact. ROM is painful of the ankle.  Limp to the right.  X-rays were done of the right ankle, reported separately.  She has lateral soft tissue swelling but no fracture.  Encounter Diagnosis  Name Primary?   Sprain of tibiofibular ligament of right ankle, initial encounter Yes   I will give CAM walker.  I have explained Contrast Baths and given sheet of instructions.  I had called in pain medicine yesterday for her for her chronic back and hip pain.  Continue.  Return in one week.  Call if any problem.  Precautions discussed.  Electronically Signed Pleasant Brilliant, MD 6/5/202510:02 AM

## 2024-04-02 NOTE — Addendum Note (Signed)
 Addended byArla Lab on: 04/02/2024 10:03 AM   Modules accepted: Orders

## 2024-04-02 NOTE — Patient Instructions (Signed)
Contrast baths are excellent for joints of the body which have been sprained or strained and have swelling. Professional athletes use this to reduce swelling and this helps them to resume activity as soon as possible.  Contrast baths consist of placing a foot or hand into a basin of warm (not hot) water for a short period of time and then taking the extremity and moving it immediately into a solution of ice water. It is kept in the ice cold water for a short period of time, as long as the patient can tolerate, then moved back into the warm solution.  The hand or foot is then removed and put back in the cold solution and kept there as long as possible and then removed and no further soaks are done.   When you first start doing this you will experience the "pins and needle" effect putting your hand in the cold solution. You may only tolerate the ice cold solution for a period of 5 to 30 seconds. You will not like this at first. It will hurt. Please be patient and attempt it as best as you can.   The goal is to place the extremity in the cold water for 5 minutes then place in warm water for 5 min. Repeating until 30 minutes have passed. (that's 3 times in each)  In a few days you will enjoy having your foot or hand in the cold water. As the treatment progresses, you will then find the pins and needle effect taking place after you have had your extremity in the cold solution and then placing it in the warm. It will then be the warm solution that you will dread.  You should notice decreased swelling in your extremity.   After finishing the contrast baths, you need to elevate your extremity for 10 to 15 minutes. For the ankle you will have your toes higher than your nose, and for the hand you will need your hand above the nose as well.   You will need to continue this for 7-10 days unless otherwise directed.    If you have any questions, please contact our office during office hours.   

## 2024-04-09 ENCOUNTER — Ambulatory Visit: Admitting: Orthopaedic Surgery

## 2024-04-15 ENCOUNTER — Ambulatory Visit (INDEPENDENT_AMBULATORY_CARE_PROVIDER_SITE_OTHER): Admitting: Orthopaedic Surgery

## 2024-04-15 ENCOUNTER — Ambulatory Visit: Admitting: Orthopaedic Surgery

## 2024-04-15 ENCOUNTER — Encounter: Payer: Self-pay | Admitting: Orthopaedic Surgery

## 2024-04-15 DIAGNOSIS — G894 Chronic pain syndrome: Secondary | ICD-10-CM

## 2024-04-15 DIAGNOSIS — M25511 Pain in right shoulder: Secondary | ICD-10-CM

## 2024-04-15 DIAGNOSIS — G8929 Other chronic pain: Secondary | ICD-10-CM

## 2024-04-15 DIAGNOSIS — M7062 Trochanteric bursitis, left hip: Secondary | ICD-10-CM

## 2024-04-15 MED ORDER — METHYLPREDNISOLONE ACETATE 40 MG/ML IJ SUSP
40.0000 mg | Freq: Once | INTRAMUSCULAR | Status: AC
  Administered 2024-04-15: 40 mg via INTRA_ARTICULAR

## 2024-04-15 NOTE — Addendum Note (Signed)
 Addended by: [place] Snow T on: 04/15/2024 02:08 PM   Modules accepted: Orders

## 2024-04-15 NOTE — Progress Notes (Signed)
 PROCEDURE NOTE:  The patient request injection, verbal consent was obtained.  The left trochanteric area of the hip was prepped appropriately after time out was performed.   Sterile technique was observed and injection of 1 cc of DepoMedrol 40 mg with several cc's of plain xylocaine . Anesthesia was provided by ethyl chloride and a 20-gauge needle was used to inject the hip area. The injection was tolerated well.  A band aid dressing was applied.  The patient was advised to apply ice later today and tomorrow to the injection sight as needed.  PROCEDURE NOTE:  The patient request injection, verbal consent was obtained.  The right shoulder was prepped appropriately after time out was performed.   Sterile technique was observed and injection of 1 cc of DepoMedrol 40mg  with several cc's of plain xylocaine . Anesthesia was provided by ethyl chloride and a 20-gauge needle was used to inject the shoulder area. A posterior approach was used.  The injection was tolerated well.  A band aid dressing was applied.  The patient was advised to apply ice later today and tomorrow to the injection sight as needed.  Encounter Diagnoses  Name Primary?   Trochanteric bursitis, left hip Yes   Chronic right shoulder pain    Chronic pain syndrome    Return in six weeks.  Call if any problem.  Precautions discussed.  Electronically Signed Pleasant Brilliant, MD 6/18/202510:14 AM

## 2024-04-29 ENCOUNTER — Telehealth: Payer: Self-pay

## 2024-04-29 MED ORDER — OXYCODONE-ACETAMINOPHEN 10-325 MG PO TABS: 1.0000 | ORAL_TABLET | Freq: Four times a day (QID) | ORAL | 0 refills | Status: DC | PRN

## 2024-04-29 NOTE — Telephone Encounter (Signed)
 Oxycodone-Acetaminophen 10/325 MG  Qty 120 Tablets  PATIENT USES [facility] IN [place]

## 2024-05-27 ENCOUNTER — Ambulatory Visit (INDEPENDENT_AMBULATORY_CARE_PROVIDER_SITE_OTHER): Admitting: Orthopaedic Surgery

## 2024-05-27 ENCOUNTER — Encounter: Payer: Self-pay | Admitting: Orthopaedic Surgery

## 2024-05-27 DIAGNOSIS — M7062 Trochanteric bursitis, left hip: Secondary | ICD-10-CM

## 2024-05-27 DIAGNOSIS — G8929 Other chronic pain: Secondary | ICD-10-CM

## 2024-05-27 DIAGNOSIS — M25511 Pain in right shoulder: Secondary | ICD-10-CM | POA: Diagnosis not present

## 2024-05-27 DIAGNOSIS — G894 Chronic pain syndrome: Secondary | ICD-10-CM

## 2024-05-27 MED ORDER — OXYCODONE-ACETAMINOPHEN 10-325 MG PO TABS: 1.0000 | ORAL_TABLET | Freq: Four times a day (QID) | ORAL | 0 refills | Status: DC | PRN

## 2024-05-27 MED ORDER — METHYLPREDNISOLONE ACETATE 40 MG/ML IJ SUSP
40.0000 mg | Freq: Once | INTRAMUSCULAR | Status: AC
  Administered 2024-05-27: 40 mg via INTRA_ARTICULAR

## 2024-05-27 NOTE — Addendum Note (Signed)
 Addended by: MARCINE HUSBAND T on: 05/27/2024 02:00 PM   Modules accepted: Orders

## 2024-05-27 NOTE — Progress Notes (Signed)
 PROCEDURE NOTE:  The patient request injection, verbal consent was obtained.  The right shoulder was prepped appropriately after time out was performed.   Sterile technique was observed and injection of 1 cc of DepoMedrol 40mg  with several cc's of plain xylocaine . Anesthesia was provided by ethyl chloride and a 20-gauge needle was used to inject the shoulder area. A posterior approach was used.  The injection was tolerated well.  A band aid dressing was applied.  The patient was advised to apply ice later today and tomorrow to the injection sight as needed.  PROCEDURE NOTE:  The patient request injection, verbal consent was obtained.  The left trochanteric area of the hip was prepped appropriately after time out was performed.   Sterile technique was observed and injection of 1 cc of DepoMedrol 40 mg with several cc's of plain xylocaine . Anesthesia was provided by ethyl chloride and a 20-gauge needle was used to inject the hip area. The injection was tolerated well.  A band aid dressing was applied.  The patient was advised to apply ice later today and tomorrow to the injection sight as needed.  Encounter Diagnoses  Name Primary?   Trochanteric bursitis, left hip Yes   Chronic right shoulder pain    Chronic pain syndrome    I have reviewed the   Controlled Substance Reporting System web site prior to prescribing narcotic medicine for this patient.  Call if any problem.  Precautions discussed.  Return in six weeks.  Electronically Signed Lemond Stable, MD 7/30/20259:16 AM

## 2024-06-19 ENCOUNTER — Encounter: Payer: Self-pay | Admitting: Radiology

## 2024-06-24 ENCOUNTER — Telehealth: Payer: Self-pay | Admitting: Orthopaedic Surgery

## 2024-07-08 ENCOUNTER — Ambulatory Visit (INDEPENDENT_AMBULATORY_CARE_PROVIDER_SITE_OTHER): Admitting: Orthopaedic Surgery

## 2024-07-08 DIAGNOSIS — G894 Chronic pain syndrome: Secondary | ICD-10-CM | POA: Diagnosis not present

## 2024-07-08 DIAGNOSIS — M7062 Trochanteric bursitis, left hip: Secondary | ICD-10-CM | POA: Diagnosis not present

## 2024-07-08 DIAGNOSIS — M25511 Pain in right shoulder: Secondary | ICD-10-CM

## 2024-07-08 DIAGNOSIS — G8929 Other chronic pain: Secondary | ICD-10-CM

## 2024-07-08 MED ORDER — METHYLPREDNISOLONE ACETATE 40 MG/ML IJ SUSP
40.0000 mg | Freq: Once | INTRAMUSCULAR | Status: AC
  Administered 2024-07-08: 40 mg via INTRA_ARTICULAR

## 2024-07-08 NOTE — Addendum Note (Signed)
 Addended by: MARCINE HUSBAND T on: 07/08/2024 10:36 AM   Modules accepted: Orders

## 2024-07-08 NOTE — Progress Notes (Signed)
 I am hurting today.  She has more pain in the right shoulder today.  She has no trauma.  Her left hip is tender also.  NV intact.  Right shoulder has good ROM with pain in the extremes.  NV intact.  Left hip is tender over the trochanteric area. ROM is full.  No redness, no swelling, no paresthesias.  Encounter Diagnoses  Name Primary?   Chronic pain syndrome Yes   Trochanteric bursitis, left hip    Chronic right shoulder pain    I will refer her to a pain clinic in the [facility].  I have informed the patient I will be retiring from medical practice and from this office on July 30, 2024.  The patient has been offered continuing care with Dr. Margrette or Dr. Onesimo of this office.  The patient may choose another provider and the records will be forwarded after proper signature and notification.  Patient understands and agrees.  PROCEDURE NOTE:  The patient request injection, verbal consent was obtained.  The left trochanteric area of the hip was prepped appropriately after time out was performed.   Sterile technique was observed and injection of 1 cc of DepoMedrol 40 mg with several cc's of plain xylocaine . Anesthesia was provided by ethyl chloride and a 20-gauge needle was used to inject the hip area. The injection was tolerated well.  A band aid dressing was applied.  The patient was advised to apply ice later today and tomorrow to the injection sight as needed.  PROCEDURE NOTE:  The patient request injection, verbal consent was obtained.  The right shoulder was prepped appropriately after time out was performed.   Sterile technique was observed and injection of 1 cc of DepoMedrol 40mg  with several cc's of plain xylocaine . Anesthesia was provided by ethyl chloride and a 20-gauge needle was used to inject the shoulder area. A posterior approach was used.  The injection was tolerated well.  A band aid dressing was applied.  The patient was advised to apply ice later  today and tomorrow to the injection sight as needed.  Return in six weeks.  Call if any problem.  Precautions discussed.  Electronically Signed Lemond Stable, MD 9/10/20259:09 AM

## 2024-07-22 ENCOUNTER — Telehealth: Payer: Self-pay | Admitting: Orthopaedic Surgery

## 2024-08-03 ENCOUNTER — Encounter: Attending: Physical Medicine and Rehabilitation | Admitting: Physical Medicine and Rehabilitation

## 2024-08-03 ENCOUNTER — Encounter: Payer: Self-pay | Admitting: Physical Medicine and Rehabilitation

## 2024-08-03 VITALS — BP 138/76 | HR 83 | Ht 64.0 in | Wt 125.8 lb

## 2024-08-03 DIAGNOSIS — M25511 Pain in right shoulder: Secondary | ICD-10-CM | POA: Diagnosis not present

## 2024-08-03 DIAGNOSIS — Z5181 Encounter for therapeutic drug level monitoring: Secondary | ICD-10-CM | POA: Insufficient documentation

## 2024-08-03 DIAGNOSIS — M48062 Spinal stenosis, lumbar region with neurogenic claudication: Secondary | ICD-10-CM | POA: Diagnosis present

## 2024-08-03 DIAGNOSIS — G8929 Other chronic pain: Secondary | ICD-10-CM | POA: Diagnosis present

## 2024-08-03 DIAGNOSIS — Z79891 Long term (current) use of opiate analgesic: Secondary | ICD-10-CM | POA: Insufficient documentation

## 2024-08-03 DIAGNOSIS — G894 Chronic pain syndrome: Secondary | ICD-10-CM | POA: Diagnosis not present

## 2024-08-03 NOTE — Progress Notes (Unsigned)
 Subjective:    Patient ID: Judith Rodriguez, female    DOB: 1966/10/05, 58 y.o.   MRN: [id number]  HPI  Judith Rodriguez is a 58 year old woman who presents to establish care for chronic pain.  1) Right shoulder pain -she was a Education administrator -she stays active otherwise she becomes stiff -every morning she wakes up with stiff -ice and salonpas and her oxycodone  help  2) Lumbar spine pain: -radiates into right leg -was told that surgery would be too dangerous for her  Pain Inventory Average Pain 9 Pain Right Now 7 My pain is intermittent, constant, sharp, burning, dull, stabbing, and aching  In the last 24 hours, has pain interfered with the following? General activity 7 Relation with others 2 Enjoyment of life 5 What TIME of day is your pain at its worst? daytime, evening, and night Sleep (in general) Poor  Pain is worse with: walking, bending, sitting, and standing Pain improves with: rest, heat/ice, therapy/exercise, pacing activities, medication, TENS, and injections Relief from Meds: 2  use a cane how many minutes can you walk? 5 minutes ability to climb steps?  yes do you drive?  yes  not employed: date last employed 5 years ago  No problems in this area  Any changes since last visit?  yes  Primary care Comer Raymond, PA-C Orthopedist Lemond Stable, MD    Family History  Problem Relation Age of Onset   Cancer Mother        lung   Cancer Father        lung   Hypertension Father    Hypertension Brother    Aneurysm Paternal Grandmother        brain   Heart disease Paternal Grandfather        heart attack   Hypertension Sister    Colon cancer Paternal Aunt        less than age 67   Social History   Socioeconomic History   Marital status: Married    Spouse name: Not on file   Number of children: Not on file   Years of education: Not on file   Highest education level: Not on file  Occupational History   Not on file  Tobacco Use   Smoking status: Every  Day    Current packs/day: 0.25    Average packs/day: 0.3 packs/day for 33.0 years (8.3 ttl pk-yrs)    Types: Cigarettes   Smokeless tobacco: Never  Vaping Use   Vaping status: Never Used  Substance and Sexual Activity   Alcohol use: No   Drug use: No   Sexual activity: Not Currently    Birth control/protection: Surgical    Comment: hyst  Other Topics Concern   Not on file  Social History Narrative   Not on file   Social Drivers of Health   Financial Resource Strain: Not on file  Food Insecurity: Not on file  Transportation Needs: Not on file  Physical Activity: Not on file  Stress: Not on file  Social Connections: Not on file   Past Surgical History:  Procedure Laterality Date   ABDOMINAL HYSTERECTOMY     CESAREAN SECTION     COLONOSCOPY WITH PROPOFOL  N/A 03/06/2018   Rourk: 4 colon polyps removed.  One was 30 mm at the hepatic flexure removed piecemeal.  Diverticulosis.  Nonbleeding internal hemorrhoids.  Pathology revealed tubular adenomas.  Advised for repeat colonoscopy in 1 year   COLONOSCOPY WITH PROPOFOL  N/A 10/13/2020   Procedure: COLONOSCOPY WITH PROPOFOL ;  Surgeon: Shaaron Lamar HERO, MD;  [facility];  Service: Endoscopy;  Laterality: N/A;  7:30am   HUMERUS FRACTURE SURGERY Right 12/2019   POLYPECTOMY  03/06/2018   Procedure: POLYPECTOMY;  Surgeon: Shaaron Lamar HERO, MD;  [facility]: [facility];  Service: Endoscopy;;  cecal polyp cs, hepatic flexure polyps   SALPINGOOPHORECTOMY Left    Past Medical History:  Diagnosis Date   Arthritis    Bulging lumbar disc    pushing on sciatic nerve L4   Carpal tunnel syndrome    Depression 12/01/2015   Dyslipidemia 12/05/2015   Will try diet and exercise first   Hot flashes 12/01/2015   Vitamin D  deficiency 12/05/2015   Take vitamin D3 5000 iu daily   BP 138/76 (BP Location: Left Arm, Patient Position: Sitting, Cuff Size: Normal)   Pulse 83   Ht 5' 4 (1.626 m)   Wt 125 lb 12.8 oz (57.1 kg)   SpO2 97%   BMI 21.59  kg/m   Opioid Risk Score:   Fall Risk Score:  `1  Depression screen [facility] 2/9     08/03/2024   11:29 AM 06/17/2017    9:33 AM 12/17/2016    2:44 PM 12/17/2016    2:43 PM  Depression screen PHQ 2/9  Decreased Interest 3 0 1 1  Down, Depressed, Hopeless 1 0 1 1  PHQ - 2 Score 4 0 2 2  Altered sleeping 3  0   Tired, decreased energy 1  3   Change in appetite 3  0   Feeling bad or failure about yourself  1  0   Trouble concentrating 0  0   Moving slowly or fidgety/restless 1  0   Suicidal thoughts 0  0    PHQ-9 Score 13  5   Difficult doing work/chores Very difficult        Data saved with a previous flowsheet row definition      Review of Systems  Constitutional:  Positive for appetite change and unexpected weight change.       Weight loss, poor appetite  Musculoskeletal:  Positive for arthralgias, back pain, gait problem, joint swelling and myalgias.       Bilateral shoulder pain, left hip and back pain, right knee pain, neck pain, elbow pain  Neurological:  Positive for tremors and weakness.       Trouble walking, tremors, spasms, tingling depression, weakness  Psychiatric/Behavioral:         Depression  All other systems reviewed and are negative.      Objective:   Physical Exam Gen: no distress, normal appearing HEENT: oral mucosa pink and moist, NCAT Cardio: Reg rate Chest: normal effort, normal rate of breathing Abd: soft, non-distended Ext: no edema Psych: pleasant, normal affect Skin: intact Neuro: Alert and oriented x3 Musculoskeletal: R shoulder range of motion,        Assessment & Plan:   1) Chronic Pain Syndrome secondary to chronic pain 2/2 arthritis: -discussed that she has had benefit from percocet for her pain -discussed that gabapentin  affected cognition -discussed that smoking can worsen pain -discussed that back pain makes it so she cannot walk -discussed that she gets steroid injections in Dr. Donelda office -will do UDS and pain  contract today; will prescribe oxycoodne 10mg  q6h prn if UDS containes expected metabiolites -Discussed current symptoms of pain and history of pain.  -Discussed benefits of exercise in reducing pain. -Discussed following foods that may reduce pain: 1) Ginger (especially studied  for arthritis)- reduce leukotriene production to decrease inflammation 2) Blueberries- high in phytonutrients that decrease inflammation 3) Salmon- marine omega-3s reduce joint swelling and pain 4) Pumpkin seeds- reduce inflammation 5) dark chocolate- reduces inflammation 6) turmeric- reduces inflammation 7) tart cherries - reduce pain and stiffness 8) extra virgin olive oil - its compound olecanthal helps to block prostaglandins  9) chili peppers- can be eaten or applied topically via capsaicin 10) mint- helpful for headache, muscle aches, joint pain, and itching 11) garlic- reduces inflammation 12) Green tea- reduces inflammation and oxidative stress, helps with weight loss, may reduce the risk of cancer, recommend Double Green Matcha [place] of Tea daily  Link to further information on diet for chronic pain: [email or web address]    2) Right knee OA: -discussed viscosupplementation

## 2024-08-03 NOTE — Patient Instructions (Signed)
 [place]  Foods that may reduce pain: 1) Ginger (especially studied for arthritis)- reduce leukotriene production to decrease inflammation 2) Blueberries- high in phytonutrients that decrease inflammation 3) Salmon- marine omega-3s reduce joint swelling and pain 4) Pumpkin seeds- reduce inflammation 5) dark chocolate- reduces inflammation 6) turmeric- reduces inflammation 7) tart cherries - reduce pain and stiffness 8) extra virgin olive oil - its compound olecanthal helps to block prostaglandins  9) chili peppers- can be eaten or applied topically via capsaicin 10) mint- helpful for headache, muscle aches, joint pain, and itching 11) garlic- reduces inflammation 12) Green tea- reduces inflammation and oxidative stress, helps with weight loss, may reduce the risk of cancer, recommend Double Green Matcha [place] of Tea daily  Link to further information on diet for chronic pain: [email or web address]

## 2024-08-07 ENCOUNTER — Other Ambulatory Visit: Payer: Self-pay | Admitting: Physical Medicine and Rehabilitation

## 2024-08-07 LAB — DRUG TOX MONITOR 1 W/CONF, ORAL FLD
Amphetamines: NEGATIVE ng/mL (ref <10)
Barbiturates: NEGATIVE ng/mL (ref <10)
Benzodiazepines: NEGATIVE ng/mL (ref <0.50)
Buprenorphine: NEGATIVE ng/mL (ref <0.10)
Cocaine: NEGATIVE ng/mL (ref <5.0)
Codeine: NEGATIVE ng/mL (ref <2.5)
Cotinine: >250 ng/mL — ABNORMAL HIGH (ref <5.0)
Dihydrocodeine: NEGATIVE ng/mL (ref <2.5)
Fentanyl: NEGATIVE ng/mL (ref <0.10)
Heroin Metabolite: NEGATIVE ng/mL (ref <1.0)
Hydrocodone: NEGATIVE ng/mL (ref <2.5)
Hydromorphone: NEGATIVE ng/mL (ref <2.5)
MARIJUANA: NEGATIVE ng/mL (ref <2.5)
MDMA: NEGATIVE ng/mL (ref <10)
Meprobamate: NEGATIVE ng/mL (ref <2.5)
Methadone: NEGATIVE ng/mL (ref <5.0)
Morphine: NEGATIVE ng/mL (ref <2.5)
Nicotine Metabolite: POSITIVE ng/mL — AB (ref <5.0)
Norhydrocodone: NEGATIVE ng/mL (ref <2.5)
Noroxycodone: 51 ng/mL — ABNORMAL HIGH (ref <2.5)
Opiates: POSITIVE ng/mL — AB (ref <2.5)
Oxycodone: >250 ng/mL — ABNORMAL HIGH (ref <2.5)
Oxymorphone: NEGATIVE ng/mL (ref <2.5)
Phencyclidine: NEGATIVE ng/mL (ref <10)
Tapentadol: NEGATIVE ng/mL (ref <5.0)
Tramadol: NEGATIVE ng/mL (ref <5.0)
Zolpidem: NEGATIVE ng/mL (ref <5.0)

## 2024-08-07 LAB — DRUG TOX ALC METAB W/CON, ORAL FLD: Alcohol Metabolite: NEGATIVE ng/mL (ref <25)

## 2024-08-07 MED ORDER — OXYCODONE-ACETAMINOPHEN 10-325 MG PO TABS: 1.0000 | ORAL_TABLET | Freq: Four times a day (QID) | ORAL | 0 refills | Status: DC | PRN

## 2024-08-21 ENCOUNTER — Encounter: Payer: Self-pay | Admitting: *Deleted

## 2024-08-21 NOTE — Telephone Encounter (Signed)
 Opened in error

## 2024-08-31 ENCOUNTER — Encounter: Payer: Self-pay | Admitting: Radiology

## 2024-09-09 ENCOUNTER — Telehealth: Payer: Self-pay

## 2024-09-10 ENCOUNTER — Encounter: Payer: Self-pay | Admitting: Registered Nurse

## 2024-09-10 ENCOUNTER — Encounter: Attending: Registered Nurse | Admitting: Registered Nurse

## 2024-09-10 VITALS — BP 136/78 | HR 110 | Ht 64.0 in | Wt 124.2 lb

## 2024-09-10 DIAGNOSIS — R000 Tachycardia, unspecified: Secondary | ICD-10-CM | POA: Insufficient documentation

## 2024-09-10 DIAGNOSIS — Z5181 Encounter for therapeutic drug level monitoring: Secondary | ICD-10-CM | POA: Insufficient documentation

## 2024-09-10 DIAGNOSIS — M48062 Spinal stenosis, lumbar region with neurogenic claudication: Secondary | ICD-10-CM | POA: Diagnosis not present

## 2024-09-10 DIAGNOSIS — M25511 Pain in right shoulder: Secondary | ICD-10-CM | POA: Insufficient documentation

## 2024-09-10 DIAGNOSIS — G894 Chronic pain syndrome: Secondary | ICD-10-CM | POA: Diagnosis not present

## 2024-09-10 DIAGNOSIS — G8929 Other chronic pain: Secondary | ICD-10-CM | POA: Diagnosis present

## 2024-09-10 DIAGNOSIS — Z79891 Long term (current) use of opiate analgesic: Secondary | ICD-10-CM | POA: Insufficient documentation

## 2024-09-10 MED ORDER — OXYCODONE-ACETAMINOPHEN 10-325 MG PO TABS: 1.0000 | ORAL_TABLET | Freq: Four times a day (QID) | ORAL | 0 refills | Status: DC | PRN

## 2024-09-10 NOTE — Progress Notes (Signed)
 Subjective:    Patient ID: Judith Rodriguez, female    DOB: 10/11/1966, 58 y.o.   MRN: [id number]  HPI: Judith Rodriguez is a 58 y.o. female who returns for follow up appointment for chronic pain and medication refill. She states her pain is located in her  right shoulder, mid- lower back. She rates  her pain 9. Her current exercise regime is walking and performing stretching exercises.  Judith Rodriguez arrived tachycardic, apical pulse checked.   Judith Rodriguez equivalent is 60.00 MME. Judith Rodriguez took too many medication, she received warning letter and she verbalizes understanding. Narcotic Contract was reviewed.     Last Oral Swab was Performed on 08/03/2024, it was consistent.   Pain Inventory Average Pain 8 Pain Right Now 9 My pain is sharp, burning, dull, stabbing, and aching  In the last 24 hours, has pain interfered with the following? General activity 6 Relation with others 4 Enjoyment of life 3 What TIME of day is your pain at its worst? morning , daytime, evening, and night Sleep (in general) Poor  Pain is worse with: walking, bending, sitting, standing, and some activites Pain improves with: heat/ice, medication, and TENS Relief from Meds: 8  Family History  Problem Relation Age of Onset   Cancer Mother        lung   Cancer Father        lung   Hypertension Father    Hypertension Brother    Aneurysm Paternal Grandmother        brain   Heart disease Paternal Grandfather        heart attack   Hypertension Sister    Colon cancer Paternal Aunt        less than age 38   Social History   Socioeconomic History   Marital status: Married    Spouse name: Not on file   Number of children: Not on file   Years of education: Not on file   Highest education level: Not on file  Occupational History   Not on file  Tobacco Use   Smoking status: Every Day    Current packs/day: 0.25    Average packs/day: 0.3 packs/day for 33.0 years (8.3 ttl pk-yrs)    Types: Cigarettes    Smokeless tobacco: Never  Vaping Use   Vaping status: Never Used  Substance and Sexual Activity   Alcohol use: No   Drug use: No   Sexual activity: Not Currently    Birth control/protection: Surgical    Comment: hyst  Other Topics Concern   Not on file  Social History Narrative   Not on file   Social Drivers of Health   Financial Resource Strain: Not on file  Food Insecurity: Not on file  Transportation Needs: Not on file  Physical Activity: Not on file  Stress: Not on file  Social Connections: Not on file   Past Surgical History:  Procedure Laterality Date   ABDOMINAL HYSTERECTOMY     CESAREAN SECTION     COLONOSCOPY WITH PROPOFOL  N/A 03/06/2018   Rourk: 4 colon polyps removed.  One was 30 mm at the hepatic flexure removed piecemeal.  Diverticulosis.  Nonbleeding internal hemorrhoids.  Pathology revealed tubular adenomas.  Advised for repeat colonoscopy in 1 year   COLONOSCOPY WITH PROPOFOL  N/A 10/13/2020   Procedure: COLONOSCOPY WITH PROPOFOL ;  Surgeon: Shaaron Lamar HERO, MD;  [facility];  Service: Endoscopy;  Laterality: N/A;  7:30am   HUMERUS FRACTURE SURGERY Right 12/2019  POLYPECTOMY  03/06/2018   Procedure: POLYPECTOMY;  Surgeon: Shaaron Lamar HERO, MD;  [facility];  Service: Endoscopy;;  cecal polyp cs, hepatic flexure polyps   SALPINGOOPHORECTOMY Left    Past Surgical History:  Procedure Laterality Date   ABDOMINAL HYSTERECTOMY     CESAREAN SECTION     COLONOSCOPY WITH PROPOFOL  N/A 03/06/2018   Rourk: 4 colon polyps removed.  One was 30 mm at the hepatic flexure removed piecemeal.  Diverticulosis.  Nonbleeding internal hemorrhoids.  Pathology revealed tubular adenomas.  Advised for repeat colonoscopy in 1 year   COLONOSCOPY WITH PROPOFOL  N/A 10/13/2020   Procedure: COLONOSCOPY WITH PROPOFOL ;  Surgeon: Shaaron Lamar HERO, MD;  [facility];  Service: Endoscopy;  Laterality: N/A;  7:30am   HUMERUS FRACTURE SURGERY Right 12/2019   POLYPECTOMY   03/06/2018   Procedure: POLYPECTOMY;  Surgeon: Shaaron Lamar HERO, MD;  [facility];  Service: Endoscopy;;  cecal polyp cs, hepatic flexure polyps   SALPINGOOPHORECTOMY Left    Past Medical History:  Diagnosis Date   Arthritis    Bulging lumbar disc    pushing on sciatic nerve L4   Carpal tunnel syndrome    Depression 12/01/2015   Dyslipidemia 12/05/2015   Will try diet and exercise first   Hot flashes 12/01/2015   Vitamin D  deficiency 12/05/2015   Take vitamin D3 5000 iu daily   BP (!) 168/74   Pulse (!) 118   Ht 5' 4 (1.626 m)   Wt 124 lb 3.2 oz (56.3 kg)   SpO2 98%   BMI 21.32 kg/m   Opioid Risk Score:   Fall Risk Score:  `1  Depression screen [facility] 2/9     09/10/2024   10:56 AM 08/03/2024   11:29 AM 06/17/2017    9:33 AM 12/17/2016    2:44 PM 12/17/2016    2:43 PM  Depression screen PHQ 2/9  Decreased Interest 3 3 0 1 1  Down, Depressed, Hopeless 3 1 0 1 1  PHQ - 2 Score 6 4 0 2 2  Altered sleeping  3  0   Tired, decreased energy  1  3   Change in appetite  3  0   Feeling bad or failure about yourself   1  0   Trouble concentrating  0  0   Moving slowly or fidgety/restless  1  0   Suicidal thoughts  0  0    PHQ-9 Score  13   5    Difficult doing work/chores  Very difficult        Data saved with a previous flowsheet row definition     Review of Systems  Musculoskeletal:  Positive for back pain.       Right shoulder and left hip right knee  Psychiatric/Behavioral:  Positive for dysphoric mood.   All other systems reviewed and are negative.      Objective:   Physical Exam Vitals and nursing note reviewed.  Cardiovascular:     Rate and Rhythm: Regular rhythm. Tachycardia present.     Pulses: Normal pulses.     Heart sounds: Normal heart sounds.  Pulmonary:     Effort: Pulmonary effort is normal.     Breath sounds: Normal breath sounds.  Musculoskeletal:     Comments: Normal Muscle Bulk and Muscle Testing Reveals:  Upper Extremities: Right:  Decreased ROM  30 Degrees and Muscle Strength 4/5 Right AC Joint Tenderness Left: Upper Extremity: Full ROM and Muscle Strength  5/5 Thoracic Hypersensitivity: T-1-T-6 Mainly Right Side  Lumbar Hypersensitivity Lower Extremities: Full ROM and Muscle Strength 5/5 Arises from Table slowly Antalgic  Gait     Skin:    General: Skin is warm and dry.  Neurological:     Mental Status: She is oriented to person, place, and time.  Psychiatric:        Mood and Affect: Mood normal.        Behavior: Behavior normal.           Assessment & Plan:  Spinal Stenosis, Lumbar Region: Continue HEP as Tolerated. Continue to Monitor.  Chronic Right Shoulder Pain: Continue HEP as Tolerated. Scheduled with Dr Lorilee for Right Shoulder Injection. Continue to Monitor.  Tachycardia: Apical Pulse checked. She will F/U with her PCP. Continue to Monitor.  Chronic Pain Syndrome: Refilled: Oxycodone  10/325 mg one tablet 4 times ad ay as needed for pain #120. We will continue the opioid monitoring program, this consists of regular clinic visits, examinations, urine drug screen, pill counts as well as use of [place]  Controlled Substance Reporting system. A 12 month History has been reviewed on the [place]  Controlled Substance Reporting System on 09/10/2024  F/U with Dr Lorilee

## 2024-09-15 NOTE — Telephone Encounter (Signed)
 Not autho in time

## 2024-09-16 ENCOUNTER — Telehealth: Payer: Self-pay

## 2024-09-16 NOTE — Telephone Encounter (Signed)
 PA FOR OXY 10 325 MG CREATED IN COVER MY MEDS

## 2024-09-23 NOTE — Telephone Encounter (Signed)
 Rx Oxycodone  10-325 mg approved 09/21/2024-09/21/2025.

## 2024-10-12 ENCOUNTER — Other Ambulatory Visit: Payer: Self-pay | Admitting: Registered Nurse

## 2024-10-12 DIAGNOSIS — G8929 Other chronic pain: Secondary | ICD-10-CM

## 2024-10-12 DIAGNOSIS — G894 Chronic pain syndrome: Secondary | ICD-10-CM

## 2024-10-12 DIAGNOSIS — M48062 Spinal stenosis, lumbar region with neurogenic claudication: Secondary | ICD-10-CM

## 2024-10-14 ENCOUNTER — Telehealth: Payer: Self-pay | Admitting: Registered Nurse

## 2024-10-14 DIAGNOSIS — G8929 Other chronic pain: Secondary | ICD-10-CM

## 2024-10-14 DIAGNOSIS — M48062 Spinal stenosis, lumbar region with neurogenic claudication: Secondary | ICD-10-CM

## 2024-10-14 DIAGNOSIS — G894 Chronic pain syndrome: Secondary | ICD-10-CM

## 2024-10-14 MED ORDER — OXYCODONE-ACETAMINOPHEN 10-325 MG PO TABS: 1.0000 | ORAL_TABLET | Freq: Four times a day (QID) | ORAL | 0 refills | Status: DC | PRN

## 2024-10-14 NOTE — Telephone Encounter (Signed)
 PDMP was Reviewed.  Oxycodone  e-scribed to pharmacy.

## 2024-10-15 ENCOUNTER — Ambulatory Visit: Admitting: Orthopedic Surgery

## 2024-10-15 DIAGNOSIS — M25511 Pain in right shoulder: Secondary | ICD-10-CM | POA: Diagnosis not present

## 2024-10-15 DIAGNOSIS — G8929 Other chronic pain: Secondary | ICD-10-CM

## 2024-10-15 DIAGNOSIS — G894 Chronic pain syndrome: Secondary | ICD-10-CM | POA: Diagnosis not present

## 2024-10-15 MED ORDER — METHYLPREDNISOLONE ACETATE 40 MG/ML IJ SUSP
40.0000 mg | Freq: Once | INTRAMUSCULAR | Status: AC
  Administered 2024-10-15: 15:00:00 40 mg via INTRA_ARTICULAR

## 2024-10-15 NOTE — Patient Instructions (Signed)
 You have received an injection of steroids into the joint. 15% of patients will have increased pain within the 24 hours postinjection.   This is transient and will go away.   We recommend that you use ice packs on the injection site for 20 minutes every 2 hours and extra strength Tylenol 2 tablets every 8 as needed until the pain resolves.  If you continue to have pain after taking the Tylenol and using the ice please call the office for further instructions.

## 2024-10-15 NOTE — Progress Notes (Addendum)
° °  Chief Complaint  Patient presents with   Injections    Wants injection in shoulder / right    Encounter Diagnoses  Name Primary?   Chronic right shoulder pain Yes   Chronic pain syndrome    Patient of Dr. Janae she says she gets periscapular injections  She has a proximal humerus fracture with internal fixation that is aching as well  Her symptoms to me seem like they are coming from her neck  She probably has 2 problems pain from the right shoulder fracture surgery and then cervical disc problem creating a trigger point phenomenon at the medial superior angle of the scapula  We injected the area  TRIGGER POINT INJECTION  Patient consented verbally for injection of the right posterior/MEDIAL scapula. Timeout confirmed the site of injection A steroid injection was performed at superior medial border of the right scapula at the point of maximal tenderness using 1% plain Lidocaine  and 40 mg of Depo-Medrol . This was well tolerated.

## 2024-10-15 NOTE — Progress Notes (Signed)
 SABRA

## 2024-11-10 ENCOUNTER — Other Ambulatory Visit: Payer: Self-pay | Admitting: Registered Nurse

## 2024-11-10 DIAGNOSIS — M48062 Spinal stenosis, lumbar region with neurogenic claudication: Secondary | ICD-10-CM

## 2024-11-10 DIAGNOSIS — G8929 Other chronic pain: Secondary | ICD-10-CM

## 2024-11-10 DIAGNOSIS — G894 Chronic pain syndrome: Secondary | ICD-10-CM

## 2024-11-12 ENCOUNTER — Telehealth: Payer: Self-pay | Admitting: Registered Nurse

## 2024-11-12 DIAGNOSIS — G8929 Other chronic pain: Secondary | ICD-10-CM

## 2024-11-12 DIAGNOSIS — G894 Chronic pain syndrome: Secondary | ICD-10-CM

## 2024-11-12 DIAGNOSIS — M48062 Spinal stenosis, lumbar region with neurogenic claudication: Secondary | ICD-10-CM

## 2024-11-12 MED ORDER — OXYCODONE-ACETAMINOPHEN 10-325 MG PO TABS: 1.0000 | ORAL_TABLET | Freq: Four times a day (QID) | ORAL | 0 refills | Status: DC | PRN

## 2024-11-12 NOTE — Telephone Encounter (Signed)
 PDMP was Reviewed.  Oxycodone  e-scribed to pharmacy.  Hind is aware via My Chart

## 2024-11-16 ENCOUNTER — Encounter: Attending: Registered Nurse | Admitting: Physical Medicine and Rehabilitation

## 2024-11-16 ENCOUNTER — Encounter: Payer: Self-pay | Admitting: Physical Medicine and Rehabilitation

## 2024-11-16 VITALS — BP 130/77 | HR 87 | Ht 64.0 in | Wt 126.0 lb

## 2024-11-16 DIAGNOSIS — M48062 Spinal stenosis, lumbar region with neurogenic claudication: Secondary | ICD-10-CM | POA: Diagnosis present

## 2024-11-16 DIAGNOSIS — G894 Chronic pain syndrome: Secondary | ICD-10-CM | POA: Insufficient documentation

## 2024-11-16 DIAGNOSIS — G8929 Other chronic pain: Secondary | ICD-10-CM | POA: Diagnosis present

## 2024-11-16 DIAGNOSIS — M542 Cervicalgia: Secondary | ICD-10-CM | POA: Diagnosis present

## 2024-11-16 DIAGNOSIS — M546 Pain in thoracic spine: Secondary | ICD-10-CM | POA: Diagnosis present

## 2024-11-16 DIAGNOSIS — M5010 Cervical disc disorder with radiculopathy, unspecified cervical region: Secondary | ICD-10-CM | POA: Diagnosis present

## 2024-11-16 DIAGNOSIS — M25511 Pain in right shoulder: Secondary | ICD-10-CM | POA: Diagnosis not present

## 2024-11-16 MED ORDER — OXYCODONE-ACETAMINOPHEN 10-325 MG PO TABS: 1.0000 | ORAL_TABLET | Freq: Four times a day (QID) | ORAL | 0 refills | Status: AC | PRN

## 2024-11-16 MED ORDER — BETAMETHASONE SOD PHOS & ACET 6 (3-3) MG/ML IJ SUSP
6.0000 mg | Freq: Once | INTRAMUSCULAR | Status: AC
  Administered 2024-11-16: 6 mg via INTRA_ARTICULAR

## 2024-11-16 MED ORDER — DIAZEPAM 2 MG PO TABS: 2.0000 mg | ORAL_TABLET | Freq: Once | ORAL | 0 refills | Status: AC

## 2024-11-16 MED ORDER — LIDOCAINE HCL 1 % IJ SOLN
4.0000 mL | Freq: Once | INTRAMUSCULAR | Status: AC
  Administered 2024-11-16: 4 mL

## 2024-11-16 NOTE — Addendum Note (Signed)
 Addended by: LORILEE SVEN SQUIBB on: 11/16/2024 10:02 AM   Modules accepted: Orders, Level of Service

## 2024-11-16 NOTE — Addendum Note (Signed)
 Addended by: LORILEE SVEN SQUIBB on: 11/16/2024 03:09 PM   Modules accepted: Orders

## 2024-11-16 NOTE — Addendum Note (Signed)
 Addended by: LORILEE SVEN SQUIBB on: 11/16/2024 10:05 AM   Modules accepted: Orders

## 2024-11-16 NOTE — Progress Notes (Signed)
Shoulder injection, right  Indication Right shoulder pain not relieved by medication management and other conservative care.  Informed consent was obtained after describing risks and benefits of the procedure with the patient, this includes bleeding, bruising, infection and medication side effects. The patient wishes to proceed and has given written consent. Patient was placed in a seated position. The right shoulder was marked and prepped with betadine in the subacromial area. A 25-gauge 1-1/2 inch needle was inserted into the subacromial area. After negative draw back for blood, a solution containing 1 mL of 6 mg per ML betamethasone and 4 mL of 1% lidocaine was injected. A band aid was applied. The patient tolerated the procedure well. Post procedure instructions were given.  

## 2024-11-16 NOTE — Addendum Note (Signed)
 Addended by: LORILEE SVEN SQUIBB on: 11/16/2024 10:10 AM   Modules accepted: Orders

## 2024-11-17 ENCOUNTER — Telehealth: Payer: Self-pay | Admitting: *Deleted

## 2024-11-17 NOTE — Telephone Encounter (Signed)
 Prior Auth for oxycodone  acetaminophen  10-325  sent to [facility] (Key: [facility]) [place] Case ID #: [id number] Rx #: [id number] Status Sent to Plan on January 16

## 2024-11-18 NOTE — Telephone Encounter (Signed)
 Per insurance the decision should result out tomorrow 11/19/2024. Patient has been informed.

## 2024-11-20 ENCOUNTER — Telehealth: Payer: Self-pay

## 2024-11-20 NOTE — Telephone Encounter (Signed)
 Per pharmacy Oxycodone  10-325 MG is not approved. They will given the patient a refund. Approved 09/21/2024-09/21/2025.

## 2024-11-27 ENCOUNTER — Ambulatory Visit (HOSPITAL_COMMUNITY)

## 2024-11-27 ENCOUNTER — Other Ambulatory Visit

## 2024-12-08 ENCOUNTER — Other Ambulatory Visit

## 2024-12-08 ENCOUNTER — Ambulatory Visit (HOSPITAL_COMMUNITY)

## 2024-12-11 ENCOUNTER — Encounter: Admitting: Registered Nurse

## 2025-02-11 ENCOUNTER — Encounter: Admitting: Physical Medicine and Rehabilitation
